# Patient Record
Sex: Male | Born: 1955 | Race: White | Hispanic: No | Marital: Married | Smoking: Former smoker
Health system: Southern US, Community
[De-identification: ages and names within clinical notes are randomized; demographics above are authoritative.]

## PROBLEM LIST (undated history)

## (undated) DIAGNOSIS — F319 Bipolar disorder, unspecified: Secondary | ICD-10-CM

## (undated) DIAGNOSIS — F329 Major depressive disorder, single episode, unspecified: Secondary | ICD-10-CM

## (undated) DIAGNOSIS — C029 Malignant neoplasm of tongue, unspecified: Secondary | ICD-10-CM

## (undated) DIAGNOSIS — F32A Depression, unspecified: Secondary | ICD-10-CM

## (undated) DIAGNOSIS — Z87898 Personal history of other specified conditions: Secondary | ICD-10-CM

## (undated) DIAGNOSIS — C61 Malignant neoplasm of prostate: Secondary | ICD-10-CM

## (undated) DIAGNOSIS — I7771 Dissection of carotid artery: Principal | ICD-10-CM

## (undated) DIAGNOSIS — R7309 Other abnormal glucose: Secondary | ICD-10-CM

## (undated) DIAGNOSIS — E039 Hypothyroidism, unspecified: Secondary | ICD-10-CM

## (undated) DIAGNOSIS — N400 Enlarged prostate without lower urinary tract symptoms: Secondary | ICD-10-CM

## (undated) DIAGNOSIS — K219 Gastro-esophageal reflux disease without esophagitis: Secondary | ICD-10-CM

## (undated) DIAGNOSIS — R51 Headache: Secondary | ICD-10-CM

## (undated) DIAGNOSIS — E785 Hyperlipidemia, unspecified: Secondary | ICD-10-CM

## (undated) DIAGNOSIS — G902 Horner's syndrome: Secondary | ICD-10-CM

## (undated) DIAGNOSIS — T7840XA Allergy, unspecified, initial encounter: Secondary | ICD-10-CM

## (undated) HISTORY — PX: OTHER SURGICAL HISTORY: SHX169

## (undated) HISTORY — DX: Other abnormal glucose: R73.09

## (undated) HISTORY — DX: Benign prostatic hyperplasia without lower urinary tract symptoms: N40.0

## (undated) HISTORY — DX: Malignant neoplasm of prostate: C61

## (undated) HISTORY — PX: PROSTATE BIOPSY: SHX241

## (undated) HISTORY — PX: EXCISION OF TONGUE LESION: SHX6434

## (undated) HISTORY — DX: Hyperlipidemia, unspecified: E78.5

## (undated) HISTORY — DX: Major depressive disorder, single episode, unspecified: F32.9

## (undated) HISTORY — DX: Headache: R51

## (undated) HISTORY — DX: Depression, unspecified: F32.A

## (undated) HISTORY — DX: Hypothyroidism, unspecified: E03.9

## (undated) HISTORY — DX: Allergy, unspecified, initial encounter: T78.40XA

## (undated) HISTORY — DX: Dissection of carotid artery: I77.71

## (undated) HISTORY — DX: Gastro-esophageal reflux disease without esophagitis: K21.9

## (undated) HISTORY — DX: Horner's syndrome: G90.2

## (undated) HISTORY — PX: COLONOSCOPY: SHX174

---

## 2000-10-21 ENCOUNTER — Emergency Department (HOSPITAL_COMMUNITY): Admission: EM | Admit: 2000-10-21 | Discharge: 2000-10-21 | Payer: Self-pay | Admitting: Emergency Medicine

## 2004-07-25 ENCOUNTER — Ambulatory Visit: Payer: Self-pay | Admitting: Internal Medicine

## 2004-08-01 ENCOUNTER — Ambulatory Visit: Payer: Self-pay | Admitting: Internal Medicine

## 2005-08-05 ENCOUNTER — Ambulatory Visit: Payer: Self-pay | Admitting: Internal Medicine

## 2005-08-11 ENCOUNTER — Ambulatory Visit: Payer: Self-pay | Admitting: Internal Medicine

## 2006-01-27 ENCOUNTER — Ambulatory Visit: Payer: Self-pay | Admitting: Internal Medicine

## 2006-02-02 ENCOUNTER — Ambulatory Visit: Payer: Self-pay | Admitting: Internal Medicine

## 2006-08-05 ENCOUNTER — Ambulatory Visit: Payer: Self-pay | Admitting: Internal Medicine

## 2006-08-05 LAB — CONVERTED CEMR LAB
ALT: 21 units/L (ref 0–40)
AST: 26 units/L (ref 0–37)
Alkaline Phosphatase: 50 units/L (ref 39–117)
Basophils Relative: 1.2 % — ABNORMAL HIGH (ref 0.0–1.0)
Bilirubin Urine: NEGATIVE
Chloride: 105 meq/L (ref 96–112)
Eosinophils Relative: 2.1 % (ref 0.0–5.0)
GFR calc Af Amer: 115 mL/min
GFR calc non Af Amer: 95 mL/min
HCT: 47.4 % (ref 39.0–52.0)
HDL: 34.1 mg/dL — ABNORMAL LOW (ref >39.0)
Hemoglobin, Urine: NEGATIVE
Leukocytes, UA: NEGATIVE
MCV: 94.1 fL (ref 78.0–100.0)
Monocytes Absolute: 0.5 10*3/uL (ref 0.2–0.7)
PSA: 2.33 ng/mL (ref 0.10–4.00)
Potassium: 4.5 meq/L (ref 3.5–5.1)
RBC: 5.04 M/uL (ref 4.22–5.81)
RDW: 11.7 % (ref 11.5–14.6)
Sodium: 139 meq/L (ref 135–145)
Specific Gravity, Urine: 1.015 (ref 1.000–1.03)
Total Protein, Urine: NEGATIVE mg/dL
Total Protein: 6.7 g/dL (ref 6.0–8.3)
Triglycerides: 141 mg/dL (ref 0–149)
WBC: 5.1 10*3/uL (ref 4.5–10.5)

## 2006-08-12 ENCOUNTER — Ambulatory Visit: Payer: Self-pay | Admitting: Internal Medicine

## 2007-01-29 ENCOUNTER — Ambulatory Visit: Payer: Self-pay | Admitting: Internal Medicine

## 2007-01-29 LAB — CONVERTED CEMR LAB
ALT: 19 units/L (ref 0–53)
Alkaline Phosphatase: 57 units/L (ref 39–117)
BUN: 11 mg/dL (ref 6–23)
Calcium: 9.5 mg/dL (ref 8.4–10.5)
GFR calc Af Amer: 132 mL/min
GFR calc non Af Amer: 109 mL/min
PSA: 5.15 ng/mL — ABNORMAL HIGH (ref 0.10–4.00)
Potassium: 4.4 meq/L (ref 3.5–5.1)
TSH: 2.56 microintl units/mL (ref 0.35–5.50)

## 2007-02-10 ENCOUNTER — Ambulatory Visit: Payer: Self-pay | Admitting: Internal Medicine

## 2007-08-06 ENCOUNTER — Ambulatory Visit: Payer: Self-pay | Admitting: Internal Medicine

## 2007-08-07 LAB — CONVERTED CEMR LAB
Basophils Relative: 0.5 % (ref 0.0–1.0)
Bilirubin Urine: NEGATIVE
Bilirubin, Direct: 0.2 mg/dL (ref 0.0–0.3)
CO2: 28 meq/L (ref 19–32)
Calcium: 9.9 mg/dL (ref 8.4–10.5)
Cholesterol: 153 mg/dL (ref 0–200)
Eosinophils Absolute: 0.2 10*3/uL (ref 0.0–0.6)
Eosinophils Relative: 3.6 % (ref 0.0–5.0)
GFR calc Af Amer: 132 mL/min
GFR calc non Af Amer: 109 mL/min
Glucose, Bld: 115 mg/dL — ABNORMAL HIGH (ref 70–99)
Hemoglobin: 17 g/dL (ref 13.0–17.0)
Leukocytes, UA: NEGATIVE
Lymphocytes Relative: 33.1 % (ref 12.0–46.0)
MCV: 91.4 fL (ref 78.0–100.0)
Monocytes Absolute: 0.4 10*3/uL (ref 0.2–0.7)
Neutro Abs: 3 10*3/uL (ref 1.4–7.7)
Nitrite: NEGATIVE
PSA: 2.38 ng/mL (ref 0.10–4.00)
Platelets: 204 10*3/uL (ref 150–400)
Potassium: 4.2 meq/L (ref 3.5–5.1)
Sodium: 139 meq/L (ref 135–145)
Specific Gravity, Urine: 1.01 (ref 1.000–1.03)
TSH: 1.61 microintl units/mL (ref 0.35–5.50)
Total CHOL/HDL Ratio: 5
Total Protein: 6.8 g/dL (ref 6.0–8.3)
Triglycerides: 173 mg/dL — ABNORMAL HIGH (ref 0–149)
Urine Glucose: NEGATIVE mg/dL
WBC: 5.4 10*3/uL (ref 4.5–10.5)

## 2007-08-10 ENCOUNTER — Ambulatory Visit: Payer: Self-pay | Admitting: Internal Medicine

## 2007-08-10 DIAGNOSIS — N400 Enlarged prostate without lower urinary tract symptoms: Secondary | ICD-10-CM | POA: Insufficient documentation

## 2007-08-10 DIAGNOSIS — J069 Acute upper respiratory infection, unspecified: Secondary | ICD-10-CM | POA: Insufficient documentation

## 2007-08-10 DIAGNOSIS — E039 Hypothyroidism, unspecified: Secondary | ICD-10-CM | POA: Insufficient documentation

## 2007-08-10 DIAGNOSIS — F319 Bipolar disorder, unspecified: Secondary | ICD-10-CM | POA: Insufficient documentation

## 2007-08-10 DIAGNOSIS — J309 Allergic rhinitis, unspecified: Secondary | ICD-10-CM | POA: Insufficient documentation

## 2007-08-10 DIAGNOSIS — E785 Hyperlipidemia, unspecified: Secondary | ICD-10-CM | POA: Insufficient documentation

## 2007-09-21 ENCOUNTER — Encounter: Payer: Self-pay | Admitting: Internal Medicine

## 2007-12-24 ENCOUNTER — Telehealth: Payer: Self-pay | Admitting: Internal Medicine

## 2008-02-01 ENCOUNTER — Ambulatory Visit: Payer: Self-pay | Admitting: Internal Medicine

## 2008-02-01 LAB — CONVERTED CEMR LAB
ALT: 14 units/L (ref 0–53)
AST: 27 units/L (ref 0–37)
Albumin: 4.6 g/dL (ref 3.5–5.2)
Alkaline Phosphatase: 71 units/L (ref 39–117)
BUN: 11 mg/dL (ref 6–23)
CO2: 29 meq/L (ref 19–32)
Chloride: 105 meq/L (ref 96–112)
Cholesterol: 125 mg/dL (ref 0–200)
Creatinine, Ser: 1 mg/dL (ref 0.4–1.5)
GFR calc non Af Amer: 84 mL/min
Glucose, Bld: 111 mg/dL — ABNORMAL HIGH (ref 70–99)
LDL Cholesterol: 77 mg/dL (ref 0–99)
Potassium: 4.8 meq/L (ref 3.5–5.1)
Triglycerides: 49 mg/dL (ref 0–149)

## 2008-02-07 ENCOUNTER — Ambulatory Visit: Payer: Self-pay | Admitting: Internal Medicine

## 2008-07-19 ENCOUNTER — Encounter: Payer: Self-pay | Admitting: Internal Medicine

## 2008-08-02 ENCOUNTER — Ambulatory Visit: Payer: Self-pay | Admitting: Internal Medicine

## 2008-08-03 LAB — CONVERTED CEMR LAB
ALT: 18 units/L (ref 0–53)
Alkaline Phosphatase: 63 units/L (ref 39–117)
Bilirubin Urine: NEGATIVE
Bilirubin, Direct: 0.1 mg/dL (ref 0.0–0.3)
CO2: 29 meq/L (ref 19–32)
Chloride: 109 meq/L (ref 96–112)
GFR calc Af Amer: 101 mL/min
Glucose, Bld: 114 mg/dL — ABNORMAL HIGH (ref 70–99)
HDL: 35.1 mg/dL — ABNORMAL LOW (ref >39.0)
Leukocytes, UA: NEGATIVE
Lymphocytes Relative: 22.1 % (ref 12.0–46.0)
Monocytes Relative: 7.9 % (ref 3.0–12.0)
Mucus, UA: NEGATIVE
Nitrite: NEGATIVE
Platelets: 161 10*3/uL (ref 150–400)
Potassium: 4.9 meq/L (ref 3.5–5.1)
RDW: 11.6 % (ref 11.5–14.6)
Sodium: 141 meq/L (ref 135–145)
Squamous Epithelial / LPF: NEGATIVE /lpf
Total CHOL/HDL Ratio: 3.9
Total Protein: 6.6 g/dL (ref 6.0–8.3)
VLDL: 15 mg/dL (ref 0–40)
WBC, UA: NONE SEEN cells/hpf
WBC: 3.6 10*3/uL — ABNORMAL LOW (ref 4.5–10.5)
pH: 6 (ref 5.0–8.0)

## 2008-08-08 ENCOUNTER — Ambulatory Visit: Payer: Self-pay | Admitting: Internal Medicine

## 2008-08-25 ENCOUNTER — Telehealth: Payer: Self-pay | Admitting: Internal Medicine

## 2008-09-01 ENCOUNTER — Telehealth: Payer: Self-pay | Admitting: Internal Medicine

## 2008-10-17 ENCOUNTER — Encounter: Payer: Self-pay | Admitting: Internal Medicine

## 2008-11-03 ENCOUNTER — Ambulatory Visit: Payer: Self-pay | Admitting: Internal Medicine

## 2008-11-03 LAB — CONVERTED CEMR LAB
BUN: 12 mg/dL (ref 6–23)
Basophils Absolute: 0 K/uL (ref 0.0–0.1)
Basophils Relative: 0.6 % (ref 0.0–3.0)
CO2: 27 meq/L (ref 19–32)
Calcium: 9.4 mg/dL (ref 8.4–10.5)
Chloride: 112 meq/L (ref 96–112)
Creatinine, Ser: 0.7 mg/dL (ref 0.4–1.5)
Eosinophils Absolute: 0.2 K/uL (ref 0.0–0.7)
Eosinophils Relative: 5.3 % — ABNORMAL HIGH (ref 0.0–5.0)
GFR calc non Af Amer: 126.09 mL/min (ref >60)
Glucose, Bld: 92 mg/dL (ref 70–99)
HCT: 46.8 % (ref 39.0–52.0)
Hemoglobin: 16.6 g/dL (ref 13.0–17.0)
Hgb A1c MFr Bld: 5.3 % (ref 4.6–6.5)
Lymphocytes Relative: 27.4 % (ref 12.0–46.0)
Lymphs Abs: 1.2 K/uL (ref 0.7–4.0)
MCHC: 35.4 g/dL (ref 30.0–36.0)
MCV: 93.9 fL (ref 78.0–100.0)
Monocytes Absolute: 0.4 K/uL (ref 0.1–1.0)
Monocytes Relative: 9.2 % (ref 3.0–12.0)
Neutro Abs: 2.5 K/uL (ref 1.4–7.7)
Neutrophils Relative %: 57.5 % (ref 43.0–77.0)
Platelets: 179 K/uL (ref 150.0–400.0)
Potassium: 3.9 meq/L (ref 3.5–5.1)
RBC: 4.99 M/uL (ref 4.22–5.81)
RDW: 11.5 % (ref 11.5–14.6)
Sodium: 144 meq/L (ref 135–145)
TSH: 3.83 u[IU]/mL (ref 0.35–5.50)
WBC: 4.3 10*3/microliter — ABNORMAL LOW (ref 4.5–10.5)

## 2008-11-08 ENCOUNTER — Ambulatory Visit: Payer: Self-pay | Admitting: Internal Medicine

## 2009-01-11 ENCOUNTER — Encounter: Payer: Self-pay | Admitting: Internal Medicine

## 2009-03-02 ENCOUNTER — Ambulatory Visit: Payer: Self-pay | Admitting: Internal Medicine

## 2009-03-02 LAB — CONVERTED CEMR LAB
BUN: 12 mg/dL (ref 6–23)
Calcium: 9.4 mg/dL (ref 8.4–10.5)
Creatinine, Ser: 0.8 mg/dL (ref 0.4–1.5)
GFR calc non Af Amer: 107.94 mL/min (ref >60)
Glucose, Bld: 101 mg/dL — ABNORMAL HIGH (ref 70–99)
Total CHOL/HDL Ratio: 5

## 2009-03-07 ENCOUNTER — Ambulatory Visit: Payer: Self-pay | Admitting: Internal Medicine

## 2009-03-07 DIAGNOSIS — R498 Other voice and resonance disorders: Secondary | ICD-10-CM | POA: Insufficient documentation

## 2009-03-28 ENCOUNTER — Ambulatory Visit: Payer: Self-pay | Admitting: Internal Medicine

## 2009-03-28 DIAGNOSIS — R194 Change in bowel habit: Secondary | ICD-10-CM | POA: Insufficient documentation

## 2009-03-28 DIAGNOSIS — R197 Diarrhea, unspecified: Secondary | ICD-10-CM | POA: Insufficient documentation

## 2009-05-23 ENCOUNTER — Telehealth: Payer: Self-pay | Admitting: Internal Medicine

## 2009-07-14 HISTORY — PX: OTHER SURGICAL HISTORY: SHX169

## 2009-07-16 ENCOUNTER — Encounter: Payer: Self-pay | Admitting: Internal Medicine

## 2009-08-29 ENCOUNTER — Encounter: Payer: Self-pay | Admitting: Internal Medicine

## 2009-09-05 ENCOUNTER — Ambulatory Visit: Payer: Self-pay | Admitting: Internal Medicine

## 2009-09-07 LAB — CONVERTED CEMR LAB: Vitamin B-12: 563 pg/mL (ref 211–911)

## 2009-11-21 ENCOUNTER — Encounter: Payer: Self-pay | Admitting: Internal Medicine

## 2010-01-02 ENCOUNTER — Encounter: Payer: Self-pay | Admitting: Internal Medicine

## 2010-01-10 ENCOUNTER — Telehealth: Payer: Self-pay | Admitting: Internal Medicine

## 2010-01-29 ENCOUNTER — Encounter: Payer: Self-pay | Admitting: Internal Medicine

## 2010-02-26 ENCOUNTER — Ambulatory Visit: Payer: Self-pay | Admitting: Internal Medicine

## 2010-02-27 LAB — CONVERTED CEMR LAB
Albumin: 4.9 g/dL (ref 3.5–5.2)
Alkaline Phosphatase: 73 units/L (ref 39–117)
Basophils Absolute: 0 10*3/uL (ref 0.0–0.1)
Bilirubin Urine: NEGATIVE
CO2: 30 meq/L (ref 19–32)
Calcium: 9.7 mg/dL (ref 8.4–10.5)
Cholesterol: 232 mg/dL — ABNORMAL HIGH (ref 0–200)
Creatinine, Ser: 0.8 mg/dL (ref 0.4–1.5)
Direct LDL: 168.1 mg/dL
Eosinophils Absolute: 0.1 10*3/uL (ref 0.0–0.7)
Glucose, Bld: 95 mg/dL (ref 70–99)
Hemoglobin, Urine: NEGATIVE
Hemoglobin: 16.7 g/dL (ref 13.0–17.0)
Leukocytes, UA: NEGATIVE
Lymphocytes Relative: 28.3 % (ref 12.0–46.0)
MCHC: 35.2 g/dL (ref 30.0–36.0)
MCV: 94.9 fL (ref 78.0–100.0)
Monocytes Absolute: 0.4 10*3/uL (ref 0.1–1.0)
Neutro Abs: 2.9 10*3/uL (ref 1.4–7.7)
Neutrophils Relative %: 61 % (ref 43.0–77.0)
Nitrite: NEGATIVE
PSA: 6.5 ng/mL — ABNORMAL HIGH (ref 0.10–4.00)
RDW: 12.6 % (ref 11.5–14.6)
Total Protein, Urine: NEGATIVE mg/dL
Triglycerides: 107 mg/dL (ref 0.0–149.0)
Urobilinogen, UA: 0.2 (ref 0.0–1.0)

## 2010-03-06 ENCOUNTER — Ambulatory Visit: Payer: Self-pay | Admitting: Internal Medicine

## 2010-03-06 ENCOUNTER — Encounter: Payer: Self-pay | Admitting: Internal Medicine

## 2010-03-06 DIAGNOSIS — M25511 Pain in right shoulder: Secondary | ICD-10-CM | POA: Insufficient documentation

## 2010-03-07 ENCOUNTER — Telehealth: Payer: Self-pay | Admitting: Internal Medicine

## 2010-04-08 ENCOUNTER — Ambulatory Visit: Payer: Self-pay | Admitting: Internal Medicine

## 2010-04-08 DIAGNOSIS — M25579 Pain in unspecified ankle and joints of unspecified foot: Secondary | ICD-10-CM | POA: Insufficient documentation

## 2010-04-08 DIAGNOSIS — R609 Edema, unspecified: Secondary | ICD-10-CM | POA: Insufficient documentation

## 2010-04-09 LAB — CONVERTED CEMR LAB
BUN: 8 mg/dL (ref 6–23)
Glucose, Bld: 109 mg/dL — ABNORMAL HIGH (ref 70–99)
Ketones, ur: NEGATIVE mg/dL
Leukocytes, UA: NEGATIVE
Specific Gravity, Urine: 1.01 (ref 1.000–1.030)
TSH: 0.93 microintl units/mL (ref 0.35–5.50)
Uric Acid, Serum: 4.3 mg/dL (ref 4.0–7.8)
Urobilinogen, UA: 0.2 (ref 0.0–1.0)

## 2010-04-22 ENCOUNTER — Encounter: Payer: Self-pay | Admitting: Internal Medicine

## 2010-05-01 ENCOUNTER — Encounter: Payer: Self-pay | Admitting: Internal Medicine

## 2010-07-01 ENCOUNTER — Encounter: Payer: Self-pay | Admitting: Internal Medicine

## 2010-07-05 ENCOUNTER — Telehealth: Payer: Self-pay | Admitting: Internal Medicine

## 2010-07-10 ENCOUNTER — Ambulatory Visit: Payer: Self-pay | Admitting: Internal Medicine

## 2010-07-10 DIAGNOSIS — R972 Elevated prostate specific antigen [PSA]: Secondary | ICD-10-CM

## 2010-07-31 ENCOUNTER — Telehealth: Payer: Self-pay | Admitting: Internal Medicine

## 2010-08-01 ENCOUNTER — Telehealth: Payer: Self-pay | Admitting: Internal Medicine

## 2010-08-13 NOTE — Letter (Signed)
Summary: Voice Disorders/[facility]  Voice Disorders/[facility]   Imported By: Lester [place]boro 05/16/2010 08:54:44  _____________________________________________________________________  External Attachment:    Type:   Image     Comment:   External Document

## 2010-08-13 NOTE — Letter (Signed)
Summary: [facility]   Imported By: Lester Edgar 07/27/2009 08:32:09  _____________________________________________________________________  External Attachment:    Type:   Image     Comment:   External Document

## 2010-08-13 NOTE — Letter (Signed)
Summary: [facility]   Imported By: Lester Coyanosa 05/15/2010 10:46:42  _____________________________________________________________________  External Attachment:    Type:   Image     Comment:   External Document

## 2010-08-13 NOTE — Letter (Signed)
Summary: [facility]  [facility]   Imported By: Sherian Rein 02/12/2010 14:09:31  _____________________________________________________________________  External Attachment:    Type:   Image     Comment:   External Document

## 2010-08-13 NOTE — Letter (Signed)
Summary: Voice Eval/[facility]  Voice Eval/[facility]   Imported By: Sherian Rein 03/12/2010 13:39:32  _____________________________________________________________________  External Attachment:    Type:   Image     Comment:   External Document

## 2010-08-13 NOTE — Letter (Signed)
Summary: [facility]   Imported By: Sherian Rein 03/12/2010 13:31:49  _____________________________________________________________________  External Attachment:    Type:   Image     Comment:   External Document

## 2010-08-13 NOTE — Assessment & Plan Note (Signed)
Summary: 6 mos well w/labs/#/cd   Vital Signs:  Patient profile:   55 year old male Height:      73 inches Weight:      172 pounds BMI:     22.77 Temp:     98.0 degrees F oral Pulse rate:   84 / minute Pulse rhythm:   regular Resp:     16 per minute BP sitting:   130 / 80  (left arm) Cuff size:   regular  Vitals Entered By: Lanier Prude, Beverly Gust) (March 06, 2010 3:06 PM) CC: CPX Is Patient Diabetic? No Comments pt is not taking Depakote, Flax Seed oil or Ambien.  Please remove from list   Primary Care Provider:  Tresa Garter MD  CC:  CPX.  History of Present Illness: The patient presents for a follow up of hypertension, diabetes, hyperlipidemia The patient presents for a preventive health examination   Preventive Screening-Counseling & Management  Caffeine-Diet-Exercise     Does Patient Exercise: yes  Current Medications (verified): 1)  Levothroid 50 Mcg Tabs (Levothyroxine Sodium) .... Take 1 Tab By Mouth Daily 2)  Aspir-Low 81 Mg Tbec (Aspirin) .Marland Kitchen.. 1 Once Daily After Meal 3)  Multi-Vitamin  Tabs (Multiple Vitamin) .... Once Daily 4)  Veramyst 27.5 Mcg/spray Susp (Fluticasone Furoate) .Marland Kitchen.. 1 Spr Each Nostr Qd 5)  Depakote 250 Mg Tbec (Divalproex Sodium) .... Three Times A Day 6)  Flax Seeds  Powd (Flaxseed (Linseed)) .Marland Kitchen.. 1  Tbsp Once Daily 7)  Singulair 10 Mg Tabs (Montelukast Sodium) .Marland Kitchen.. 1 By Mouth Daily 8)  Vitamin D3 1000 Unit  Tabs (Cholecalciferol) .Marland Kitchen.. 1 By Mouth Daily 9)  Welchol 625 Mg Tabs (Colesevelam Hcl) .Marland Kitchen.. 1-2 As Needed Diarrhea 10)  Ambien Cr 12.5 Mg Cr-Tabs (Zolpidem Tartrate) .... At Bedtime 11)  Wellbutrin Xl 150 Mg Xr24h-Tab (Bupropion Hcl) .... 2  Every Morning 12)  Amantadine Hcl 100 Mg Tabs (Amantadine Hcl) .Marland Kitchen.. 1 Once Daily 13)  Lamictal 200 Mg Tabs (Lamotrigine) .... At Bedtime 14)  Klonopin 2 Mg Tabs (Clonazepam) .... At Bedtime 15)  Zaleplon 5 Mg Caps (Zaleplon) .... At Bedtime  Allergies (verified): 1)  ! Keflex 2)  !  Prozac (Fluoxetine Hcl) 3)  Zithromax 4)  Toradol 5)  Erythromycin  Past History:  Past Surgical History: Last updated: 08/08/2008 Prostate bx 2010  Family History: Last updated: 08/10/2007 Family History Hypertension  Past Medical History: Hypothyroidism Benign prostatic hypertrophy, elev PSA resolved after Abx 2008 Dr Retta Diones (PSA 5.15 in 8/09) Depression, h/o bipolar Dr Robley Fries at [facility] Hyperlipidemia Elev. glu Allergic rhinitis  Social History: Occupation:  Married Never Smoked Regular exercise-yes treadmill Does Patient Exercise:  yes  Review of Systems       The patient complains of depression.  The patient denies anorexia, fever, weight loss, weight gain, vision loss, decreased hearing, hoarseness, chest pain, syncope, dyspnea on exertion, peripheral edema, prolonged cough, headaches, hemoptysis, abdominal pain, melena, hematochezia, severe indigestion/heartburn, hematuria, incontinence, genital sores, muscle weakness, suspicious skin lesions, transient blindness, difficulty walking, unusual weight change, abnormal bleeding, enlarged lymph nodes, angioedema, and breast masses.         hoarse  Physical Exam  General:  Well-developed,well-nourished,in no acute distress; alert,appropriate and cooperative throughout examination Hoarse Head:  Normocephalic and atraumatic without obvious abnormalities. No apparent alopecia or balding. Eyes:  No corneal or conjunctival inflammation noted. EOMI. Perrla.  Ears:  External ear exam shows no significant lesions or deformities.  Otoscopic examination reveals clear canals, tympanic membranes are intact  bilaterally without bulging, retraction, inflammation or discharge. Hearing is grossly normal bilaterally. Nose:  External nasal examination shows no deformity or inflammation. Nasal mucosa are pink and moist without lesions or exudates. Mouth:  WNL Hoarse Neck:  No deformities, masses, or tenderness noted. Chest Wall:  No  deformities, masses, tenderness or gynecomastia noted. Lungs:  Normal respiratory effort, chest expands symmetrically. Lungs are clear to auscultation, no crackles or wheezes. Heart:  Normal rate and regular rhythm. S1 and S2 normal without gallop, murmur, click, rub or other extra sounds. Abdomen:  Bowel sounds positive,abdomen soft and non-tender without masses, organomegaly or hernias noted. Rectal:  No external abnormalities noted. Normal sphincter tone. No rectal masses or tenderness. Genitalia:  Testes bilaterally descended without nodularity, tenderness or masses. No scrotal masses or lesions. No penis lesions or urethral discharge. Prostate:  Prostate gland firm and smooth, no enlargement, nodularity, tenderness, mass, asymmetry or induration. Msk:  No deformity or scoliosis noted of thoracic or lumbar spine.   Pulses:  R and L carotid,radial,femoral,dorsalis pedis and posterior tibial pulses are full and equal bilaterally Extremities:  No clubbing, cyanosis, edema, or deformity noted with normal full range of motion of all joints.   Neurologic:  No cranial nerve deficits noted. Station and gait are normal. Plantar reflexes are down-going bilaterally. DTRs are symmetrical throughout. Sensory, motor and coordinative functions appear intact. Skin:  Intact without suspicious lesions or rashes Cervical Nodes:  No lymphadenopathy noted Inguinal Nodes:  No significant adenopathy Psych:  Oriented X3, memory weak for recent, normally interactive, and good eye contact.     Impression & Recommendations:  Problem # 1:  ROUTINE GENERAL MEDICAL EXAM@HEALTH  CARE FACL (ICD-V70.0) Assessment Unchanged Health and age related issues were discussed. Available screening tests and vaccinations were discussed as well. Healthy life style including good diet and exercise was discussed.  The labs were reviewed with the patient.  Orders: EKG w/ Interpretation (93000) WNL  Problem # 2:  DEPRESSION  (ICD-311) Assessment: Improved  His updated medication list for this problem includes:    Wellbutrin Xl 150 Mg Xr24h-tab (Bupropion hcl) .Marland Kitchen... 2  every morning    Klonopin 2 Mg Tabs (Clonazepam) .Marland Kitchen... At bedtime  Problem # 3:  ALLERGIC RHINITIS (ICD-477.9) Assessment: Unchanged  His updated medication list for this problem includes:    Veramyst 27.5 Mcg/spray Susp (Fluticasone furoate) .Marland Kitchen... 1 spr each nostr qd  Problem # 4:  HOARSENESS (TDD-220.25) Assessment: Unchanged Surgery is pending   Problem # 5:  HYPERLIPIDEMIA (ICD-272.4) Assessment: Unchanged  His updated medication list for this problem includes:    Welchol 625 Mg Tabs (Colesevelam hcl) .Marland Kitchen... 1-2 as needed diarrhea  Problem # 6:  SHOULDER PAIN (ICD-719.41) R>L Assessment: Unchanged  His updated medication list for this problem includes:    Aspir-low 81 Mg Tbec (Aspirin) .Marland Kitchen... 1 once daily after meal    Vimovo 500-20 Mg Tbec (Naproxen-esomeprazole) .Marland Kitchen... 1 by mouth once daily - two times a day pc as needed pain  Complete Medication List: 1)  Levothroid 50 Mcg Tabs (Levothyroxine sodium) .... Take 1 tab by mouth daily 2)  Aspir-low 81 Mg Tbec (Aspirin) .Marland Kitchen.. 1 once daily after meal 3)  Multi-vitamin Tabs (Multiple vitamin) .... Once daily 4)  Veramyst 27.5 Mcg/spray Susp (Fluticasone furoate) .Marland Kitchen.. 1 spr each nostr qd 5)  Flax Seeds Powd (Flaxseed (linseed)) .Marland Kitchen.. 1  tbsp once daily 6)  Singulair 10 Mg Tabs (Montelukast sodium) .Marland Kitchen.. 1 by mouth daily 7)  Vitamin D3 1000 Unit Tabs (Cholecalciferol) .Marland KitchenMarland KitchenMarland Kitchen  1 by mouth daily 8)  Welchol 625 Mg Tabs (Colesevelam hcl) .Marland Kitchen.. 1-2 as needed diarrhea 9)  Wellbutrin Xl 150 Mg Xr24h-tab (Bupropion hcl) .... 2  every morning 10)  Amantadine Hcl 100 Mg Tabs (Amantadine hcl) .Marland Kitchen.. 1 once daily 11)  Lamictal 200 Mg Tabs (Lamotrigine) .... At bedtime 12)  Klonopin 2 Mg Tabs (Clonazepam) .... At bedtime 13)  Zaleplon 5 Mg Caps (Zaleplon) .... At bedtime 14)  Vimovo 500-20 Mg Tbec  (Naproxen-esomeprazole) .Marland Kitchen.. 1 by mouth once daily - two times a day pc as needed pain 15)  Levaquin 500 Mg Tabs (Levofloxacin) .Marland Kitchen.. 1 by mouth qd  Other Orders: Admin 1st Vaccine (62952) Flu Vaccine 28yrs + (84132)  Patient Instructions: 1)  Countor pillow 2)  Use stretching and balance exercises that I have provided (15 min. or longer every day) 3)  Please schedule a follow-up appointment in 4 months. Prescriptions: VIMOVO 500-20 MG TBEC (NAPROXEN-ESOMEPRAZOLE) 1 by mouth once daily - two times a day pc as needed pain  #60 x 3   Entered and Authorized by:   Tresa Garter MD   Signed by:   Tresa Garter MD on 03/06/2010   Method used:   Print then Give to Patient   RxID:   [id number] ZITHROMAX Z-PAK 250 MG TABS (AZITHROMYCIN) as dirrected  #1 x 0   Entered and Authorized by:   Tresa Garter MD   Signed by:   Tresa Garter MD on 03/06/2010   Method used:   Electronically to        [facility]. # [phone]* (retail)       [address]       [place], [place]  [address]       Ph: [phone]       Fax: [phone]   RxID:   [id number] VERAMYST 27.5 MCG/SPRAY SUSP (FLUTICASONE FUROATE) 1 spr each nostr qd  #3 x 2   Entered and Authorized by:   Tresa Garter MD   Signed by:   Tresa Garter MD on 03/06/2010   Method used:   Electronically to        [facility]. # [phone]* (retail)       [address]       [place], [place]  [address]       Ph: [phone]       Fax: [phone]   RxID:   [id number]   .lbflu    Flu Vaccine Consent Questions     Do you have a history of severe allergic reactions to this vaccine? no    Any prior history of allergic reactions to egg and/or gelatin? no    Do you have a sensitivity to the preservative Thimersol? no    Do you have a past history of Guillan-Barre Syndrome? no    Do you currently have an acute febrile illness? no    Have you ever had a severe  reaction to latex? no    Vaccine information given and explained to patient? yes    Are you currently pregnant? no    Lot Number:[id number]   Exp Date:01/11/2011   Site Given  Left Deltoid IM Lanier Prude, [facility])  March 06, 2010 4:13 PM

## 2010-08-13 NOTE — Consult Note (Signed)
Summary: [facility]  [facility]   Imported By: Lennie Odor 12/18/2009 15:32:23  _____________________________________________________________________  External Attachment:    Type:   Image     Comment:   External Document

## 2010-08-13 NOTE — Assessment & Plan Note (Signed)
Summary: 6 MO ROV /NWS $50   Vital Signs:  Patient profile:   55 year old male Weight:      184 pounds BMI:     24.36 Temp:     98.4 degrees F oral Pulse rate:   91 / minute BP sitting:   110 / 80  (left arm)  Vitals Entered By: Tora Perches (September 05, 2009 2:31 PM) CC: f/u Is Patient Diabetic? No   Primary Care Provider:  Tresa Garter MD  CC:  f/u.  History of Present Illness: The patient presents for a follow up of hypothyroidism, anxiety, bipolar depression and allergies.   Preventive Screening-Counseling & Management  Alcohol-Tobacco     Smoking Status: never  Current Medications (verified): 1)  Levothroid 50 Mcg Tabs (Levothyroxine Sodium) .... Take 1 Tab By Mouth Daily 2)  Aspir-Low 81 Mg Tbec (Aspirin) .Marland Kitchen.. 1 Once Daily After Meal 3)  Multi-Vitamin  Tabs (Multiple Vitamin) .... Once Daily 4)  Veramyst 27.5 Mcg/spray Susp (Fluticasone Furoate) .Marland Kitchen.. 1 Spr Each Nostr Qd 5)  Depakote 250 Mg Tbec (Divalproex Sodium) .... Three Times A Day 6)  Flax Seeds  Powd (Flaxseed (Linseed)) .Marland Kitchen.. 1  Tbsp Once Daily 7)  Singulair 10 Mg Tabs (Montelukast Sodium) .Marland Kitchen.. 1 By Mouth Daily 8)  Vitamin D3 1000 Unit  Tabs (Cholecalciferol) .Marland Kitchen.. 1 By Mouth Daily 9)  Welchol 625 Mg Tabs (Colesevelam Hcl) .Marland Kitchen.. 1-2 As Needed Diarrhea 10)  Ambien Cr 12.5 Mg Cr-Tabs (Zolpidem Tartrate) .... At Bedtime  Allergies: 1)  ! Keflex 2)  ! Prozac (Fluoxetine Hcl) 3)  Zithromax 4)  Toradol 5)  Erythromycin  Past History:  Past Medical History: Last updated: 11/08/2008 Hypothyroidism Benign prostatic hypertrophy, elev PSA resolved after Abx 2008 Dr Retta Diones (PSA 5.15 in 8/09) Depression, h/o bipolar Dr Donell Beers Hyperlipidemia Elev. glu Allergic rhinitis  Social History: Last updated: 03/07/2009 Occupation: changed jobs Married Never Smoked  Physical Exam  General:  Well-developed,well-nourished,in no acute distress; alert,appropriate and cooperative throughout  examination Hoarse Nose:  External nasal examination shows no deformity or inflammation. Nasal mucosa are pink and moist without lesions or exudates. Mouth:  WNL Neck:  No deformities, masses, or tenderness noted. Lungs:  Normal respiratory effort, chest expands symmetrically. Lungs are clear to auscultation, no crackles or wheezes. Heart:  Normal rate and regular rhythm. S1 and S2 normal without gallop, murmur, click, rub or other extra sounds. Abdomen:  Bowel sounds positive,abdomen soft and non-tender without masses, organomegaly or hernias noted. Msk:  No deformity or scoliosis noted of thoracic or lumbar spine.   Neurologic:  No cranial nerve deficits noted. Station and gait are normal. Plantar reflexes are down-going bilaterally. DTRs are symmetrical throughout. Sensory, motor and coordinative functions appear intact. Skin:  Intact without suspicious lesions or rashes Psych:  Oriented X3, memory weak for recent, normally interactive, and good eye contact.     Impression & Recommendations:  Problem # 1:  DEPRESSION (ICD-311) Assessment Unchanged  The following medications were removed from the medication list:    Zoloft 100 Mg Tabs (Sertraline hcl) .Marland Kitchen... 1 1/2 once daily  Problem # 2:  HOARSENESS (ACZ-660.63) Assessment: New  He had seen ENT  Orders: TLB-B12, Serum-Total ONLY (01601-U93) TLB-TSH (Thyroid Stimulating Hormone) (84443-TSH)  Problem # 3:  HYPERLIPIDEMIA (ICD-272.4) Assessment: Comment Only  His updated medication list for this problem includes:    Welchol 625 Mg Tabs (Colesevelam hcl) .Marland Kitchen... 1-2 as needed diarrhea  Orders: TLB-B12, Serum-Total ONLY (23557-D22) TLB-TSH (Thyroid Stimulating Hormone) (84443-TSH)  Problem # 4:  HYPOTHYROIDISM (ICD-244.9) Assessment: Unchanged  His updated medication list for this problem includes:    Levothroid 50 Mcg Tabs (Levothyroxine sodium) .Marland Kitchen... Take 1 tab by mouth daily  Orders: TLB-B12, Serum-Total ONLY  (16109-U04) TLB-TSH (Thyroid Stimulating Hormone) (84443-TSH)  Complete Medication List: 1)  Levothroid 50 Mcg Tabs (Levothyroxine sodium) .... Take 1 tab by mouth daily 2)  Aspir-low 81 Mg Tbec (Aspirin) .Marland Kitchen.. 1 once daily after meal 3)  Multi-vitamin Tabs (Multiple vitamin) .... Once daily 4)  Veramyst 27.5 Mcg/spray Susp (Fluticasone furoate) .Marland Kitchen.. 1 spr each nostr qd 5)  Depakote 250 Mg Tbec (Divalproex sodium) .... Three times a day 6)  Flax Seeds Powd (Flaxseed (linseed)) .Marland Kitchen.. 1  tbsp once daily 7)  Singulair 10 Mg Tabs (Montelukast sodium) .Marland Kitchen.. 1 by mouth daily 8)  Vitamin D3 1000 Unit Tabs (Cholecalciferol) .Marland Kitchen.. 1 by mouth daily 9)  Welchol 625 Mg Tabs (Colesevelam hcl) .Marland Kitchen.. 1-2 as needed diarrhea 10)  Ambien Cr 12.5 Mg Cr-tabs (Zolpidem tartrate) .... At bedtime  Other Orders: Admin 1st Vaccine (54098) Flu Vaccine 46yrs + (11914)  Patient Instructions: 1)  Please schedule a follow-up appointment in 6 months well w/labs and depakote level v70.0  995.20.   Influenza Vaccine (to be given today)   Flu Vaccine Consent Questions     Do you have a history of severe allergic reactions to this vaccine? no    Any prior history of allergic reactions to egg and/or gelatin? no    Do you have a sensitivity to the preservative Thimersol? no    Do you have a past history of Guillan-Barre Syndrome? no    Do you currently have an acute febrile illness? no    Have you ever had a severe reaction to latex? no    Vaccine information given and explained to patient? yes    Are you currently pregnant? no    Lot Number:[id number]   Exp Date:01/10/2010   Site Given  right Deltoid IMbflu

## 2010-08-13 NOTE — Progress Notes (Signed)
Summary: Change in rx-- lm 8/25  Phone Note Call from Patient Call back at Home Phone [phone]   Summary of Call: Pt remembered that he has severe diarrhea when he takes a zpak. His throat is worse and would like rx for different antibiotic at [facility] in [place].  Initial call taken by: Lamar Sprinkles, CMA,  March 07, 2010 11:50 AM  Follow-up for Phone Call        Levaquin x 7 d  Follow-up by: Tresa Garter MD,  March 07, 2010 1:34 PM  Additional Follow-up for Phone Call Additional follow up Details #1::        Received  call from patient pharmacy that there is severe interaction between Z-Pack and Levaquin. I called to advise the patient of that and the above, left message on machine to call back to office. Lucious Groves CMA  March 07, 2010 2:10 PM     Additional Follow-up for Phone Call Additional follow up Details #2::    Pt informed, zpak added to allergy list, pt will not be taking both Follow-up by: Lamar Sprinkles, CMA,  March 07, 2010 5:27 PM  New/Updated Medications: LEVAQUIN 500 MG TABS (LEVOFLOXACIN) 1 by mouth qd Prescriptions: LEVAQUIN 500 MG TABS (LEVOFLOXACIN) 1 by mouth qd  #7 x 0   Entered and Authorized by:   Tresa Garter MD   Signed by:   Tresa Garter MD on 03/07/2010   Method used:   Electronically to        [facility]. # [phone]* (retail)       [address]       [place], [place]  [address]       Ph: [phone]       Fax: [phone]   RxID:   [id number]

## 2010-08-13 NOTE — Progress Notes (Signed)
  Phone Note Refill Request   Refills Requested: Medication #1:  LEVOTHROID 50 MCG TABS Take 1 tab by mouth daily  Medication #2:  SINGULAIR 10 MG TABS 1 by mouth daily  Needs refills to go to [facility]  Initial call taken by: Lamar Sprinkles, CMA,  January 10, 2010 9:58 AM    Prescriptions: SINGULAIR 10 MG TABS (MONTELUKAST SODIUM) 1 by mouth daily  #90 x 2   Entered by:   Lamar Sprinkles, CMA   Authorized by:   Tresa Garter MD   Signed by:   Lamar Sprinkles, CMA on 01/10/2010   Method used:   Faxed to ...       [facility] (mail-order)             , [place]         Ph: [phone]       Fax: [phone]   RxID:   [id number] LEVOTHROID 50 MCG TABS (LEVOTHYROXINE SODIUM) Take 1 tab by mouth daily  #90 x 2   Entered by:   Lamar Sprinkles, CMA   Authorized by:   Tresa Garter MD   Signed by:   Lamar Sprinkles, CMA on 01/10/2010   Method used:   Faxed to ...       [facility] (mail-order)             , [place]         Ph: [phone]       Fax: [phone]   RxID:   [id number]

## 2010-08-13 NOTE — Assessment & Plan Note (Signed)
Summary: PER WIFE NXT WK APPT--ANKLES SWOLLEN--STC   Vital Signs:  Patient profile:   55 year old male Height:      73 inches Weight:      170 pounds BMI:     22.51 Temp:     98.7 degrees F oral Pulse rate:   92 / minute Pulse rhythm:   regular Resp:     16 per minute BP sitting:   120 / 84  (left arm) Cuff size:   regular  Vitals Entered By: Lanier Prude, CMA(AAMA) (April 08, 2010 10:02 AM) CC: bilateral ankle/feet swelling X 2 wks Is Patient Diabetic? No   Primary Care Provider:  Georgina Quint Emmy Keng MD  CC:  bilateral ankle/feet swelling X 2 wks.  History of Present Illness: C/o LE edema x 1 wk; no pain in LE. Better  w/elev and compr socks. Some  L ankle pain. No injury.  Current Medications (verified): 1)  Levothroid 50 Mcg Tabs (Levothyroxine Sodium) .... Take 1 Tab By Mouth Daily 2)  Aspir-Low 81 Mg Tbec (Aspirin) .Marland Kitchen.. 1 Once Daily After Meal 3)  Multi-Vitamin  Tabs (Multiple Vitamin) .... Once Daily 4)  Veramyst 27.5 Mcg/spray Susp (Fluticasone Furoate) .Marland Kitchen.. 1 Spr Each Nostr Qd 5)  Flax Seeds  Powd (Flaxseed (Linseed)) .Marland Kitchen.. 1  Tbsp Once Daily 6)  Singulair 10 Mg Tabs (Montelukast Sodium) .Marland Kitchen.. 1 By Mouth Daily 7)  Vitamin D3 1000 Unit  Tabs (Cholecalciferol) .Marland Kitchen.. 1 By Mouth Daily 8)  Welchol 625 Mg Tabs (Colesevelam Hcl) .Marland Kitchen.. 1-2 As Needed Diarrhea 9)  Wellbutrin Xl 150 Mg Xr24h-Tab (Bupropion Hcl) .... 2  Every Morning 10)  Amantadine Hcl 100 Mg Tabs (Amantadine Hcl) .Marland Kitchen.. 1 Once Daily 11)  Lamictal 200 Mg Tabs (Lamotrigine) .... At Bedtime 12)  Klonopin 2 Mg Tabs (Clonazepam) .... At Bedtime 13)  Zaleplon 5 Mg Caps (Zaleplon) .... At Bedtime 14)  Vimovo 500-20 Mg Tbec (Naproxen-Esomeprazole) .Marland Kitchen.. 1 By Mouth Once Daily - Two Times A Day Pc As Needed Pain  Allergies (verified): 1)  ! Keflex 2)  ! Prozac (Fluoxetine Hcl) 3)  Zithromax 4)  Toradol 5)  Erythromycin  Past History:  Past Medical History: Last updated: 03/06/2010 Hypothyroidism Benign  prostatic hypertrophy, elev PSA resolved after Abx 2008 Dr Retta Diones (PSA 5.15 in 8/09) Depression, h/o bipolar Dr Robley Fries at [facility] Hyperlipidemia Elev. glu Allergic rhinitis  Social History: Last updated: 03/06/2010 Occupation:  Married Never Smoked Regular exercise-yes treadmill  Review of Systems  The patient denies chest pain, syncope, and dyspnea on exertion.    Physical Exam  General:  Well-developed,well-nourished,in no acute distress; alert,appropriate and cooperative throughout examination Hoarse Mouth:  WNL Hoarse Lungs:  Normal respiratory effort, chest expands symmetrically. Lungs are clear to auscultation, no crackles or wheezes. Heart:  Normal rate and regular rhythm. S1 and S2 normal without gallop, murmur, click, rub or other extra sounds. Abdomen:  Bowel sounds positive,abdomen soft and non-tender without masses, organomegaly or hernias noted. Msk:  No deformity or scoliosis noted of thoracic or lumbar spine.   Extremities:  no edema B   Impression & Recommendations:  Problem # 1:  ANKLE PAIN (ICD-719.47) R Assessment New  Orders: TLB-BMP (Basic Metabolic Panel-BMET) (80048-METABOL) TLB-Sedimentation Rate (ESR) (85652-ESR) TLB-Uric Acid, Blood (84550-URIC) TLB-TSH (Thyroid Stimulating Hormone) (84443-TSH) TLB-Udip ONLY (81003-UDIP)  Problem # 2:  EDEMA (ICD-782.3) B Assessment: New  His updated medication list for this problem includes:    Furosemide 20 Mg Tabs (Furosemide) .Marland Kitchen... 1 by mouth qam as needed  for swelling as needed  Orders: TLB-BMP (Basic Metabolic Panel-BMET) (80048-METABOL) TLB-Sedimentation Rate (ESR) (85652-ESR) TLB-Uric Acid, Blood (84550-URIC) TLB-TSH (Thyroid Stimulating Hormone) (84443-TSH) TLB-Udip ONLY (81003-UDIP)  Complete Medication List: 1)  Levothroid 50 Mcg Tabs (Levothyroxine sodium) .... Take 1 tab by mouth daily 2)  Aspir-low 81 Mg Tbec (Aspirin) .Marland Kitchen.. 1 once daily after meal 3)  Multi-vitamin Tabs (Multiple  vitamin) .... Once daily 4)  Veramyst 27.5 Mcg/spray Susp (Fluticasone furoate) .Marland Kitchen.. 1 spr each nostr qd 5)  Flax Seeds Powd (Flaxseed (linseed)) .Marland Kitchen.. 1  tbsp once daily 6)  Singulair 10 Mg Tabs (Montelukast sodium) .Marland Kitchen.. 1 by mouth daily 7)  Vitamin D3 1000 Unit Tabs (Cholecalciferol) .Marland Kitchen.. 1 by mouth daily 8)  Welchol 625 Mg Tabs (Colesevelam hcl) .Marland Kitchen.. 1-2 as needed diarrhea 9)  Wellbutrin Xl 150 Mg Xr24h-tab (Bupropion hcl) .... 2  every morning 10)  Amantadine Hcl 100 Mg Tabs (Amantadine hcl) .Marland Kitchen.. 1 once daily 11)  Lamictal 200 Mg Tabs (Lamotrigine) .... At bedtime 12)  Klonopin 2 Mg Tabs (Clonazepam) .... At bedtime 13)  Zaleplon 5 Mg Caps (Zaleplon) .... At bedtime 14)  Furosemide 20 Mg Tabs (Furosemide) .Marland Kitchen.. 1 by mouth qam as needed for swelling as needed 15)  Klor-con M10 10 Meq Cr-tabs (Potassium chloride crys cr) .Marland Kitchen.. 1 by mouth qd  Patient Instructions: 1)  Call if you are not better in a reasonable amount of time or if worse. Keep return office  2)  Compr socks 3)  Elevate legs Prescriptions: KLOR-CON M10 10 MEQ CR-TABS (POTASSIUM CHLORIDE CRYS CR) 1 by mouth qd  #30 x 3   Entered and Authorized by:   Tresa Garter MD   Signed by:   Tresa Garter MD on 04/08/2010   Method used:   Electronically to        [facility]. # [phone]* (retail)       [address]       [place], [place]  [address]       Ph: [phone]       Fax: [phone]   RxID:   [id number] FUROSEMIDE 20 MG TABS (FUROSEMIDE) 1 by mouth qam as needed for swelling as needed  #30 x 3   Entered and Authorized by:   Tresa Garter MD   Signed by:   Tresa Garter MD on 04/08/2010   Method used:   Electronically to        [facility]. # [phone]* (retail)       [address]       [place], [place]  [address]       Ph: [phone]       Fax: [phone]   RxID:   [id number]

## 2010-08-13 NOTE — Letter (Signed)
Summary: [facility]  [facility]   Imported By: Lester [place] 01/22/2010 07:49:02  _____________________________________________________________________  External Attachment:    Type:   Image     Comment:   External Document

## 2010-08-15 NOTE — Progress Notes (Signed)
Summary: Lamictal  Phone Note Call from Patient Call back at Home Phone [phone]   Caller: Hilda Lias Summary of Call: Pt's psych is out of office, mail order will not deliver until next week. Ok for temp refill of lamictal?  Initial call taken by: Lamar Sprinkles, CMA,  July 05, 2010 12:16 PM  Follow-up for Phone Call        sure Follow-up by: Tresa Garter MD,  July 05, 2010 1:25 PM  Additional Follow-up for Phone Call Additional follow up Details #1::        Pt advised via home number per [facility]IP[employer] Additional Follow-up by: Margaret Pyle, CMA,  July 05, 2010 1:28 PM    Prescriptions: LAMICTAL 200 MG TABS (LAMOTRIGINE) at bedtime  #30 x 1   Entered by:   Margaret Pyle, CMA   Authorized by:   Tresa Garter MD   Signed by:   Margaret Pyle, CMA on 07/05/2010   Method used:   Electronically to        [facility]. # [phone]* (retail)       [address]       [place], [place]  [address]       Ph: [phone]       Fax: [phone]   RxID:   [id number]

## 2010-08-15 NOTE — Progress Notes (Signed)
Summary: Rx's  Phone Note From Other Clinic   Summary of Call: Rx's not recieved per wife, refaxed now Initial call taken by: Lamar Sprinkles, CMA,  August 01, 2010 2:28 PM

## 2010-08-15 NOTE — Letter (Signed)
[facility]   Imported By: Sherian Rein 07/12/2010 08:51:22  _____________________________________________________________________  External Attachment:    Type:   Image     Comment:   External Document

## 2010-08-15 NOTE — Assessment & Plan Note (Signed)
Summary: 4 MO ROV /NWS #   Vital Signs:  Patient profile:   55 year old male Height:      73 inches Weight:      173 pounds BMI:     22.91 Temp:     98.6 degrees F oral Pulse rate:   92 / minute Pulse rhythm:   regular BP sitting:   118 / 84  (left arm) Cuff size:   regular  Vitals Entered By: Lanier Prude, Beverly Gust) (July 10, 2010 2:55 PM) CC: 4 mo f/u  Is Patient Diabetic? No   Primary Care Tanashia Ciesla:  Tresa Garter MD  CC:  4 mo f/u .  History of Present Illness: The patient presents for a follow up of edema, voice loss, hyperlipidemia, hypothyroidism and elev PSA   Current Medications (verified): 1)  Levothroid 50 Mcg Tabs (Levothyroxine Sodium) .... Take 1 Tab By Mouth Daily 2)  Aspir-Low 81 Mg Tbec (Aspirin) .Marland Kitchen.. 1 Once Daily After Meal 3)  Multi-Vitamin  Tabs (Multiple Vitamin) .... Once Daily 4)  Veramyst 27.5 Mcg/spray Susp (Fluticasone Furoate) .Marland Kitchen.. 1 Spr Each Nostr Qd 5)  Flax Seeds  Powd (Flaxseed (Linseed)) .Marland Kitchen.. 1  Tbsp Once Daily 6)  Singulair 10 Mg Tabs (Montelukast Sodium) .Marland Kitchen.. 1 By Mouth Daily 7)  Vitamin D3 1000 Unit  Tabs (Cholecalciferol) .Marland Kitchen.. 1 By Mouth Daily 8)  Welchol 625 Mg Tabs (Colesevelam Hcl) .Marland Kitchen.. 1-2 As Needed Diarrhea 9)  Wellbutrin Xl 150 Mg Xr24h-Tab (Bupropion Hcl) .... 2  Every Morning 10)  Amantadine Hcl 100 Mg Tabs (Amantadine Hcl) .Marland Kitchen.. 1 Once Daily 11)  Lamictal 200 Mg Tabs (Lamotrigine) .... At Bedtime 12)  Klonopin 2 Mg Tabs (Clonazepam) .... At Bedtime 13)  Zaleplon 5 Mg Caps (Zaleplon) .... At Bedtime 14)  Furosemide 20 Mg Tabs (Furosemide) .Marland Kitchen.. 1 By Mouth Qam As Needed For Swelling As Needed 15)  Klor-Con M10 10 Meq Cr-Tabs (Potassium Chloride Crys Cr) .Marland Kitchen.. 1 By Mouth Qd 16)  Avodart 0.5 Mg Caps (Dutasteride) .Marland Kitchen.. 1 By Mouth Every Other Day  Allergies (verified): 1)  ! Keflex 2)  ! Prozac (Fluoxetine Hcl) 3)  Zithromax 4)  Toradol 5)  Erythromycin  Past History:  Past Medical History: Reviewed history from  03/06/2010 and no changes required. Hypothyroidism Benign prostatic hypertrophy, elev PSA resolved after Abx 2008 Dr Retta Diones (PSA 5.15 in 8/09) Depression, h/o bipolar Dr Robley Fries at [facility] Hyperlipidemia Elev. glu Allergic rhinitis  Past Surgical History: Prostate bx 2010 Vocal cord surgery 2011 implants  Review of Systems  The patient denies chest pain, syncope, and dyspnea on exertion.    Physical Exam  General:  Well-developed,well-nourished,in no acute distress; alert,appropriate and cooperative throughout examination Hoarseness is better Mouth:  WNL Less hoarse Heart:  Normal rate and regular rhythm. S1 and S2 normal without gallop, murmur, click, rub or other extra sounds. Abdomen:  Bowel sounds positive,abdomen soft and non-tender without masses, organomegaly or hernias noted. Msk:  No deformity or scoliosis noted of thoracic or lumbar spine.   Neurologic:  No cranial nerve deficits noted. Station and gait are normal. Plantar reflexes are down-going bilaterally. DTRs are symmetrical throughout. Sensory, motor and coordinative functions appear intact. Skin:  Intact without suspicious lesions or rashes Psych:  Oriented X3, memory weak for recent, normally interactive, and good eye contact.     Impression & Recommendations:  Problem # 1:  EDEMA (ICD-782.3) Assessment Improved  His updated medication list for this problem includes:    Furosemide 20 Mg Tabs (  Furosemide) .Marland Kitchen... 1 by mouth qam as needed for swelling as needed  Problem # 2:  HYPERLIPIDEMIA (ICD-272.4) Assessment: Improved  His updated medication list for this problem includes:    Welchol 625 Mg Tabs (Colesevelam hcl) .Marland Kitchen... 1-2 as needed diarrhea  Problem # 3:  HOARSENESS (YNW-295.62) Assessment: Improved  S/p surgery  Problem # 4:  HYPOTHYROIDISM (ICD-244.9) Assessment: Unchanged  His updated medication list for this problem includes:    Levothroid 50 Mcg Tabs (Levothyroxine sodium) .Marland Kitchen... Take 1 tab  by mouth daily  Problem # 5:  PSA, INCREASED (ICD-790.93) Assessment: Comment Only F/u w/Urol is pending   Complete Medication List: 1)  Levothroid 50 Mcg Tabs (Levothyroxine sodium) .... Take 1 tab by mouth daily 2)  Aspir-low 81 Mg Tbec (Aspirin) .Marland Kitchen.. 1 once daily after meal 3)  Multi-vitamin Tabs (Multiple vitamin) .... Once daily 4)  Veramyst 27.5 Mcg/spray Susp (Fluticasone furoate) .Marland Kitchen.. 1 spr each nostr qd 5)  Flax Seeds Powd (Flaxseed (linseed)) .Marland Kitchen.. 1  tbsp once daily 6)  Singulair 10 Mg Tabs (Montelukast sodium) .Marland Kitchen.. 1 by mouth daily 7)  Vitamin D3 1000 Unit Tabs (Cholecalciferol) .Marland Kitchen.. 1 by mouth daily 8)  Welchol 625 Mg Tabs (Colesevelam hcl) .Marland Kitchen.. 1-2 as needed diarrhea 9)  Wellbutrin Xl 150 Mg Xr24h-tab (Bupropion hcl) .... 2  every morning 10)  Amantadine Hcl 100 Mg Tabs (Amantadine hcl) .Marland Kitchen.. 1 once daily 11)  Lamictal 200 Mg Tabs (Lamotrigine) .... At bedtime 12)  Klonopin 2 Mg Tabs (Clonazepam) .... At bedtime 13)  Zaleplon 5 Mg Caps (Zaleplon) .... At bedtime 14)  Furosemide 20 Mg Tabs (Furosemide) .Marland Kitchen.. 1 by mouth qam as needed for swelling as needed 15)  Klor-con M10 10 Meq Cr-tabs (Potassium chloride crys cr) .Marland Kitchen.. 1 by mouth qd 16)  Avodart 0.5 Mg Caps (Dutasteride) .Marland Kitchen.. 1 by mouth every other day  Other Orders: Tdap => 51yrs IM (13086) Admin 1st Vaccine (57846)  Patient Instructions: 1)  Please schedule a follow-up appointment in 6 months. 2)  BMP prior to visit, ICD-9:244.80 3)  Lipid Panel prior to visit, ICD-9: 4)  TSH prior to visit, ICD-9:   Orders Added: 1)  Tdap => 33yrs IM [90715] 2)  Admin 1st Vaccine [90471] 3)  Est. Patient Level IV [96295]   Immunizations Administered:  Tetanus Vaccine:    Vaccine Type: Tdap    Site: left deltoid    Mfr: GlaxoSmithKline    Dose: 0.5 ml    Route: IM    Given by: Lanier Prude, CMA(AAMA)    Exp. Date: 05/02/2012    Lot #: [id number]    VIS given: 05/31/08 version given July 10, 2010.   Immunizations Administered:  Tetanus Vaccine:    Vaccine Type: Tdap    Site: left deltoid    Mfr: GlaxoSmithKline    Dose: 0.5 ml    Route: IM    Given by: Lanier Prude, CMA(AAMA)    Exp. Date: 05/02/2012    Lot #: [id number]    VIS given: 05/31/08 version given July 10, 2010.

## 2010-08-15 NOTE — Progress Notes (Signed)
Summary: RFs  Phone Note Refill Request Message from:  Fax from Pharmacy on July 31, 2010 8:49 AM  Pt needs five day supply on all his medications sent into [facility] in [place]. Pt is having to stay a week longer than they expected and only brought enough medications to last him for a week and he is having to stay another week. Can we call these medications into [facility] Phone number [phone] and fax number is [phone]  Initial call taken by: Ami Bullins CMA,  July 31, 2010 8:50 AM  Follow-up for Phone Call        ok Thank you!  Follow-up by: Tresa Garter MD,  July 31, 2010 1:15 PM  Additional Follow-up for Phone Call Additional follow up Details #1::        Pt called back, pharm would prefer a fax from our office. Rx's pending signature.............Marland KitchenLamar Sprinkles, CMA  July 31, 2010 3:44 PM     Additional Follow-up for Phone Call Additional follow up Details #2::    Rx's faxed around 4pm  Follow-up by: Lamar Sprinkles, CMA,  August 01, 2010 11:22 AM  Prescriptions: AVODART 0.5 MG CAPS (DUTASTERIDE) 1 by mouth every other day  #14 x 0   Entered by:   Lamar Sprinkles, CMA   Authorized by:   Tresa Garter MD   Signed by:   Lamar Sprinkles, CMA on 07/31/2010   Method used:   Print then Give to Patient   RxID:   [id number] KLOR-CON M10 10 MEQ CR-TABS (POTASSIUM CHLORIDE CRYS CR) 1 by mouth qd  #14 x 0   Entered by:   Lamar Sprinkles, CMA   Authorized by:   Tresa Garter MD   Signed by:   Lamar Sprinkles, CMA on 07/31/2010   Method used:   Print then Give to Patient   RxID:   [id number] FUROSEMIDE 20 MG TABS (FUROSEMIDE) 1 by mouth qam as needed for swelling as needed  #14 x 0   Entered by:   Lamar Sprinkles, CMA   Authorized by:   Tresa Garter MD   Signed by:   Lamar Sprinkles, CMA on 07/31/2010   Method used:   Print then Give to Patient   RxID:   [id number] ZALEPLON 5 MG CAPS (ZALEPLON) at bedtime  #14 x 0  Entered by:   Lamar Sprinkles, CMA   Authorized by:   Tresa Garter MD   Signed by:   Lamar Sprinkles, CMA on 07/31/2010   Method used:   Print then Give to Patient   RxID:   [id number] TKZSWFUX 2 MG TABS (CLONAZEPAM) at bedtime  #14 x 0   Entered by:   Lamar Sprinkles, CMA   Authorized by:   Tresa Garter MD   Signed by:   Lamar Sprinkles, CMA on 07/31/2010   Method used:   Print then Give to Patient   RxID:   [id number] LAMICTAL 200 MG TABS (LAMOTRIGINE) at bedtime  #14 x 0   Entered by:   Lamar Sprinkles, CMA   Authorized by:   Tresa Garter MD   Signed by:   Lamar Sprinkles, CMA on 07/31/2010   Method used:   Print then Give to Patient   RxID:   [id number] WELLBUTRIN XL 150 MG XR24H-TAB (BUPROPION HCL) 2  every morning  #14 x 0   Entered by:   Lamar Sprinkles, CMA   Authorized by:   Tresa Garter MD   Signed  by:   Lamar Sprinkles, CMA on 07/31/2010   Method used:   Print then Give to Patient   RxID:   [id number] WELCHOL 625 MG TABS (COLESEVELAM HCL) 1-2 as needed diarrhea  #20 x 0   Entered by:   Lamar Sprinkles, CMA   Authorized by:   Tresa Garter MD   Signed by:   Lamar Sprinkles, CMA on 07/31/2010   Method used:   Print then Give to Patient   RxID:   [id number] SINGULAIR 10 MG TABS (MONTELUKAST SODIUM) 1 by mouth daily  #14 x 0   Entered by:   Lamar Sprinkles, CMA   Authorized by:   Tresa Garter MD   Signed by:   Lamar Sprinkles, CMA on 07/31/2010   Method used:   Print then Give to Patient   RxID:   [id number] VERAMYST 27.5 MCG/SPRAY SUSP (FLUTICASONE FUROATE) 1 spr each nostr qd  #1 x 0   Entered by:   Lamar Sprinkles, CMA   Authorized by:   Tresa Garter MD   Signed by:   Lamar Sprinkles, CMA on 07/31/2010   Method used:   Print then Give to Patient   RxID:   [id number] LEVOTHROID 50 MCG TABS (LEVOTHYROXINE SODIUM) Take 1 tab by mouth daily  #14 x 0   Entered by:   Lamar Sprinkles, CMA   Authorized by:    Tresa Garter MD   Signed by:   Lamar Sprinkles, CMA on 07/31/2010   Method used:   Print then Give to Patient   RxID:   [id number]

## 2010-10-25 ENCOUNTER — Telehealth: Payer: Self-pay | Admitting: Internal Medicine

## 2010-10-25 MED ORDER — MONTELUKAST SODIUM 10 MG PO TABS: 10.0000 mg | ORAL_TABLET | Freq: Every day | ORAL | Status: DC

## 2010-10-25 MED ORDER — LEVOTHYROXINE SODIUM 50 MCG PO TABS: 50.0000 ug | ORAL_TABLET | Freq: Every day | ORAL | Status: DC

## 2010-10-25 NOTE — Telephone Encounter (Signed)
Rx request sent in to [facility] per protocol.

## 2010-11-29 NOTE — Assessment & Plan Note (Signed)
[facility]                           PRIMARY CARE OFFICE NOTE   Adam Keller, Adam Keller                        MRN:          [id number]  DATE:08/12/2006                            DOB:          Aug 14, 1955    The patient is a 55 year old male who presents for a well physical  examination. Past medical history, family history, social history as per  August 11, 2005 note. His mother is 25 years old, father died at age of  82, likely with an myocardial infarction.   CURRENT MEDICATIONS:  Reviewed.   ALLERGIES:  KEFLEX, Z-PAK, ERYTHROMYCIN, DEPAKOTE MOSTLY INTOLERANT.   REVIEW OF SYSTEMS:  Negative for chest pain, no shortness of breath, no  syncope, no neurologic complaints, denies urinary symptoms, denies  emotional problems including manias, depression, and such. The rest of  the 18 point review of system is negative.   PHYSICAL EXAMINATION:  He looks well, he is in no acute distress.  Blood pressure 142/82, pulse 87, temperature 99, weight 204 pounds.  HEENT: Moist mucosa.  NECK: Supple, no thyromegaly, or bruits.  LUNGS: Clear to auscultation and percussion. No wheeze, or rales.  HEART: S1 and S2, no murmur, no gallop.  ABDOMEN: Soft, nontender, no organomegaly or mass felt.  LOWER EXTREMITIES: Without edema.  PSYCH: Alert, oriented, and cooperative, denies being depressed. Denies  other psychiatric problems.  RECTAL EXAMINATION: Reveals normal prostate, stool guaiac negative. No  masses, no prostate nodules. Stool is guaiac negative.   Labs on August 05, 2006, CBC normal, CMET normal with glucose 107,  cholesterol 148, LDL 86, cholesterol 2.1, PSA 2.3, urinalysis normal.   EKG with normal sinus rhythm.   ASSESSMENT/PLAN:  1. Normal wellness examination. Age/health related issues. Healthy      lifestyle discussed. Repeat exam in 12 months.  2. Bipolar disorder, obtain Depakote level, doing well on current      therapy.  3. Elevated PSA,  (greater than 2.0), his last PSA was 2.04 we will      recheck in 6 months, urology referral if remains elevated, digital      rectal exam was normal.    Aleksei V. Plotnikov, MD  Electronically Signed   AVP/MedQ  DD: 08/25/2006  DT: 08/25/2006  Job #: [id number]

## 2011-01-02 ENCOUNTER — Other Ambulatory Visit: Payer: Self-pay | Admitting: Internal Medicine

## 2011-01-02 ENCOUNTER — Other Ambulatory Visit (INDEPENDENT_AMBULATORY_CARE_PROVIDER_SITE_OTHER): Payer: Federal, State, Local not specified - PPO

## 2011-01-02 DIAGNOSIS — E038 Other specified hypothyroidism: Secondary | ICD-10-CM

## 2011-01-02 LAB — LIPID PANEL
Cholesterol: 201 mg/dL — ABNORMAL HIGH (ref 0–200)
HDL: 54 mg/dL (ref >39.00)
Triglycerides: 37 mg/dL (ref 0.0–149.0)
VLDL: 7.4 mg/dL (ref 0.0–40.0)

## 2011-01-02 LAB — BASIC METABOLIC PANEL
BUN: 11 mg/dL (ref 6–23)
Chloride: 105 mEq/L (ref 96–112)
GFR: 105.65 mL/min (ref >60.00)
Potassium: 4.2 mEq/L (ref 3.5–5.1)
Sodium: 138 mEq/L (ref 135–145)

## 2011-01-02 LAB — LDL CHOLESTEROL, DIRECT: Direct LDL: 116.5 mg/dL

## 2011-01-02 LAB — TSH: TSH: 1.17 u[IU]/mL (ref 0.35–5.50)

## 2011-01-03 ENCOUNTER — Other Ambulatory Visit: Payer: Self-pay

## 2011-01-03 ENCOUNTER — Other Ambulatory Visit: Payer: Self-pay | Admitting: Internal Medicine

## 2011-01-03 DIAGNOSIS — E038 Other specified hypothyroidism: Secondary | ICD-10-CM

## 2011-01-10 ENCOUNTER — Ambulatory Visit (INDEPENDENT_AMBULATORY_CARE_PROVIDER_SITE_OTHER): Payer: Federal, State, Local not specified - PPO | Admitting: Internal Medicine

## 2011-01-10 ENCOUNTER — Encounter: Payer: Self-pay | Admitting: Internal Medicine

## 2011-01-10 DIAGNOSIS — R972 Elevated prostate specific antigen [PSA]: Secondary | ICD-10-CM

## 2011-01-10 DIAGNOSIS — R498 Other voice and resonance disorders: Secondary | ICD-10-CM

## 2011-01-10 DIAGNOSIS — F329 Major depressive disorder, single episode, unspecified: Secondary | ICD-10-CM

## 2011-01-10 DIAGNOSIS — R609 Edema, unspecified: Secondary | ICD-10-CM

## 2011-01-10 DIAGNOSIS — E039 Hypothyroidism, unspecified: Secondary | ICD-10-CM

## 2011-01-10 NOTE — Progress Notes (Signed)
  Subjective:    Patient ID: Adam Keller, male    DOB: 08-Nov-1955, 55 y.o.   MRN: [id number]  HPI  The patient presents for a follow-up of  chronic hypertension, chronic dyslipidemia, elev PSA, hypothyroidism, controlled with medicines Wife is sick with post-THR Staph   Review of Systems  Constitutional: Positive for unexpected weight change (lost). Negative for appetite change and fatigue.  HENT: Negative for nosebleeds, congestion, sore throat, sneezing, trouble swallowing and neck pain.   Eyes: Negative for itching and visual disturbance.  Respiratory: Negative for cough.   Cardiovascular: Negative for chest pain, palpitations and leg swelling.  Gastrointestinal: Negative for nausea, diarrhea, blood in stool and abdominal distention.  Genitourinary: Negative for frequency and hematuria.  Musculoskeletal: Negative for back pain, joint swelling and gait problem.  Skin: Negative for rash.  Neurological: Negative for dizziness, tremors, speech difficulty and weakness.  Psychiatric/Behavioral: Negative for sleep disturbance, dysphoric mood and agitation. The patient is not nervous/anxious.   Not suicidal     Objective:   Physical Exam  Constitutional: He is oriented to person, place, and time. He appears well-developed.  HENT:  Mouth/Throat: Oropharynx is clear and moist.  Eyes: Conjunctivae are normal. Pupils are equal, round, and reactive to light.  Neck: Normal range of motion. No JVD present. No thyromegaly present.  Cardiovascular: Normal rate, regular rhythm, normal heart sounds and intact distal pulses.  Exam reveals no gallop and no friction rub.   No murmur heard. Pulmonary/Chest: Effort normal and breath sounds normal. No respiratory distress. He has no wheezes. He has no rales. He exhibits no tenderness.  Abdominal: Soft. Bowel sounds are normal. He exhibits no distension and no mass. There is no tenderness. There is no rebound and no guarding.  Musculoskeletal: Normal  range of motion. He exhibits no edema and no tenderness.  Lymphadenopathy:    He has no cervical adenopathy.  Neurological: He is alert and oriented to person, place, and time. He has normal reflexes. No cranial nerve deficit. He exhibits normal muscle tone. Coordination normal.  Skin: Skin is warm and dry. No rash noted.  Psychiatric: Judgment and thought content normal.  Hoarse, weak voice      Lab Results  Component Value Date   WBC 4.7 02/26/2010   HGB 16.7 02/26/2010   HCT 47.5 02/26/2010   PLT 180.0 02/26/2010   CHOL 201* 01/02/2011   TRIG 37.0 01/02/2011   HDL 54.00 01/02/2011   LDLDIRECT 116.5 01/02/2011   ALT 12 02/26/2010   AST 18 02/26/2010   NA 138 01/02/2011   K 4.2 01/02/2011   CL 105 01/02/2011   CREATININE 0.8 01/02/2011   BUN 11 01/02/2011   CO2 27 01/02/2011   TSH 1.17 01/02/2011   PSA 6.50* 02/26/2010   HGBA1C 5.3 11/03/2008     Assessment & Plan:

## 2011-01-12 NOTE — Assessment & Plan Note (Signed)
F/u w/Urology 

## 2011-01-12 NOTE — Assessment & Plan Note (Signed)
On Rx 

## 2011-01-12 NOTE — Assessment & Plan Note (Signed)
Some better 

## 2011-01-12 NOTE — Assessment & Plan Note (Signed)
Wife is s/p THR w/Staph complications this spring

## 2011-06-09 ENCOUNTER — Ambulatory Visit (INDEPENDENT_AMBULATORY_CARE_PROVIDER_SITE_OTHER)
Admission: RE | Admit: 2011-06-09 | Discharge: 2011-06-09 | Disposition: A | Discharge status: Home / Self Care | Payer: Federal, State, Local not specified - PPO | Source: Ambulatory Visit | Attending: Internal Medicine | Admitting: Internal Medicine

## 2011-06-09 ENCOUNTER — Encounter: Payer: Self-pay | Admitting: Internal Medicine

## 2011-06-09 ENCOUNTER — Telehealth: Payer: Self-pay | Admitting: *Deleted

## 2011-06-09 ENCOUNTER — Ambulatory Visit (INDEPENDENT_AMBULATORY_CARE_PROVIDER_SITE_OTHER): Payer: Federal, State, Local not specified - PPO | Admitting: Internal Medicine

## 2011-06-09 ENCOUNTER — Encounter: Payer: Self-pay | Admitting: Neurology

## 2011-06-09 VITALS — BP 122/78 | HR 94 | Temp 98.2°F | Ht 72.0 in | Wt 161.1 lb

## 2011-06-09 DIAGNOSIS — H02539 Eyelid retraction unspecified eye, unspecified lid: Secondary | ICD-10-CM | POA: Insufficient documentation

## 2011-06-09 DIAGNOSIS — R519 Headache, unspecified: Secondary | ICD-10-CM

## 2011-06-09 DIAGNOSIS — R51 Headache: Secondary | ICD-10-CM

## 2011-06-09 DIAGNOSIS — G43909 Migraine, unspecified, not intractable, without status migrainosus: Secondary | ICD-10-CM | POA: Insufficient documentation

## 2011-06-09 MED ORDER — HYDROCODONE-ACETAMINOPHEN 5-325 MG PO TABS: 2.0000 | ORAL_TABLET | Freq: Four times a day (QID) | ORAL | Status: DC | PRN

## 2011-06-09 NOTE — Patient Instructions (Addendum)
Take all new medications as prescribed - the pain medication as needed Continue all other medications as before You will be contacted regarding the referral for: CT sinus  - now if possible - see PCC's now to schedule You will be contacted regarding the referral for: Head MRI and Neurology (at a later time)

## 2011-06-09 NOTE — Assessment & Plan Note (Signed)
Also cannot r/o migraine, but will need MRI head as above,  to f/u any worsening symptoms or concerns

## 2011-06-09 NOTE — Assessment & Plan Note (Signed)
I suspect neuralgic pain such as trigeminal neuralgia but does not explain the left upper lid lag and left hemicranial pain as well;  Also cant r/o sinusitis, though no fever and sinus without signficent tender/swelling - for Ct sinus film today, but also will need head MRI, and neuro consult;  For pain control for now he prefers tylenol, but ok for vicodin prn more severe flares

## 2011-06-09 NOTE — Progress Notes (Signed)
Subjective:    Patient ID: Adam Keller, male    DOB: Nov 11, 1955, 55 y.o.   MRN: [id number]  HPI  Here to f/u, c/o pain to right neck aprox nov 13, left side only, starte with stiffness constant that night, woke him up; seemed to continue with progression of pain in the next few days to area defined by area preauricular/left face, around the eye and left hemicranium (not left ear or post head or post neck) but assoc also with drooping left upper eyelid with some slight noticeable vision impairment (not vision blurred;  No balance issue or falls except some off-balance to get up at night (which is very infreqeunt).  No recent trauma, no fever, sinus symptoms.  Pt denies new neurological symptoms such as new headache, or facial or extremity weakness or numbness except for the above.  Under some stress recent due to wife illness with some general fatigue for him related it seems.  No prior hx of this issue, but has hx of neurocardiogenic syncope. Pain overall at 2/10 but has several episodes of 10/10. Nothing seems to make worse or better.  Has ongoing chronic hoarseness no change s/p vocal cord/ent surgury Past Medical History  Diagnosis Date  . Hypothyroidism   . BPH (benign prostatic hyperplasia)     elev PSA resolved after abx 2008 Dr Retta Diones (PSA 5.15 in 8/09)  . Depression     h/o bipolar Dr Robley Fries at [facility]  . Hyperlipidemia   . Elevated glucose   . Allergic rhinitis    Past Surgical History  Procedure Date  . Prostate biopsy   . Vocal cord surgery 2011    implants    reports that he has quit smoking. He does not have any smokeless tobacco history on file. He reports that he does not drink alcohol or use illicit drugs. family history includes Heart disease in his father and mother and Hypertension in his other. Allergies  Allergen Reactions  . Azithromycin     REACTION: diarrhea  . Cephalexin     REACTION: rash  . Erythromycin     REACTION: nausea  . Fluoxetine Hcl   . Ketorolac  Tromethamine     REACTION: vomiting   Current Outpatient Prescriptions on File Prior to Visit  Medication Sig Dispense Refill  . amantadine (SYMMETREL) 100 MG capsule Take 100 mg by mouth daily.        Marland Kitchen aspirin 81 MG EC tablet Take 81 mg by mouth daily.        Marland Kitchen buPROPion (WELLBUTRIN XL) 150 MG 24 hr tablet Take 300 mg by mouth every morning.        . Cholecalciferol 1000 UNITS tablet Take 1,000 Units by mouth daily.        . clonazePAM (KLONOPIN) 2 MG tablet Take 2 mg by mouth at bedtime as needed.        . colesevelam (WELCHOL) 625 MG tablet Take 625-1,250 mg by mouth as needed. For diarrhea       . dutasteride (AVODART) 0.5 MG capsule Take 0.5 mg by mouth every other day.        . fish oil-omega-3 fatty acids 1000 MG capsule Take 1 g by mouth daily.        . fluticasone (VERAMYST) 27.5 MCG/SPRAY nasal spray 1 spray by Nasal route daily.        Marland Kitchen lamoTRIgine (LAMICTAL) 200 MG tablet Take 200 mg by mouth at bedtime.        Marland Kitchen  levothyroxine (SYNTHROID, LEVOTHROID) 50 MCG tablet Take 1 tablet (50 mcg total) by mouth daily.  90 tablet  3  . Lutein 20 MG TABS Take 1 tablet by mouth daily.        . montelukast (SINGULAIR) 10 MG tablet Take 1 tablet (10 mg total) by mouth daily.  90 tablet  3  . Multiple Vitamins-Minerals (MULTIVITAMIN,TX-MINERALS) tablet Take 1 tablet by mouth daily.        . potassium chloride (KLOR-CON) 10 MEQ CR tablet Take 10 mEq by mouth daily.        . Selenium 200 MCG CAPS Take 1 capsule by mouth daily.        . zaleplon (SONATA) 5 MG capsule Take 5 mg by mouth at bedtime.         Review of Systems Review of Systems  Constitutional: Negative for diaphoresis and unexpected weight change.  HENT: Negative for drooling and tinnitus.   Eyes: Negative for photophobia and visual disturbance.  Respiratory: Negative for choking and stridor.   Gastrointestinal: Negative for vomiting and blood in stool.  Genitourinary: Negative for hematuria and decreased urine volume.    Musculoskeletal: Negative for gait problem.      Objective:   Physical Exam BP 122/78  Pulse 94  Temp(Src) 98.2 F (36.8 C) (Oral)  Ht 6' (1.829 m)  Wt 161 lb 2 oz (73.086 kg)  BMI 21.85 kg/m2  SpO2 97% Physical Exam  VS noted Constitutional: Pt appears well-developed and well-nourished.  HENT: Head: Normocephalic.  Right Ear: External ear normal.  Left Ear: External ear normal.  Eyes: Conjunctivae and EOM are normal. Pupils are equal, round, and reactive to light.  Neck: Normal range of motion. Neck supple.  Cardiovascular: Normal rate and regular rhythm.   Pulmonary/Chest: Effort normal and breath sounds normal.  Bilat tm's mild erythema.  Sinus nontender.  Pharynx mild erythema, no facial swelling Neurological: Pt is alert. No cranial nerve deficit. but does have left upper lid lag mild, motor/dtr/gait intact Skin: Skin is warm. No erythema.  Psychiatric: Pt behavior is normal. Thought content normal.     Assessment & Plan:

## 2011-06-09 NOTE — Assessment & Plan Note (Signed)
Unclear etiology, for w/u as per left facial pain, neuro eval as well,  to f/u any worsening symptoms or concerns

## 2011-06-09 NOTE — Telephone Encounter (Signed)
Pt's wife calling req OV today. Pt c/o HA, eye pain and eye lid drooping x 4 days. I advised AVP has no openings but offered OV with Dr. Jonny Ruiz. Appt scheduled for today at 2:45pm.

## 2011-06-12 ENCOUNTER — Ambulatory Visit (HOSPITAL_COMMUNITY)
Admission: RE | Admit: 2011-06-12 | Discharge: 2011-06-12 | Disposition: A | Discharge status: Home / Self Care | Payer: Federal, State, Local not specified - PPO | Source: Ambulatory Visit | Attending: Internal Medicine | Admitting: Internal Medicine

## 2011-06-12 ENCOUNTER — Other Ambulatory Visit: Payer: Self-pay | Admitting: Internal Medicine

## 2011-06-12 DIAGNOSIS — H02409 Unspecified ptosis of unspecified eyelid: Secondary | ICD-10-CM | POA: Insufficient documentation

## 2011-06-12 DIAGNOSIS — I7771 Dissection of carotid artery: Secondary | ICD-10-CM | POA: Insufficient documentation

## 2011-06-12 DIAGNOSIS — R110 Nausea: Secondary | ICD-10-CM | POA: Insufficient documentation

## 2011-06-12 DIAGNOSIS — G43909 Migraine, unspecified, not intractable, without status migrainosus: Secondary | ICD-10-CM

## 2011-06-12 DIAGNOSIS — R519 Headache, unspecified: Secondary | ICD-10-CM

## 2011-06-12 DIAGNOSIS — H02539 Eyelid retraction unspecified eye, unspecified lid: Secondary | ICD-10-CM

## 2011-06-12 DIAGNOSIS — R51 Headache: Secondary | ICD-10-CM | POA: Insufficient documentation

## 2011-06-13 ENCOUNTER — Encounter: Payer: Self-pay | Admitting: Internal Medicine

## 2011-06-13 ENCOUNTER — Other Ambulatory Visit (INDEPENDENT_AMBULATORY_CARE_PROVIDER_SITE_OTHER): Payer: Federal, State, Local not specified - PPO

## 2011-06-13 ENCOUNTER — Telehealth: Payer: Self-pay | Admitting: Internal Medicine

## 2011-06-13 DIAGNOSIS — I7771 Dissection of carotid artery: Secondary | ICD-10-CM

## 2011-06-13 HISTORY — DX: Dissection of carotid artery: I77.71

## 2011-06-13 LAB — BASIC METABOLIC PANEL
BUN: 16 mg/dL (ref 6–23)
CO2: 28 mEq/L (ref 19–32)
Calcium: 9.4 mg/dL (ref 8.4–10.5)
Creatinine, Ser: 0.8 mg/dL (ref 0.4–1.5)

## 2011-06-13 NOTE — Telephone Encounter (Signed)
recevied call per Dr Nita Sells, radiology - MRI with left carotid dissection noted  I spoke to pt;  We will get bmet today in lab, and pt then to come to office as he is able to be scheduled for neck CTA, as well as referral asap to Vascular surgury

## 2011-06-16 ENCOUNTER — Ambulatory Visit (INDEPENDENT_AMBULATORY_CARE_PROVIDER_SITE_OTHER)
Admission: RE | Admit: 2011-06-16 | Discharge: 2011-06-16 | Disposition: A | Discharge status: Home / Self Care | Payer: Federal, State, Local not specified - PPO | Source: Ambulatory Visit | Attending: Internal Medicine | Admitting: Internal Medicine

## 2011-06-16 DIAGNOSIS — I7771 Dissection of carotid artery: Secondary | ICD-10-CM

## 2011-06-16 MED ORDER — IOHEXOL 350 MG/ML SOLN
100.0000 mL | Freq: Once | INTRAVENOUS | Status: AC | PRN
  Administered 2011-06-16: 100 mL via INTRAVENOUS

## 2011-06-18 ENCOUNTER — Telehealth: Payer: Self-pay

## 2011-06-18 MED ORDER — TRAMADOL HCL 50 MG PO TABS: 50.0000 mg | ORAL_TABLET | Freq: Four times a day (QID) | ORAL | Status: AC | PRN

## 2011-06-18 NOTE — Telephone Encounter (Signed)
No heavy lifting or bending for now or contact sports/new exercise program

## 2011-06-18 NOTE — Telephone Encounter (Signed)
Ok to change to tramadol prn  I would still keep appt with dr Modesto Charon

## 2011-06-18 NOTE — Telephone Encounter (Signed)
Should the patient's activity level be restricted?

## 2011-06-18 NOTE — Telephone Encounter (Signed)
The patient called to inform the tylenol with codeine is not working, he is still having severe headaches. Also does he need to keep his appt. With Dr. Modesto Charon?? Should he change his activity level at all? Call back number is [phone]

## 2011-06-19 ENCOUNTER — Telehealth: Payer: Self-pay

## 2011-06-19 NOTE — Telephone Encounter (Signed)
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

## 2011-06-19 NOTE — Telephone Encounter (Signed)
Patient informed. 

## 2011-06-19 NOTE — Telephone Encounter (Signed)
Called and spoke to the patient informed of results.

## 2011-06-19 NOTE — Telephone Encounter (Signed)
Patients wife called requesting CT results.

## 2011-06-20 ENCOUNTER — Encounter: Payer: Self-pay | Admitting: Vascular Surgery

## 2011-07-09 ENCOUNTER — Ambulatory Visit (INDEPENDENT_AMBULATORY_CARE_PROVIDER_SITE_OTHER): Payer: Federal, State, Local not specified - PPO | Admitting: Internal Medicine

## 2011-07-09 ENCOUNTER — Encounter: Payer: Self-pay | Admitting: Internal Medicine

## 2011-07-09 VITALS — BP 128/82 | HR 84 | Temp 98.0°F | Resp 16 | Wt 162.0 lb

## 2011-07-09 DIAGNOSIS — R51 Headache: Secondary | ICD-10-CM

## 2011-07-09 DIAGNOSIS — R519 Headache, unspecified: Secondary | ICD-10-CM

## 2011-07-09 DIAGNOSIS — I7771 Dissection of carotid artery: Secondary | ICD-10-CM

## 2011-07-09 MED ORDER — HYDROCODONE-ACETAMINOPHEN 5-325 MG PO TABS: 2.0000 | ORAL_TABLET | Freq: Four times a day (QID) | ORAL | Status: DC | PRN

## 2011-07-09 MED ORDER — FLUTICASONE FUROATE 27.5 MCG/SPRAY NA SUSP: 1.0000 | Freq: Every day | NASAL | Status: DC

## 2011-07-09 MED ORDER — LEVOTHYROXINE SODIUM 50 MCG PO TABS: 50.0000 ug | ORAL_TABLET | Freq: Every day | ORAL | Status: DC

## 2011-07-09 MED ORDER — ASPIRIN 81 MG PO TBEC: 81.0000 mg | DELAYED_RELEASE_TABLET | Freq: Two times a day (BID) | ORAL | Status: DC

## 2011-07-09 MED ORDER — TRAMADOL HCL 50 MG PO TABS: 50.0000 mg | ORAL_TABLET | Freq: Four times a day (QID) | ORAL | Status: DC | PRN

## 2011-07-09 NOTE — Progress Notes (Signed)
  Subjective:    Patient ID: Adam Keller, male    DOB: 1956/03/21, 55 y.o.   MRN: [id number]  HPI    Review of Systems     Objective:   Physical Exam        Assessment & Plan:

## 2011-07-09 NOTE — Assessment & Plan Note (Signed)
Left by MRI 11/12 IMPRESSION:  1. 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  2. No associated acute infarct. No other acute intracranial  Abnormality.  On ASA Neurol consult is pending

## 2011-07-09 NOTE — Progress Notes (Signed)
Patient ID: JAILIN MOOMAW, male   DOB: [birth year], 55 y.o.   MRN: [id number]  Subjective:    Patient ID: LUISMIGUEL LAMERE, male    DOB: [birth year], 55 y.o.   MRN: [id number]  HPI  The patient presents for a follow-up of  chronic hypertension, chronic dyslipidemia, elev PSA, hypothyroidism, controlled with medicines Wife is sick with post-THR Staph - better   Review of Systems  Constitutional: Negative for appetite change, fatigue and unexpected weight change (lost).  HENT: Negative for nosebleeds, congestion, sore throat, sneezing, trouble swallowing and neck pain.   Eyes: Negative for itching and visual disturbance.  Respiratory: Negative for cough.   Cardiovascular: Negative for chest pain, palpitations and leg swelling.  Gastrointestinal: Negative for nausea, diarrhea, blood in stool and abdominal distention.  Genitourinary: Negative for frequency and hematuria.  Musculoskeletal: Negative for back pain, joint swelling and gait problem.  Skin: Negative for rash.  Neurological: Negative for dizziness, tremors, speech difficulty and weakness.  Psychiatric/Behavioral: Negative for sleep disturbance, dysphoric mood and agitation. The patient is not nervous/anxious.   Not suicidal     Objective:   Physical Exam  Constitutional: He is oriented to person, place, and time. He appears well-developed.  HENT:  Mouth/Throat: Oropharynx is clear and moist.  Eyes: Conjunctivae are normal. Pupils are equal, round, and reactive to light.  Neck: Normal range of motion. No JVD present. No thyromegaly present.  Cardiovascular: Normal rate, regular rhythm, normal heart sounds and intact distal pulses.  Exam reveals no gallop and no friction rub.   No murmur heard. Pulmonary/Chest: Effort normal and breath sounds normal. No respiratory distress. He has no wheezes. He has no rales. He exhibits no tenderness.  Abdominal: Soft. Bowel sounds are normal. He exhibits no distension and no mass. There is no  tenderness. There is no rebound and no guarding.  Musculoskeletal: Normal range of motion. He exhibits no edema and no tenderness.  Lymphadenopathy:    He has no cervical adenopathy.  Neurological: He is alert and oriented to person, place, and time. He has normal reflexes. No cranial nerve deficit. He exhibits normal muscle tone. Coordination normal.  Skin: Skin is warm and dry. No rash noted.  Psychiatric: Judgment and thought content normal.  Hoarse, less weak voice      Lab Results  Component Value Date   WBC 4.7 02/26/2010   HGB 16.7 02/26/2010   HCT 47.5 02/26/2010   PLT 180.0 02/26/2010   CHOL 201* 01/02/2011   TRIG 37.0 01/02/2011   HDL 54.00 01/02/2011   LDLDIRECT 116.5 01/02/2011   ALT 12 02/26/2010   AST 18 02/26/2010   NA 140 06/13/2011   K 4.1 06/13/2011   CL 106 06/13/2011   CREATININE 0.8 06/13/2011   BUN 16 06/13/2011   CO2 28 06/13/2011   TSH 1.17 01/02/2011   PSA 6.50* 02/26/2010   HGBA1C 5.3 11/03/2008     Assessment & Plan:

## 2011-07-16 ENCOUNTER — Encounter: Payer: Self-pay | Admitting: Neurology

## 2011-07-16 ENCOUNTER — Ambulatory Visit (INDEPENDENT_AMBULATORY_CARE_PROVIDER_SITE_OTHER): Payer: Federal, State, Local not specified - PPO | Admitting: Neurology

## 2011-07-16 DIAGNOSIS — R51 Headache: Secondary | ICD-10-CM

## 2011-07-16 DIAGNOSIS — R519 Headache, unspecified: Secondary | ICD-10-CM

## 2011-07-16 MED ORDER — GABAPENTIN 300 MG PO CAPS: ORAL_CAPSULE | ORAL | Status: DC

## 2011-07-16 NOTE — Patient Instructions (Addendum)
gabapentin 300 capsules  take 1 at night for 5 days then increase to  1 twice a day for 5 days then increase to 1 three times a day from then on.  Call if no pain relief.  Your CTA of the head and neck is scheduled at [facility] radiology department on Friday, January 4th at 1200 noon. Nothing to eat or drink after 8:00 am that day. Arrive 15 minutes prior to your scheduled appointment. [phone].  Follow up appointment with Dr. Modesto Charon is scheduled for February 13th at 11:30 am. [phone]  Your prescription has been sent to your pharmacy.

## 2011-07-16 NOTE — Progress Notes (Signed)
Dear Dr. Posey Keller,  Thank you for having me see Adam Keller in consultation today at [facility] for his problem with left sided head pain and carotid dissection.  As you may recall, he is a 56 y.o. year old male with a history of neurocardiogenic syncope, bilateral vocal cord dysfunction and atrophy who presents with pressure like left sided head pain that is worse behind the left eye, but radiates to the left vertex.  It can get very severe at times, although is not accompanied by photo or phonophobia.  He can become nauseous with the pain.  There are no significant autonomic features.  He had noticed dull pressure before November 13th, but on that date the pain became severe. He also noted a left eye droop on that day.   He was found to have a carotid dissection extending to the proximal petrous portion of the left carotid artery on MRI brain.  He was not placed on anticoagulation.   The pain has been persistent since then although becomes severe almost daily, with stress and frustration making it worse.  Notably he has no history of trauma.  He has had migraine headaches in his 57s and these are dissimilar.  He has not noted any focal dysfunction otherwise.  Interestingly he also has a history of neurocardiogenic syncope in the 1990s with changes in position confirmed by tilt table testing.  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  Past Medical History  Diagnosis Date  . Hypothyroidism   . BPH (benign prostatic hyperplasia)     elev PSA resolved after abx 2008 Dr Adam Keller (PSA 5.15 in 8/09)  . Depression     h/o bipolar Dr Adam Keller at [facility]  . Hyperlipidemia   . Elevated glucose   . Allergic rhinitis   . 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 Prostate biopsy   . Vocal cord surgery 2011    implants    History   Social History  . Marital Status: Married    Spouse Name: N/A    Number of  Children: N/A  . Years of Education: N/A   Social History Main Topics  . Smoking status: Former Smoker    Types: Cigars  . Smokeless tobacco: Never Used  . Alcohol Use: No  . Drug Use: No  . Sexually Active: Not Currently   Other Topics Concern  . None   Social History Narrative   Regular Exercise - yes treadmill    Family History  Problem Relation Age of Onset  . Hypertension Other   . Heart disease Mother     CHF  . Heart disease Father     CHF    Current Outpatient Prescriptions on File Prior to Visit  Medication Sig Dispense Refill  . amantadine (SYMMETREL) 100 MG capsule Take 100 mg by mouth daily.        Marland Kitchen aspirin 81 MG EC tablet Take 1 tablet (81 mg total) by mouth 2 (two) times daily.  100 tablet  5  . buPROPion (WELLBUTRIN XL) 150 MG 24 hr tablet Take 300 mg by mouth every morning.        . Cholecalciferol 1000 UNITS tablet Take 1,000 Units by mouth daily.        . clonazePAM (KLONOPIN) 2 MG tablet Take 2 mg by mouth at bedtime as needed.        . colesevelam (WELCHOL) 625 MG  tablet Take 625-1,250 mg by mouth as needed. For diarrhea       . dutasteride (AVODART) 0.5 MG capsule Take 0.5 mg by mouth every other day.        . fish oil-omega-3 fatty acids 1000 MG capsule Take 1 g by mouth daily.        . fluticasone (VERAMYST) 27.5 MCG/SPRAY nasal spray Place 1 spray into the nose daily.  10 g  5  . HYDROcodone-acetaminophen (NORCO) 5-325 MG per tablet Take 2 tablets by mouth every 6 (six) hours as needed for pain.  60 tablet  1  . lamoTRIgine (LAMICTAL) 200 MG tablet Take 200 mg by mouth at bedtime.        Marland Kitchen levothyroxine (SYNTHROID, LEVOTHROID) 50 MCG tablet Take 1 tablet (50 mcg total) by mouth daily.  90 tablet  3  . Lutein 20 MG TABS Take 1 tablet by mouth daily.        . montelukast (SINGULAIR) 10 MG tablet Take 1 tablet (10 mg total) by mouth daily.  90 tablet  3  . Multiple Vitamins-Minerals (MULTIVITAMIN,TX-MINERALS) tablet Take 1 tablet by mouth daily.          Marland Kitchen omeprazole (PRILOSEC) 40 MG capsule Take 40 mg by mouth daily.        . Selenium 200 MCG CAPS Take 1 capsule by mouth daily.        . traMADol (ULTRAM) 50 MG tablet Take 1 tablet (50 mg total) by mouth every 6 (six) hours as needed. Maximum dose= 8 tablets per day  100 tablet  2  . zaleplon (SONATA) 5 MG capsule Take 5 mg by mouth at bedtime.        . potassium chloride (KLOR-CON) 10 MEQ CR tablet Take 10 mEq by mouth daily.          Allergies  Allergen Reactions  . Azithromycin     REACTION: diarrhea  . Cephalexin     REACTION: rash  . Erythromycin     REACTION: nausea  . Fluoxetine Hcl   . Ketorolac Tromethamine     REACTION: vomiting      ROS:  13 systems were reviewed and are notable for headache is worse when lying.  All other review of systems are unremarkable.   Examination:  Filed Vitals:   07/16/11 0806  BP: 120/82  Pulse: 84  Height: 6' (1.829 m)  Weight: 163 lb (73.936 kg)     In general, well appearig  Cardiovascular: The patient has a regular rate and rhythm and no carotid bruits.  Fundoscopy:  Disks are flat. Vessel caliber within normal limits.  Mental status:   The patient is oriented to person, place and time. Recent and remote memory are intact. Attention span and concentration are normal. Language including repetition, naming, following commands are intact. Fund of knowledge of current and historical events, as well as vocabulary are normal.  Cranial Nerves: Pupils reveal a right sided anisocoria.   Visual fields full to confrontation. Extraocular movements are intact without nystagmus. Facial sensation decreased over left V1 V2. Muscles of facial expression are symmetric. He does have a left ptosis. Hearing decreased on the left, Weber test does not lateralize, however, A>C on the left suggesting sensorineural hearing loss. Tongue protrusion, uvula, palate midline.  Shoulder shrug intact  Motor:  The patient has normal bulk and tone, no  pronator drift.  There are no adventitious movements.  5/5 bilaterally.  Reflexes:  Symmetric, quiety.  Toes down  Coordination:  Normal finger to nose.  No dysdiadokinesia.  Sensation is intact to temperature and vibration.  Gait and Station are normal.  Tandem gait is intact.  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   Impression/Recs: 1.  Carotid dissection - spontaneous.  The typical treatment is anti-coagulation as the dissection is extradural.  However, I would like to reimage his carotid given his persistent head pain to ensure it has not extended intradurally.  If it has not he should be started on Coumadin.  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 2.  Head pain - slightly atypical given his sensory changes and its persistence, although I think this is most likely from his dissection.  This usually improves over time.  However, I would suggest trying gabapentin to increase to 300 tid to see if this provides any relief.  If it does not he will call me. 3.  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   We will see the patient back in 6 weeks.  Thank you for having [place] see Adam Keller in consultation.  Feel free to contact me with any questions.  Lupita Raider Modesto Charon, MD [facility], [place] [address] [place], [place] [address] Phone: [phone] Fax: [phone].

## 2011-07-18 ENCOUNTER — Ambulatory Visit (HOSPITAL_COMMUNITY)
Admission: RE | Admit: 2011-07-18 | Discharge: 2011-07-18 | Disposition: A | Discharge status: Home / Self Care | Payer: Federal, State, Local not specified - PPO | Source: Ambulatory Visit | Attending: Neurology | Admitting: Neurology

## 2011-07-18 DIAGNOSIS — R519 Headache, unspecified: Secondary | ICD-10-CM

## 2011-07-18 DIAGNOSIS — R42 Dizziness and giddiness: Secondary | ICD-10-CM | POA: Insufficient documentation

## 2011-07-18 DIAGNOSIS — I720 Aneurysm of carotid artery: Secondary | ICD-10-CM | POA: Insufficient documentation

## 2011-07-18 DIAGNOSIS — R51 Headache: Secondary | ICD-10-CM | POA: Insufficient documentation

## 2011-07-18 MED ORDER — IOHEXOL 350 MG/ML SOLN
80.0000 mL | Freq: Once | INTRAVENOUS | Status: AC | PRN
  Administered 2011-07-18: 80 mL via INTRAVENOUS

## 2011-07-28 ENCOUNTER — Encounter: Payer: Self-pay | Admitting: Vascular Surgery

## 2011-07-29 ENCOUNTER — Ambulatory Visit (INDEPENDENT_AMBULATORY_CARE_PROVIDER_SITE_OTHER): Payer: Federal, State, Local not specified - PPO | Admitting: Vascular Surgery

## 2011-07-29 ENCOUNTER — Encounter: Payer: Self-pay | Admitting: Vascular Surgery

## 2011-07-29 VITALS — BP 129/68 | HR 79 | Resp 20 | Ht 72.0 in | Wt 166.0 lb

## 2011-07-29 DIAGNOSIS — I7771 Dissection of carotid artery: Secondary | ICD-10-CM

## 2011-07-29 NOTE — Progress Notes (Signed)
Subjective:     Patient ID: Adam Keller, male   DOB: [birth year], 56 y.o.   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 He awoke at night with severe left-sided headache in the frontal and temporal region as well as pain behind his left eye. He had no lateralizing weakness, or facial asymmetry or aphasia but did have some blurring of vision of the left eye which is now resolved. 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 He has continued to have headaches although not as severe as initially and they seem to be worse when he is in the supine position. He has no history of hypertension and his blood pressure has been under good control since this event. He does not use tobacco. He has not been exercising or doing anything stressful other than work since his event. He was referred for further recommendations regarding this  Past Medical History  Diagnosis Date  . Hypothyroidism   . BPH (benign prostatic hyperplasia)     elev PSA resolved after abx 2008 Dr Retta Diones (PSA 5.15 in 8/09)  . Depression     h/o bipolar Dr Robley Fries at [facility]  . Hyperlipidemia   . Elevated glucose   . Allergic rhinitis   . 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 Horner's syndrome   . Headache     History  Substance Use Topics  . Smoking status: Former Smoker    Types: Cigarettes, Cigars    Quit date: 07/28/2008  . Smokeless tobacco: Never Used  . Alcohol Use: No    Family History  Problem Relation Age of Onset  . Hypertension Other   . Heart disease Mother     CHF  . Cancer Mother   . Diabetes Mother   . Hyperlipidemia Mother   . Heart disease Father     CHF    Allergies  Allergen Reactions  . Azithromycin     REACTION: diarrhea  . Cephalexin     REACTION: rash  . Erythromycin     REACTION: nausea  . Fluoxetine Hcl   . Ketorolac  Tromethamine     REACTION: vomiting    Current outpatient prescriptions:amantadine (SYMMETREL) 100 MG capsule, Take 100 mg by mouth daily.  , Disp: , Rfl: ;  aspirin 81 MG EC tablet, Take 1 tablet (81 mg total) by mouth 2 (two) times daily., Disp: 100 tablet, Rfl: 5;  buPROPion (WELLBUTRIN XL) 150 MG 24 hr tablet, Take 300 mg by mouth every morning.  , Disp: , Rfl: ;  Cholecalciferol 1000 UNITS tablet, Take 1,000 Units by mouth daily.  , Disp: , Rfl:  clonazePAM (KLONOPIN) 2 MG tablet, Take 2 mg by mouth at bedtime as needed.  , Disp: , Rfl: ;  colesevelam (WELCHOL) 625 MG tablet, Take 625-1,250 mg by mouth as needed. For diarrhea , Disp: , Rfl: ;  dutasteride (AVODART) 0.5 MG capsule, Take 0.5 mg by mouth every other day.  , Disp: , Rfl: ;  fish oil-omega-3 fatty acids 1000 MG capsule, Take 1 g by mouth daily.  , Disp: , Rfl:  fluticasone (VERAMYST) 27.5 MCG/SPRAY nasal spray, Place 1 spray into the nose daily., Disp: 10 g, Rfl: 5;  gabapentin (NEURONTIN) 300 MG capsule, increase to 1 cap three times a day as directed., Disp: 90 capsule, Rfl: 2;  HYDROcodone-acetaminophen (NORCO)  5-325 MG per tablet, Take 2 tablets by mouth every 6 (six) hours as needed for pain., Disp: 60 tablet, Rfl: 1 lamoTRIgine (LAMICTAL) 200 MG tablet, Take 200 mg by mouth at bedtime.  , Disp: , Rfl: ;  levothyroxine (SYNTHROID, LEVOTHROID) 50 MCG tablet, Take 1 tablet (50 mcg total) by mouth daily., Disp: 90 tablet, Rfl: 3;  Lutein 20 MG TABS, Take 1 tablet by mouth daily.  , Disp: , Rfl: ;  montelukast (SINGULAIR) 10 MG tablet, Take 1 tablet (10 mg total) by mouth daily., Disp: 90 tablet, Rfl: 3 Multiple Vitamins-Minerals (MULTIVITAMIN,TX-MINERALS) tablet, Take 1 tablet by mouth daily.  , Disp: , Rfl: ;  omeprazole (PRILOSEC) 40 MG capsule, Take 40 mg by mouth daily.  , Disp: , Rfl: ;  potassium chloride (KLOR-CON) 10 MEQ CR tablet, Take 10 mEq by mouth daily.  , Disp: , Rfl: ;  Selenium 200 MCG CAPS, Take 1 capsule by mouth daily.   , Disp: , Rfl:  traMADol (ULTRAM) 50 MG tablet, Take 1 tablet (50 mg total) by mouth every 6 (six) hours as needed. Maximum dose= 8 tablets per day, Disp: 100 tablet, Rfl: 2;  zaleplon (SONATA) 5 MG capsule, Take 5 mg by mouth at bedtime.  , Disp: , Rfl:   BP 129/68  Pulse 79  Resp 20  Ht 6' (1.829 m)  Wt 166 lb (75.297 kg)  BMI 22.51 kg/m2  Body mass index is 22.51 kg/(m^2).         Review of Systems he denies chest pain, dyspnea on exertion, PND, orthopnea, hemoptysis, claudication, wheezing, and all symptoms other than those in the present illness the complete review of systems     Objective:   Physical Exam pressure 129/68 heart rate 79 respirations 20 HEENT normal for age General well-developed well-nourished male no apparent distress alert and oriented x3 Lungs no rhonchi or wheezing Cardiovascular regular with no murmurs carotid pulses 3+ no audible bruits Abdomen soft nontender with no palpable masses Musculoskeletal 3 major deformities Lower extremity 3+ femoral and dorsalis pedis pulses palpable with no edema Skin free of rashes Neurologic no lateralizing abnormalities are noted. Pupils are symmetrical today. No lid lag is noted. Normal exam    Assessment:     I have reviewed the CT angiogram and agree that he does have a focal dissection of his left internal carotid artery at the C1-C2 level with dilatation of the artery to 3 x 6 mm over a short segment. The artery proximal and distal to this appears normal. There is significant redundancy of the proximal left internal carotid artery.    Plan:     Spontaneous dissection left internal carotid artery Would recommend aspirin therapy and avoidance of any stressful situations and good control of blood pressure Would repeat CT angiogram in 6 months to see if remodeling  has occurred. He will likely not require any treatment for this . Would not recommend a stent in this situation because artery is thin and damaged.  Hopefully it will remain patent and remodel over time which is the usual course. If he should develop left brain TIA is in the antrum then I would repeat CT angio at that time. I will see him on when necessary basis

## 2011-08-06 ENCOUNTER — Encounter: Payer: Federal, State, Local not specified - PPO | Admitting: Vascular Surgery

## 2011-08-27 ENCOUNTER — Ambulatory Visit (INDEPENDENT_AMBULATORY_CARE_PROVIDER_SITE_OTHER): Payer: Federal, State, Local not specified - PPO | Admitting: Neurology

## 2011-08-27 ENCOUNTER — Encounter: Payer: Self-pay | Admitting: Neurology

## 2011-08-27 DIAGNOSIS — R51 Headache: Secondary | ICD-10-CM

## 2011-08-27 NOTE — Patient Instructions (Addendum)
Your CTA head and neck is scheduled at [facility] on Monday, May 13th at 9:00 am. Please check in at the first floor radiology department 15 minutes prior to your scheduled appointment time.  Nothing to eat or drink 4 hours before the test.   [phone].   Your next appointment with Dr. Modesto Charon is scheduled on 05/20 at 2:30 pm.   [phone].

## 2011-08-27 NOTE — Progress Notes (Signed)
Dear Dr. Posey Rea,  I saw  Adam Keller back in [place] [facility] for his problem with left carotid dissection.  As you may recall, he is a 56 y.o. year old male with a history of neurogenic syncope, bilateral vocal cord dysfunction and atrophy who developed severe left head pain on May 27, 2011.  He as found to have a carotid dissection extending to the proximal petrous portion of the left carotid artery on MRI brain and was placed on aspirin.  I felt that his head pain was consistent with the dissection and started him on gabapentin.  He also saw a vascular surgeon who felt that no intervention was necessary at that time.  CTA revealed a small pseudoaneurysm as well.  At first I felt that he might benefit from being on coumadin but after some discussion with a vascular neurologist I decided to keep him on aspirin.  He has done well since then.  His headaches have improved.  He is currently taking 600mg  qhs of gabapentin.  He has had no other spells.  Medical history, social history, and family history were reviewed and have not changed since the last clinic visit.  Current Outpatient Prescriptions on File Prior to Visit  Medication Sig Dispense Refill  . amantadine (SYMMETREL) 100 MG capsule Take 100 mg by mouth daily.        Marland Kitchen aspirin 81 MG EC tablet Take 1 tablet (81 mg total) by mouth 2 (two) times daily.  100 tablet  5  . buPROPion (WELLBUTRIN XL) 150 MG 24 hr tablet Take 300 mg by mouth every morning.        . Cholecalciferol 1000 UNITS tablet Take 1,000 Units by mouth daily.        . clonazePAM (KLONOPIN) 2 MG tablet Take 2 mg by mouth at bedtime as needed.        . colesevelam (WELCHOL) 625 MG tablet Take 625-1,250 mg by mouth as needed. For diarrhea       . dutasteride (AVODART) 0.5 MG capsule Take 0.5 mg by mouth every other day.        . fish oil-omega-3 fatty acids 1000 MG capsule Take 1 g by mouth daily.        . fluticasone (VERAMYST) 27.5 MCG/SPRAY nasal spray  Place 1 spray into the nose daily.  10 g  5  . gabapentin (NEURONTIN) 300 MG capsule increase to 1 cap three times a day as directed.  90 capsule  2  . HYDROcodone-acetaminophen (NORCO) 5-325 MG per tablet Take 2 tablets by mouth every 6 (six) hours as needed for pain.  60 tablet  1  . lamoTRIgine (LAMICTAL) 200 MG tablet Take 200 mg by mouth at bedtime.        Marland Kitchen levothyroxine (SYNTHROID, LEVOTHROID) 50 MCG tablet Take 1 tablet (50 mcg total) by mouth daily.  90 tablet  3  . Lutein 20 MG TABS Take 1 tablet by mouth daily.        . montelukast (SINGULAIR) 10 MG tablet Take 1 tablet (10 mg total) by mouth daily.  90 tablet  3  . Multiple Vitamins-Minerals (MULTIVITAMIN,TX-MINERALS) tablet Take 1 tablet by mouth daily.        Marland Kitchen omeprazole (PRILOSEC) 40 MG capsule Take 40 mg by mouth daily.        . potassium chloride (KLOR-CON) 10 MEQ CR tablet Take 10 mEq by mouth daily.        . Selenium 200 MCG CAPS Take  1 capsule by mouth daily.        . traMADol (ULTRAM) 50 MG tablet Take 1 tablet (50 mg total) by mouth every 6 (six) hours as needed. Maximum dose= 8 tablets per day  100 tablet  2  . zaleplon (SONATA) 5 MG capsule Take 5 mg by mouth at bedtime.          Allergies  Allergen Reactions  . Azithromycin     REACTION: diarrhea  . Cephalexin     REACTION: rash  . Erythromycin     REACTION: nausea  . Fluoxetine Hcl   . Ketorolac Tromethamine     REACTION: vomiting    ROS:  13 systems were reviewed and are notable for hearing loss, ringing in ears, nose bleeds, nasal congestion and drainage, high cholesterol, thyroid disease, broken bones, arm and leg pain, history of fainting spells, memory problems, disorientation.  All other review of systems are unremarkable.  Exam: . Filed Vitals:   08/27/11 1114  BP: 104/70  Pulse: 72  Height: 6' (1.829 m)  Weight: 167 lb (75.751 kg)    In general, well appearing man.  Mental status:   The patient is oriented to person, place and time. Recent  and remote memory are intact. Attention span and concentration are normal. Language including repetition, naming, following commands are intact. Fund of knowledge of current and historical events, as well as vocabulary are normal.  Cranial Nerves: Mild left sided miosis. Visual fields full to confrontation. Extraocular movements are intact without nystagmus. Facial sensation and muscles of mastication are intact. Muscles of facial expression are symmetric. Mild left ptosis Hearing intact to bilateral finger rub. Tongue protrusion, uvula, palate midline.  Shoulder shrug intact  Motor:  Normal bulk and tone, no drift and 5/5 muscle strength bilaterally.  Reflexes:  2+ thoughout, toes down.  Coordination:  Normal finger to nose  Gait:  Normal gait and station.  Impression/Recommendations:  Left carotid dissection.  Continue aspirin.  Will get CTA head and neck at 6 months and see him back then (3 months from now.)  Lupita Raider. Modesto Charon, MD [facility], [place]

## 2011-09-25 ENCOUNTER — Other Ambulatory Visit: Payer: Self-pay | Admitting: *Deleted

## 2011-09-25 MED ORDER — LEVOTHYROXINE SODIUM 50 MCG PO TABS: 50.0000 ug | ORAL_TABLET | Freq: Every day | ORAL | Status: DC

## 2011-10-17 ENCOUNTER — Ambulatory Visit (INDEPENDENT_AMBULATORY_CARE_PROVIDER_SITE_OTHER): Payer: Federal, State, Local not specified - PPO | Admitting: Internal Medicine

## 2011-10-17 ENCOUNTER — Encounter: Payer: Self-pay | Admitting: Internal Medicine

## 2011-10-17 VITALS — BP 132/90 | HR 84 | Temp 98.6°F | Resp 16 | Wt 175.0 lb

## 2011-10-17 DIAGNOSIS — R609 Edema, unspecified: Secondary | ICD-10-CM

## 2011-10-17 DIAGNOSIS — F329 Major depressive disorder, single episode, unspecified: Secondary | ICD-10-CM

## 2011-10-17 DIAGNOSIS — I7771 Dissection of carotid artery: Secondary | ICD-10-CM

## 2011-10-17 DIAGNOSIS — E039 Hypothyroidism, unspecified: Secondary | ICD-10-CM

## 2011-10-17 DIAGNOSIS — N400 Enlarged prostate without lower urinary tract symptoms: Secondary | ICD-10-CM

## 2011-10-17 DIAGNOSIS — J309 Allergic rhinitis, unspecified: Secondary | ICD-10-CM

## 2011-10-17 DIAGNOSIS — F3289 Other specified depressive episodes: Secondary | ICD-10-CM

## 2011-10-17 DIAGNOSIS — R197 Diarrhea, unspecified: Secondary | ICD-10-CM

## 2011-10-17 MED ORDER — MONTELUKAST SODIUM 10 MG PO TABS: 10.0000 mg | ORAL_TABLET | Freq: Every day | ORAL | Status: DC

## 2011-10-17 NOTE — Assessment & Plan Note (Signed)
Continue with current prescription therapy as reflected on the Med list.  

## 2011-10-17 NOTE — Assessment & Plan Note (Signed)
Continue with current prescription therapy as reflected on the Med list. prn 

## 2011-10-17 NOTE — Progress Notes (Signed)
Patient ID: Adam Keller, male   DOB: 09-02-1955, 56 y.o.   MRN: [id number] Patient ID: Adam Keller, male   DOB: 01/26/1956, 56 y.o.   MRN: [id number]  Subjective:    Patient ID: Adam Keller, male    DOB: [birth year], 56 y.o.   MRN: [id number]  HPI  The patient presents for a follow-up of  chronic hypertension, chronic dyslipidemia, elev PSA, hypothyroidism, controlled with medicines Wife is sick with post-THR Staph - better/stable. He is planning to retire in 6 mo   Review of Systems  Constitutional: Negative for appetite change, fatigue and unexpected weight change (lost).  HENT: Negative for nosebleeds, congestion, sore throat, sneezing, trouble swallowing and neck pain.   Eyes: Negative for itching and visual disturbance.  Respiratory: Negative for cough.   Cardiovascular: Negative for chest pain, palpitations and leg swelling.  Gastrointestinal: Negative for nausea, diarrhea, blood in stool and abdominal distention.  Genitourinary: Negative for frequency and hematuria.  Musculoskeletal: Negative for back pain, joint swelling and gait problem.  Skin: Negative for rash.  Neurological: Negative for dizziness, tremors, speech difficulty and weakness.  Psychiatric/Behavioral: Negative for sleep disturbance, dysphoric mood and agitation. The patient is not nervous/anxious.   Not suicidal     Objective:   Physical Exam  Constitutional: He is oriented to person, place, and time. He appears well-developed.  HENT:  Mouth/Throat: Oropharynx is clear and moist.  Eyes: Conjunctivae are normal. Pupils are equal, round, and reactive to light.  Neck: Normal range of motion. No JVD present. No thyromegaly present.  Cardiovascular: Normal rate, regular rhythm, normal heart sounds and intact distal pulses.  Exam reveals no gallop and no friction rub.   No murmur heard. Pulmonary/Chest: Effort normal and breath sounds normal. No respiratory distress. He has no wheezes. He has no rales. He  exhibits no tenderness.  Abdominal: Soft. Bowel sounds are normal. He exhibits no distension and no mass. There is no tenderness. There is no rebound and no guarding.  Musculoskeletal: Normal range of motion. He exhibits no edema and no tenderness.  Lymphadenopathy:    He has no cervical adenopathy.  Neurological: He is alert and oriented to person, place, and time. He has normal reflexes. No cranial nerve deficit. He exhibits normal muscle tone. Coordination normal.  Skin: Skin is warm and dry. No rash noted.  Psychiatric: Judgment and thought content normal.  Hoarse, less weak voice      Lab Results  Component Value Date   WBC 4.7 02/26/2010   HGB 16.7 02/26/2010   HCT 47.5 02/26/2010   PLT 180.0 02/26/2010   CHOL 201* 01/02/2011   TRIG 37.0 01/02/2011   HDL 54.00 01/02/2011   LDLDIRECT 116.5 01/02/2011   ALT 12 02/26/2010   AST 18 02/26/2010   NA 140 06/13/2011   K 4.1 06/13/2011   CL 106 06/13/2011   CREATININE 0.8 06/13/2011   BUN 16 06/13/2011   CO2 28 06/13/2011   TSH 1.17 01/02/2011   PSA 6.50* 02/26/2010   HGBA1C 5.3 11/03/2008     Assessment & Plan:

## 2011-11-24 ENCOUNTER — Other Ambulatory Visit (HOSPITAL_COMMUNITY): Payer: Federal, State, Local not specified - PPO

## 2011-11-24 ENCOUNTER — Ambulatory Visit (HOSPITAL_COMMUNITY)
Admission: RE | Admit: 2011-11-24 | Discharge: 2011-11-24 | Disposition: A | Discharge status: Home / Self Care | Payer: Federal, State, Local not specified - PPO | Source: Ambulatory Visit | Attending: Neurology | Admitting: Neurology

## 2011-11-24 DIAGNOSIS — I671 Cerebral aneurysm, nonruptured: Secondary | ICD-10-CM | POA: Insufficient documentation

## 2011-11-24 DIAGNOSIS — R51 Headache: Secondary | ICD-10-CM | POA: Insufficient documentation

## 2011-11-24 MED ORDER — IOHEXOL 350 MG/ML SOLN
50.0000 mL | Freq: Once | INTRAVENOUS | Status: AC | PRN
  Administered 2011-11-24: 50 mL via INTRAVENOUS

## 2011-12-01 ENCOUNTER — Ambulatory Visit (INDEPENDENT_AMBULATORY_CARE_PROVIDER_SITE_OTHER): Payer: Federal, State, Local not specified - PPO | Admitting: Neurology

## 2011-12-01 ENCOUNTER — Encounter: Payer: Self-pay | Admitting: Neurology

## 2011-12-01 VITALS — BP 128/76 | HR 84 | Wt 177.0 lb

## 2011-12-01 DIAGNOSIS — IMO0002 Reserved for concepts with insufficient information to code with codable children: Secondary | ICD-10-CM

## 2011-12-01 DIAGNOSIS — M5410 Radiculopathy, site unspecified: Secondary | ICD-10-CM

## 2011-12-01 MED ORDER — AMITRIPTYLINE HCL 25 MG PO TABS: ORAL_TABLET | ORAL | Status: DC

## 2011-12-01 NOTE — Progress Notes (Signed)
Dear Dr. Posey Rea,  I saw  Adam Keller back in [place] [facility] for his problem with left carotid dissection, headaches and right leg pain.  As you may recall, he is a 56 y.o. 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  He did not suffer a stroke.  He was treated with aspirin.  A repeat CTA just done 7 days has shown improvement of his dissection.  He did have refractory left sided headaches after his dissection.  These responded to gabapentin 900mg  qhs, but he is now having worsening headaches after decreasing to 300mg  qhs.  Some of these are migrainous in quality, with throbbing pain, but no photo sensitivity.  They occur almost every day and he wakes up with them.  He also complains of right thigh pain that is burning in quality, worse with lying down.  Does not go below the right knee.  Medical history, social history, and family history were reviewed and have not changed since the last clinic visit.  Current Outpatient Prescriptions on File Prior to Visit  Medication Sig Dispense Refill  . amantadine (SYMMETREL) 100 MG capsule Take 100 mg by mouth daily.        Marland Kitchen aspirin 81 MG EC tablet Take 1 tablet (81 mg total) by mouth 2 (two) times daily.  100 tablet  5  . buPROPion (WELLBUTRIN XL) 150 MG 24 hr tablet Take 300 mg by mouth every morning.        . Cholecalciferol 1000 UNITS tablet Take 1,000 Units by mouth daily.        . clonazePAM (KLONOPIN) 2 MG tablet Take 2 mg by mouth at bedtime as needed.        . colesevelam (WELCHOL) 625 MG tablet Take 625-1,250 mg by mouth as needed. For diarrhea       . dutasteride (AVODART) 0.5 MG capsule Take 0.5 mg by mouth every other day.        . fish oil-omega-3 fatty acids 1000 MG capsule Take 1 g by mouth daily.        . fluticasone (VERAMYST) 27.5 MCG/SPRAY nasal spray Place 1 spray into the nose daily.  10 g  5  . gabapentin (NEURONTIN) 300 MG capsule increase to 1  cap three times a day as directed.  90 capsule  2  . lamoTRIgine (LAMICTAL) 200 MG tablet Take 200 mg by mouth at bedtime.        Marland Kitchen levothyroxine (SYNTHROID, LEVOTHROID) 50 MCG tablet Take 1 tablet (50 mcg total) by mouth daily.  90 tablet  3  . Lutein 20 MG TABS Take 1 tablet by mouth daily.        . montelukast (SINGULAIR) 10 MG tablet Take 1 tablet (10 mg total) by mouth daily.  90 tablet  3  . Multiple Vitamins-Minerals (MULTIVITAMIN,TX-MINERALS) tablet Take 1 tablet by mouth daily.        Marland Kitchen omeprazole (PRILOSEC) 40 MG capsule Take 40 mg by mouth daily.        . Selenium 200 MCG CAPS Take 1 capsule by mouth daily.        . zaleplon (SONATA) 5 MG capsule Take 5 mg by mouth at bedtime.        Marland Kitchen amitriptyline (ELAVIL) 25 MG tablet start with 1/2 tab before bedtime for 1 week, then increase to 1 tab at night from then on.  30 tablet  3  .  HYDROcodone-acetaminophen (NORCO) 5-325 MG per tablet Take 2 tablets by mouth every 6 (six) hours as needed for pain.  60 tablet  1  . potassium chloride (KLOR-CON) 10 MEQ CR tablet Take 10 mEq by mouth daily.          Allergies  Allergen Reactions  . Azithromycin     REACTION: diarrhea  . Cephalexin     REACTION: rash  . Erythromycin     REACTION: nausea  . Fluoxetine Hcl   . Ketorolac Tromethamine     REACTION: vomiting    ROS:  13 systems were reviewed and are notable for right thigh pain as above..  All other review of systems are unremarkable.  Exam: . Filed Vitals:   12/01/11 1423  BP: 128/76  Pulse: 84  Weight: 177 lb (80.287 kg)    In general, well appearing older man.  Cranial Nerves: Pupils mild rightt> left anisocoria   Visual fields full to confrontation. Extraocular movements are intact without nystagmus. Facial sensation and muscles of mastication are intact. Muscles of facial expression are symmetric but left ptosis. Hearing intact to bilateral finger rub. Tongue protrusion, uvula, palate midline.  Shoulder shrug  intact  Motor:  Normal bulk and tone, no drift and 5/5 muscle strength bilaterally.  Reflexes:  Quiet throughout.  Coordination:  Normal finger to nose  Sensation:  Decreased sensation to temperature over the left lateral aspect of his thigh.  Gait:  Normal gait and station.  Romberg negative.  Impression/Recommendations:  1.  Carotid dissection - improving, continue on aspirin. 2.  Headaches - likely post dissection/hx of migraine, start Elavil 12.5->25mg .  He will continue gabapentin for now, but because of sedation we will not increase it.  We will stop it when we get control with Elavil. 3.  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  We will see if Elavil helps this.  We will also get an MRI L-spine to rule out a lumbar radiculopathy.  We will see the patient back in 3  months.  Lupita Raider Modesto Charon, MD [facility],

## 2011-12-01 NOTE — Patient Instructions (Signed)
Your MRI is scheduled for Friday, May 24th at 5:00pm.   Please arrive to [facility] by 4:45pm.  [phone].

## 2011-12-05 ENCOUNTER — Ambulatory Visit (HOSPITAL_COMMUNITY)
Admission: RE | Admit: 2011-12-05 | Discharge: 2011-12-05 | Disposition: A | Discharge status: Home / Self Care | Payer: Federal, State, Local not specified - PPO | Source: Ambulatory Visit | Attending: Neurology | Admitting: Neurology

## 2011-12-05 DIAGNOSIS — M5410 Radiculopathy, site unspecified: Secondary | ICD-10-CM

## 2011-12-05 DIAGNOSIS — IMO0002 Reserved for concepts with insufficient information to code with codable children: Secondary | ICD-10-CM | POA: Insufficient documentation

## 2011-12-05 DIAGNOSIS — M51379 Other intervertebral disc degeneration, lumbosacral region without mention of lumbar back pain or lower extremity pain: Secondary | ICD-10-CM | POA: Insufficient documentation

## 2011-12-05 DIAGNOSIS — M47817 Spondylosis without myelopathy or radiculopathy, lumbosacral region: Secondary | ICD-10-CM | POA: Insufficient documentation

## 2011-12-05 DIAGNOSIS — M5137 Other intervertebral disc degeneration, lumbosacral region: Secondary | ICD-10-CM | POA: Insufficient documentation

## 2011-12-11 ENCOUNTER — Telehealth: Payer: Self-pay | Admitting: Neurology

## 2011-12-11 ENCOUNTER — Other Ambulatory Visit: Payer: Self-pay | Admitting: Neurology

## 2011-12-11 DIAGNOSIS — M79606 Pain in leg, unspecified: Secondary | ICD-10-CM

## 2011-12-11 NOTE — Telephone Encounter (Signed)
Message copied by Benay Spice on Thu Dec 11, 2011  9:10 AM ------      Message from: Denton Meek H      Created: Wed Dec 10, 2011  3:33 PM       Let Mr. Mcmillon know that he does have a small disk herniation on the right that may be causing his leg pain.  Epidural steroid injection may help him.  Would he like [place] to set that up.  It should be a right sided ESI at L4.

## 2011-12-11 NOTE — Telephone Encounter (Signed)
Called and let the patient know ESI sch at [facility] on Monday, June 3rd at 0830; to arrive at 0815. Nothing to eat 4 hours prior to the appointment (may have liquids) and he will need a driver. No other questions at this time.

## 2011-12-11 NOTE — Telephone Encounter (Signed)
Called and spoke with the patient. Information given as per Dr. Modesto Charon re: ESI for disc herniation at L4. The patient will go forward with this and I will set this up and call him back with the appointment date and time. The patient was ok with this plan.

## 2011-12-15 ENCOUNTER — Ambulatory Visit
Admission: RE | Admit: 2011-12-15 | Discharge: 2011-12-15 | Disposition: A | Discharge status: Home / Self Care | Payer: Federal, State, Local not specified - PPO | Source: Ambulatory Visit | Attending: Neurology | Admitting: Neurology

## 2011-12-15 VITALS — BP 130/80 | HR 81

## 2011-12-15 DIAGNOSIS — M79606 Pain in leg, unspecified: Secondary | ICD-10-CM

## 2011-12-15 MED ORDER — METHYLPREDNISOLONE ACETATE 40 MG/ML INJ SUSP (RADIOLOG
120.0000 mg | Freq: Once | INTRAMUSCULAR | Status: AC
  Administered 2011-12-15: 120 mg via EPIDURAL

## 2011-12-15 MED ORDER — IOHEXOL 180 MG/ML  SOLN
1.0000 mL | Freq: Once | INTRAMUSCULAR | Status: AC | PRN
  Administered 2011-12-15: 1 mL via EPIDURAL

## 2011-12-15 NOTE — Discharge Instructions (Signed)

## 2012-02-20 ENCOUNTER — Encounter: Payer: Self-pay | Admitting: Internal Medicine

## 2012-02-20 ENCOUNTER — Ambulatory Visit (INDEPENDENT_AMBULATORY_CARE_PROVIDER_SITE_OTHER): Payer: Federal, State, Local not specified - PPO | Admitting: Internal Medicine

## 2012-02-20 ENCOUNTER — Other Ambulatory Visit (INDEPENDENT_AMBULATORY_CARE_PROVIDER_SITE_OTHER): Payer: Federal, State, Local not specified - PPO

## 2012-02-20 VITALS — BP 120/80 | HR 80 | Temp 98.6°F | Resp 16 | Wt 169.0 lb

## 2012-02-20 DIAGNOSIS — R197 Diarrhea, unspecified: Secondary | ICD-10-CM

## 2012-02-20 DIAGNOSIS — R609 Edema, unspecified: Secondary | ICD-10-CM

## 2012-02-20 DIAGNOSIS — F329 Major depressive disorder, single episode, unspecified: Secondary | ICD-10-CM

## 2012-02-20 DIAGNOSIS — E039 Hypothyroidism, unspecified: Secondary | ICD-10-CM

## 2012-02-20 DIAGNOSIS — J309 Allergic rhinitis, unspecified: Secondary | ICD-10-CM

## 2012-02-20 DIAGNOSIS — F3289 Other specified depressive episodes: Secondary | ICD-10-CM

## 2012-02-20 DIAGNOSIS — I7771 Dissection of carotid artery: Secondary | ICD-10-CM

## 2012-02-20 DIAGNOSIS — N400 Enlarged prostate without lower urinary tract symptoms: Secondary | ICD-10-CM

## 2012-02-20 LAB — BASIC METABOLIC PANEL
BUN: 14 mg/dL (ref 6–23)
CO2: 25 mEq/L (ref 19–32)
Calcium: 9.4 mg/dL (ref 8.4–10.5)
Chloride: 105 mEq/L (ref 96–112)
Creatinine, Ser: 0.8 mg/dL (ref 0.4–1.5)
Glucose, Bld: 107 mg/dL — ABNORMAL HIGH (ref 70–99)

## 2012-02-20 LAB — TSH: TSH: 1.48 u[IU]/mL (ref 0.35–5.50)

## 2012-02-20 MED ORDER — LEVOTHYROXINE SODIUM 50 MCG PO TABS: 50.0000 ug | ORAL_TABLET | Freq: Every day | ORAL | Status: DC

## 2012-02-20 MED ORDER — MONTELUKAST SODIUM 10 MG PO TABS: 10.0000 mg | ORAL_TABLET | Freq: Every day | ORAL | Status: DC

## 2012-02-20 NOTE — Progress Notes (Signed)
  Subjective:    Patient ID: Adam Keller, male    DOB: [birth year], 56 y.o.   MRN: [id number]  HPI  The patient presents for a follow-up of  chronic hypertension, chronic dyslipidemia, elev PSA, hypothyroidism, controlled with medicines Wife is sick with post-THR Staph - better/stable -surg Aug 26. He is planning to retire in Oct 2013  Wt Readings from Last 3 Encounters:  02/20/12 169 lb (76.658 kg)  12/01/11 177 lb (80.287 kg)  10/17/11 175 lb (79.379 kg)   BP Readings from Last 3 Encounters:  02/20/12 120/80  12/15/11 130/80  12/01/11 128/76       Review of Systems  Constitutional: Negative for appetite change, fatigue and unexpected weight change (lost).  HENT: Negative for nosebleeds, congestion, sore throat, sneezing, trouble swallowing and neck pain.   Eyes: Negative for itching and visual disturbance.  Respiratory: Negative for cough.   Cardiovascular: Negative for chest pain, palpitations and leg swelling.  Gastrointestinal: Negative for nausea, diarrhea, blood in stool and abdominal distention.  Genitourinary: Negative for frequency and hematuria.  Musculoskeletal: Negative for back pain, joint swelling and gait problem.  Skin: Negative for rash.  Neurological: Negative for dizziness, tremors, speech difficulty and weakness.  Psychiatric/Behavioral: Negative for disturbed wake/sleep cycle, dysphoric mood and agitation. The patient is not nervous/anxious.   Not suicidal     Objective:   Physical Exam  Constitutional: He is oriented to person, place, and time. He appears well-developed.  HENT:  Mouth/Throat: Oropharynx is clear and moist.  Eyes: Conjunctivae are normal. Pupils are equal, round, and reactive to light.  Neck: Normal range of motion. No JVD present. No thyromegaly present.  Cardiovascular: Normal rate, regular rhythm, normal heart sounds and intact distal pulses.  Exam reveals no gallop and no friction rub.   No murmur heard. Pulmonary/Chest: Effort  normal and breath sounds normal. No respiratory distress. He has no wheezes. He has no rales. He exhibits no tenderness.  Abdominal: Soft. Bowel sounds are normal. He exhibits no distension and no mass. There is no tenderness. There is no rebound and no guarding.  Musculoskeletal: Normal range of motion. He exhibits no edema and no tenderness.  Lymphadenopathy:    He has no cervical adenopathy.  Neurological: He is alert and oriented to person, place, and time. He has normal reflexes. No cranial nerve deficit. He exhibits normal muscle tone. Coordination normal.  Skin: Skin is warm and dry. No rash noted.  Psychiatric: Judgment and thought content normal.  Hoarse, less weak voice      Lab Results  Component Value Date   WBC 4.7 02/26/2010   HGB 16.7 02/26/2010   HCT 47.5 02/26/2010   PLT 180.0 02/26/2010   CHOL 201* 01/02/2011   TRIG 37.0 01/02/2011   HDL 54.00 01/02/2011   LDLDIRECT 116.5 01/02/2011   ALT 12 02/26/2010   AST 18 02/26/2010   NA 140 06/13/2011   K 4.1 06/13/2011   CL 106 06/13/2011   CREATININE 0.8 06/13/2011   BUN 16 06/13/2011   CO2 28 06/13/2011   TSH 1.17 01/02/2011   PSA 6.50* 02/26/2010   HGBA1C 5.3 11/03/2008     Assessment & Plan:

## 2012-02-21 NOTE — Assessment & Plan Note (Signed)
On ASA 

## 2012-02-21 NOTE — Assessment & Plan Note (Signed)
Resolved

## 2012-02-21 NOTE — Assessment & Plan Note (Signed)
Continue with current prescription therapy as reflected on the Med list.  

## 2012-03-02 ENCOUNTER — Encounter: Payer: Self-pay | Admitting: Neurology

## 2012-03-02 ENCOUNTER — Ambulatory Visit (INDEPENDENT_AMBULATORY_CARE_PROVIDER_SITE_OTHER): Payer: Federal, State, Local not specified - PPO | Admitting: Neurology

## 2012-03-02 VITALS — BP 122/80 | HR 88 | Ht 72.0 in | Wt 171.0 lb

## 2012-03-02 DIAGNOSIS — I7771 Dissection of carotid artery: Secondary | ICD-10-CM

## 2012-03-02 NOTE — Patient Instructions (Addendum)
Your CTA head and neck is scheduled for Wednesday, Sept 4 at 9:00am.   Please arrive to [facility], first floor admitting by 8:45am.   [phone].  Nothing to eat or drink four hours prior to scans.

## 2012-03-02 NOTE — Progress Notes (Signed)
Dear Dr. Posey Rea,   I saw Adam Keller back in [facility] for his problem with left carotid dissection, headaches and right leg pain. As you may recall, he is a 56 y.o. 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 He did not suffer a stroke. He was treated with aspirin. A repeat CTA done in February did not show a significant change in the dissection - importantly there was no compromise of the lumen.  He did have refractory left sided headaches after his dissection. These responded to gabapentin 900mg  qhs, but now he has stopped the gabapentin as his headaches have remitted except for occasional mild headaches in the a.m.    At his last visit he was complaining of right thing burning stabbing pain.  While I thought this was due to entrapment of the lateral femoral cutaneous nerve, I did get an MRI of his L-spine as he described worsening with lying down.  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  I sent him for ESI, but he is unsure whether it helped or the Elavil 25mg  hs I started him on.  In any case he has not had any further leg pain, although does get thigh numbness.  He interestingly describes seeing black and white lines when his eyes are closed.  He is due to see an eye doctor about this.    Medical history, social history, and family history were reviewed and have not changed since the last clinic visit.  Current Outpatient Prescriptions on File Prior to Visit  Medication Sig Dispense Refill  . amantadine (SYMMETREL) 100 MG capsule Take 100 mg by mouth daily.        Marland Kitchen amitriptyline (ELAVIL) 25 MG tablet start with 1/2 tab before bedtime for 1 week, then increase to 1 tab at night from then on.  30 tablet  3  . aspirin 81 MG EC tablet Take 1 tablet (81 mg total) by mouth 2 (two) times daily.  100 tablet  5  . buPROPion (WELLBUTRIN XL) 150 MG 24 hr tablet Take  300 mg by mouth every morning.        . Cholecalciferol 1000 UNITS tablet Take 1,000 Units by mouth daily.        . clonazePAM (KLONOPIN) 2 MG tablet Take 2 mg by mouth at bedtime as needed.        . colesevelam (WELCHOL) 625 MG tablet Take 625-1,250 mg by mouth as needed. For diarrhea       . dutasteride (AVODART) 0.5 MG capsule Take 0.5 mg by mouth every other day.        . fish oil-omega-3 fatty acids 1000 MG capsule Take 1 g by mouth daily.        . fluticasone (VERAMYST) 27.5 MCG/SPRAY nasal spray Place 1 spray into the nose daily.  10 g  5  . lamoTRIgine (LAMICTAL) 200 MG tablet Take 200 mg by mouth at bedtime.        Marland Kitchen levothyroxine (SYNTHROID, LEVOTHROID) 50 MCG tablet Take 1 tablet (50 mcg total) by mouth daily.  90 tablet  3  . Lutein 20 MG TABS Take 1 tablet by mouth daily.        . montelukast (SINGULAIR) 10 MG tablet Take 1 tablet (10 mg total) by mouth daily.  90 tablet  3  . Multiple Vitamins-Minerals (MULTIVITAMIN,TX-MINERALS) tablet Take 1 tablet by  mouth daily.        . Selenium 200 MCG CAPS Take 1 capsule by mouth daily.        . zaleplon (SONATA) 5 MG capsule Take 5 mg by mouth at bedtime.        . gabapentin (NEURONTIN) 300 MG capsule increase to 1 cap three times a day as directed.  90 capsule  2  . HYDROcodone-acetaminophen (NORCO) 5-325 MG per tablet Take 2 tablets by mouth every 6 (six) hours as needed for pain.  60 tablet  1  . omeprazole (PRILOSEC) 40 MG capsule Take 40 mg by mouth daily.        . potassium chloride (KLOR-CON) 10 MEQ CR tablet Take 10 mEq by mouth daily.          Allergies  Allergen Reactions  . Fluoxetine Hcl Other (See Comments)    "severe mental reaction"  . Azithromycin Diarrhea  . Cephalexin Rash  . Erythromycin Nausea Only  . Ketorolac Tromethamine Nausea And Vomiting    ROS:  13 systems were reviewed and aare unremarkable.  Exam: . Filed Vitals:   03/02/12 1424  BP: 122/80  Pulse: 88  Height: 6' (1.829 m)  Weight: 171 lb  (77.565 kg)    In general, well appearing man.  Mental status:   The patient is oriented to person, place and time. Recent and remote memory are intact. Attention span and concentration are normal. Language including repetition, naming, following commands are intact. Fund of knowledge of current and historical events, as well as vocabulary are normal.  Cranial Nerves: Both pupils reactive, but left mildly larger than right, but no ptosis. Visual fields full to confrontation. Extraocular movements are intact without nystagmus. Facial sensation and muscles of mastication are intact. Muscles of facial expression are symmetric. Hearing intact to bilateral finger rub. Tongue protrusion, uvula, palate midline.  Shoulder shrug intact  Motor:  Normal bulk and tone, no drift and 5/5 muscle strength bilaterally.  Reflexes:  Symmetric.  Coordination:  Normal finger to nose  Gait:  Normal gait and station.  Romberg negative.  Impression/Recommendations:  1.  Left ICA dissection - patient will continue aspirin 81mg .  I am going to check it one more time to make sure there is no significant luminal compromise.  If it is unchanged or better there is no need to reimage. 2.  Right leg numbness - Still am not convinced this is from an L4 radic.  However, he is going to try to stop Elavil to see if it gets worse.  If not change he will stay off elavil.  The patient will follow up with yourself.  Lupita Raider Modesto Charon, MD [facility], [place]

## 2012-03-17 ENCOUNTER — Ambulatory Visit (HOSPITAL_COMMUNITY)
Admission: RE | Admit: 2012-03-17 | Discharge: 2012-03-17 | Disposition: A | Discharge status: Home / Self Care | Payer: Federal, State, Local not specified - PPO | Source: Ambulatory Visit | Attending: Neurology | Admitting: Neurology

## 2012-03-17 ENCOUNTER — Ambulatory Visit (HOSPITAL_COMMUNITY): Payer: Federal, State, Local not specified - PPO

## 2012-03-17 DIAGNOSIS — I7771 Dissection of carotid artery: Secondary | ICD-10-CM

## 2012-03-17 MED ORDER — IOHEXOL 350 MG/ML SOLN
50.0000 mL | Freq: Once | INTRAVENOUS | Status: AC | PRN
  Administered 2012-03-17: 50 mL via INTRAVENOUS

## 2012-05-31 ENCOUNTER — Telehealth: Payer: Self-pay | Admitting: *Deleted

## 2012-05-31 DIAGNOSIS — E039 Hypothyroidism, unspecified: Secondary | ICD-10-CM

## 2012-05-31 DIAGNOSIS — I7771 Dissection of carotid artery: Secondary | ICD-10-CM

## 2012-05-31 NOTE — Telephone Encounter (Signed)
Pt has upcoming appointment on 12/13-spouse wants to know if pt needs to have lab work done before appointment-please advise.

## 2012-05-31 NOTE — Telephone Encounter (Signed)
BMET, TSH Thx 

## 2012-06-01 NOTE — Telephone Encounter (Signed)
Labs ordered, pt's spouse informed labs entered.

## 2012-06-17 ENCOUNTER — Other Ambulatory Visit (INDEPENDENT_AMBULATORY_CARE_PROVIDER_SITE_OTHER): Payer: Federal, State, Local not specified - PPO

## 2012-06-17 DIAGNOSIS — I7771 Dissection of carotid artery: Secondary | ICD-10-CM

## 2012-06-17 DIAGNOSIS — E039 Hypothyroidism, unspecified: Secondary | ICD-10-CM

## 2012-06-17 LAB — BASIC METABOLIC PANEL
BUN: 18 mg/dL (ref 6–23)
Calcium: 9.5 mg/dL (ref 8.4–10.5)
Creatinine, Ser: 0.8 mg/dL (ref 0.4–1.5)
GFR: 103.61 mL/min (ref >60.00)
Glucose, Bld: 112 mg/dL — ABNORMAL HIGH (ref 70–99)

## 2012-06-17 LAB — TSH: TSH: 1.47 u[IU]/mL (ref 0.35–5.50)

## 2012-06-25 ENCOUNTER — Encounter: Payer: Self-pay | Admitting: Internal Medicine

## 2012-06-25 ENCOUNTER — Ambulatory Visit (INDEPENDENT_AMBULATORY_CARE_PROVIDER_SITE_OTHER): Payer: Federal, State, Local not specified - PPO | Admitting: Internal Medicine

## 2012-06-25 VITALS — BP 132/86 | HR 80 | Temp 98.1°F | Resp 16 | Wt 169.0 lb

## 2012-06-25 DIAGNOSIS — Z23 Encounter for immunization: Secondary | ICD-10-CM

## 2012-06-25 DIAGNOSIS — R972 Elevated prostate specific antigen [PSA]: Secondary | ICD-10-CM

## 2012-06-25 DIAGNOSIS — F3289 Other specified depressive episodes: Secondary | ICD-10-CM

## 2012-06-25 DIAGNOSIS — N400 Enlarged prostate without lower urinary tract symptoms: Secondary | ICD-10-CM

## 2012-06-25 DIAGNOSIS — Z2911 Encounter for prophylactic immunotherapy for respiratory syncytial virus (RSV): Secondary | ICD-10-CM

## 2012-06-25 DIAGNOSIS — F329 Major depressive disorder, single episode, unspecified: Secondary | ICD-10-CM

## 2012-06-25 MED ORDER — MONTELUKAST SODIUM 10 MG PO TABS: 10.0000 mg | ORAL_TABLET | Freq: Every day | ORAL | Status: DC

## 2012-06-25 MED ORDER — FLUTICASONE FUROATE 27.5 MCG/SPRAY NA SUSP: 1.0000 | Freq: Every day | NASAL | Status: DC

## 2012-06-25 MED ORDER — LEVOTHYROXINE SODIUM 50 MCG PO TABS: 50.0000 ug | ORAL_TABLET | Freq: Every day | ORAL | Status: DC

## 2012-06-25 NOTE — Assessment & Plan Note (Signed)
Continue with current prescription therapy as reflected on the Med list.  

## 2012-06-25 NOTE — Assessment & Plan Note (Signed)
Per Urology  

## 2012-06-25 NOTE — Progress Notes (Signed)
   Subjective:    Patient ID: Adam Keller, male    DOB: 06-22-1956, 56 y.o.   MRN: [id number]  HPI  The patient presents for a follow-up of  chronic hypertension, chronic dyslipidemia, elev PSA, hypothyroidism, controlled with medicines  Wife is sick with post-THR Staph - better/stable -surg Aug 26. He is planning to retire in Oct 2013  Wt Readings from Last 3 Encounters:  06/25/12 169 lb (76.658 kg)  03/02/12 171 lb (77.565 kg)  02/20/12 169 lb (76.658 kg)   BP Readings from Last 3 Encounters:  06/25/12 132/86  03/02/12 122/80  02/20/12 120/80       Review of Systems  Constitutional: Negative for appetite change, fatigue and unexpected weight change (lost).  HENT: Negative for nosebleeds, congestion, sore throat, sneezing, trouble swallowing and neck pain.   Eyes: Negative for itching and visual disturbance.  Respiratory: Negative for cough.   Cardiovascular: Negative for chest pain, palpitations and leg swelling.  Gastrointestinal: Negative for nausea, diarrhea, blood in stool and abdominal distention.  Genitourinary: Negative for frequency and hematuria.  Musculoskeletal: Negative for back pain, joint swelling and gait problem.  Skin: Negative for rash.  Neurological: Negative for dizziness, tremors, speech difficulty and weakness.  Psychiatric/Behavioral: Negative for sleep disturbance, dysphoric mood and agitation. The patient is not nervous/anxious.    Not suicidal     Objective:   Physical Exam  Constitutional: He is oriented to person, place, and time. He appears well-developed.  HENT:  Mouth/Throat: Oropharynx is clear and moist.  Eyes: Conjunctivae normal are normal. Pupils are equal, round, and reactive to light.  Neck: Normal range of motion. No JVD present. No thyromegaly present.  Cardiovascular: Normal rate, regular rhythm, normal heart sounds and intact distal pulses.  Exam reveals no gallop and no friction rub.   No murmur heard. Pulmonary/Chest:  Effort normal and breath sounds normal. No respiratory distress. He has no wheezes. He has no rales. He exhibits no tenderness.  Abdominal: Soft. Bowel sounds are normal. He exhibits no distension and no mass. There is no tenderness. There is no rebound and no guarding.  Musculoskeletal: Normal range of motion. He exhibits no edema and no tenderness.  Lymphadenopathy:    He has no cervical adenopathy.  Neurological: He is alert and oriented to person, place, and time. He has normal reflexes. No cranial nerve deficit. He exhibits normal muscle tone. Coordination normal.  Skin: Skin is warm and dry. No rash noted.  Psychiatric: Judgment and thought content normal.   Hoarse, less weak voice      Lab Results  Component Value Date   WBC 4.7 02/26/2010   HGB 16.7 02/26/2010   HCT 47.5 02/26/2010   PLT 180.0 02/26/2010   CHOL 201* 01/02/2011   TRIG 37.0 01/02/2011   HDL 54.00 01/02/2011   LDLDIRECT 116.5 01/02/2011   ALT 12 02/26/2010   AST 18 02/26/2010   NA 140 06/17/2012   K 4.5 06/17/2012   CL 106 06/17/2012   CREATININE 0.8 06/17/2012   BUN 18 06/17/2012   CO2 25 06/17/2012   TSH 1.47 06/17/2012   PSA 6.50* 02/26/2010   HGBA1C 5.3 11/03/2008     Assessment & Plan:

## 2012-10-28 ENCOUNTER — Other Ambulatory Visit (INDEPENDENT_AMBULATORY_CARE_PROVIDER_SITE_OTHER): Payer: Federal, State, Local not specified - PPO

## 2012-10-28 ENCOUNTER — Encounter: Payer: Self-pay | Admitting: Internal Medicine

## 2012-10-28 ENCOUNTER — Ambulatory Visit (INDEPENDENT_AMBULATORY_CARE_PROVIDER_SITE_OTHER): Payer: Federal, State, Local not specified - PPO | Admitting: Internal Medicine

## 2012-10-28 VITALS — BP 138/92 | HR 68 | Temp 98.1°F | Resp 16 | Wt 172.0 lb

## 2012-10-28 DIAGNOSIS — R972 Elevated prostate specific antigen [PSA]: Secondary | ICD-10-CM

## 2012-10-28 DIAGNOSIS — E039 Hypothyroidism, unspecified: Secondary | ICD-10-CM

## 2012-10-28 DIAGNOSIS — I7771 Dissection of carotid artery: Secondary | ICD-10-CM

## 2012-10-28 DIAGNOSIS — R739 Hyperglycemia, unspecified: Secondary | ICD-10-CM

## 2012-10-28 DIAGNOSIS — N400 Enlarged prostate without lower urinary tract symptoms: Secondary | ICD-10-CM

## 2012-10-28 DIAGNOSIS — R7309 Other abnormal glucose: Secondary | ICD-10-CM

## 2012-10-28 DIAGNOSIS — F329 Major depressive disorder, single episode, unspecified: Secondary | ICD-10-CM

## 2012-10-28 LAB — BASIC METABOLIC PANEL
BUN: 10 mg/dL (ref 6–23)
Creatinine, Ser: 0.8 mg/dL (ref 0.4–1.5)
GFR: 109.61 mL/min (ref >60.00)
Glucose, Bld: 98 mg/dL (ref 70–99)
Potassium: 4.1 mEq/L (ref 3.5–5.1)

## 2012-10-28 NOTE — Assessment & Plan Note (Signed)
No relapse Continue with current prescription therapy as reflected on the Med list.  

## 2012-10-28 NOTE — Assessment & Plan Note (Signed)
Continue with current prescription therapy as reflected on the Med list.  

## 2012-10-28 NOTE — Assessment & Plan Note (Signed)
Watching  

## 2012-10-28 NOTE — Progress Notes (Signed)
   Subjective:     HPI  The patient presents for a follow-up of  chronic hypertension, chronic dyslipidemia, elev PSA, hypothyroidism, controlled with medicines  Wife is sick with post-THR Staph - better/stable -surg Aug 26. She got the wound infected again... He  retired in Oct 2013   Wt Readings from Last 3 Encounters:  10/28/12 172 lb (78.019 kg)  06/25/12 169 lb (76.658 kg)  03/02/12 171 lb (77.565 kg)   BP Readings from Last 3 Encounters:  10/28/12 138/92  06/25/12 132/86  03/02/12 122/80       Review of Systems  Constitutional: Negative for appetite change, fatigue and unexpected weight change (lost).  HENT: Negative for nosebleeds, congestion, sore throat, sneezing, trouble swallowing and neck pain.   Eyes: Negative for itching and visual disturbance.  Respiratory: Negative for cough.   Cardiovascular: Negative for chest pain, palpitations and leg swelling.  Gastrointestinal: Negative for nausea, diarrhea, blood in stool and abdominal distention.  Genitourinary: Negative for frequency and hematuria.  Musculoskeletal: Negative for back pain, joint swelling and gait problem.  Skin: Negative for rash.  Neurological: Negative for dizziness, tremors, speech difficulty and weakness.  Psychiatric/Behavioral: Negative for sleep disturbance, dysphoric mood and agitation. The patient is not nervous/anxious.    Not suicidal     Objective:   Physical Exam  Constitutional: He is oriented to person, place, and time. He appears well-developed.  HENT:  Mouth/Throat: Oropharynx is clear and moist.  Eyes: Conjunctivae are normal. Pupils are equal, round, and reactive to light.  Neck: Normal range of motion. No JVD present. No thyromegaly present.  Cardiovascular: Normal rate, regular rhythm, normal heart sounds and intact distal pulses.  Exam reveals no gallop and no friction rub.   No murmur heard. Pulmonary/Chest: Effort normal and breath sounds normal. No respiratory  distress. He has no wheezes. He has no rales. He exhibits no tenderness.  Abdominal: Soft. Bowel sounds are normal. He exhibits no distension and no mass. There is no tenderness. There is no rebound and no guarding.  Musculoskeletal: Normal range of motion. He exhibits no edema and no tenderness.  Lymphadenopathy:    He has no cervical adenopathy.  Neurological: He is alert and oriented to person, place, and time. He has normal reflexes. No cranial nerve deficit. He exhibits normal muscle tone. Coordination normal.  Skin: Skin is warm and dry. No rash noted.  Psychiatric: Judgment and thought content normal.    less weak voice      Lab Results  Component Value Date   WBC 4.7 02/26/2010   HGB 16.7 02/26/2010   HCT 47.5 02/26/2010   PLT 180.0 02/26/2010   CHOL 201* 01/02/2011   TRIG 37.0 01/02/2011   HDL 54.00 01/02/2011   LDLDIRECT 116.5 01/02/2011   ALT 12 02/26/2010   AST 18 02/26/2010   NA 140 06/17/2012   K 4.5 06/17/2012   CL 106 06/17/2012   CREATININE 0.8 06/17/2012   BUN 18 06/17/2012   CO2 25 06/17/2012   TSH 1.47 06/17/2012   PSA 6.50* 02/26/2010   HGBA1C 5.3 11/03/2008     Assessment & Plan:

## 2013-04-22 ENCOUNTER — Ambulatory Visit (INDEPENDENT_AMBULATORY_CARE_PROVIDER_SITE_OTHER): Payer: Federal, State, Local not specified - PPO | Admitting: Internal Medicine

## 2013-04-22 ENCOUNTER — Encounter: Payer: Self-pay | Admitting: Internal Medicine

## 2013-04-22 ENCOUNTER — Other Ambulatory Visit (INDEPENDENT_AMBULATORY_CARE_PROVIDER_SITE_OTHER): Payer: Federal, State, Local not specified - PPO

## 2013-04-22 VITALS — BP 132/98 | HR 76 | Temp 97.0°F | Resp 12 | Wt 172.0 lb

## 2013-04-22 DIAGNOSIS — R609 Edema, unspecified: Secondary | ICD-10-CM

## 2013-04-22 DIAGNOSIS — E039 Hypothyroidism, unspecified: Secondary | ICD-10-CM

## 2013-04-22 DIAGNOSIS — F3289 Other specified depressive episodes: Secondary | ICD-10-CM

## 2013-04-22 DIAGNOSIS — F329 Major depressive disorder, single episode, unspecified: Secondary | ICD-10-CM

## 2013-04-22 DIAGNOSIS — Z23 Encounter for immunization: Secondary | ICD-10-CM

## 2013-04-22 LAB — BASIC METABOLIC PANEL
CO2: 26 mEq/L (ref 19–32)
Calcium: 9.5 mg/dL (ref 8.4–10.5)
Creatinine, Ser: 0.8 mg/dL (ref 0.4–1.5)
GFR: 107.83 mL/min (ref >60.00)
Glucose, Bld: 105 mg/dL — ABNORMAL HIGH (ref 70–99)
Sodium: 140 mEq/L (ref 135–145)

## 2013-04-22 NOTE — Progress Notes (Signed)
   Subjective:     HPI  The patient presents for a follow-up of  chronic hypertension, chronic dyslipidemia, elev PSA, hypothyroidism, controlled with medicines  Wife is sick with post-THR Staph - better/stable -surg Mar 08 2012. She got the wound infected again... He  retired in Oct 2013 Thinking of moving to [facility] Readings from Last 3 Encounters:  04/22/13 172 lb (78.019 kg)  10/28/12 172 lb (78.019 kg)  06/25/12 169 lb (76.658 kg)   BP Readings from Last 3 Encounters:  04/22/13 132/98  10/28/12 138/92  06/25/12 132/86       Review of Systems  Constitutional: Negative for appetite change, fatigue and unexpected weight change (lost).  HENT: Negative for congestion, nosebleeds, sneezing, sore throat and trouble swallowing.   Eyes: Negative for itching and visual disturbance.  Respiratory: Negative for cough.   Cardiovascular: Negative for chest pain, palpitations and leg swelling.  Gastrointestinal: Negative for nausea, diarrhea, blood in stool and abdominal distention.  Genitourinary: Negative for frequency and hematuria.  Musculoskeletal: Negative for back pain, gait problem, joint swelling and neck pain.  Skin: Negative for rash.  Neurological: Negative for dizziness, tremors, speech difficulty and weakness.  Psychiatric/Behavioral: Negative for sleep disturbance, dysphoric mood and agitation. The patient is not nervous/anxious.    Not suicidal     Objective:   Physical Exam  Constitutional: He is oriented to person, place, and time. He appears well-developed.  HENT:  Mouth/Throat: Oropharynx is clear and moist.  Eyes: Conjunctivae are normal. Pupils are equal, round, and reactive to light.  Neck: Normal range of motion. No JVD present. No thyromegaly present.  Cardiovascular: Normal rate, regular rhythm, normal heart sounds and intact distal pulses.  Exam reveals no gallop and no friction rub.   No murmur heard. Pulmonary/Chest: Effort normal and  breath sounds normal. No respiratory distress. He has no wheezes. He has no rales. He exhibits no tenderness.  Abdominal: Soft. Bowel sounds are normal. He exhibits no distension and no mass. There is no tenderness. There is no rebound and no guarding.  Musculoskeletal: Normal range of motion. He exhibits no edema and no tenderness.  Lymphadenopathy:    He has no cervical adenopathy.  Neurological: He is alert and oriented to person, place, and time. He has normal reflexes. No cranial nerve deficit. He exhibits normal muscle tone. Coordination normal.  Skin: Skin is warm and dry. No rash noted.  Psychiatric: Judgment and thought content normal.    less weak voice      Lab Results  Component Value Date   WBC 4.7 02/26/2010   HGB 16.7 02/26/2010   HCT 47.5 02/26/2010   PLT 180.0 02/26/2010   CHOL 201* 01/02/2011   TRIG 37.0 01/02/2011   HDL 54.00 01/02/2011   LDLDIRECT 116.5 01/02/2011   ALT 12 02/26/2010   AST 18 02/26/2010   NA 139 10/28/2012   K 4.1 10/28/2012   CL 105 10/28/2012   CREATININE 0.8 10/28/2012   BUN 10 10/28/2012   CO2 26 10/28/2012   TSH 1.47 06/17/2012   PSA 6.50* 02/26/2010   HGBA1C 5.1 10/28/2012     Assessment & Plan:

## 2013-04-22 NOTE — Assessment & Plan Note (Signed)
Doing well 

## 2013-04-22 NOTE — Assessment & Plan Note (Signed)
Resolved

## 2013-04-22 NOTE — Assessment & Plan Note (Signed)
Continue with current prescription therapy as reflected on the Med list.  

## 2013-04-29 ENCOUNTER — Ambulatory Visit: Payer: Federal, State, Local not specified - PPO | Admitting: Internal Medicine

## 2013-07-08 ENCOUNTER — Other Ambulatory Visit: Payer: Self-pay | Admitting: Internal Medicine

## 2013-07-08 NOTE — Telephone Encounter (Signed)
Refill done.  

## 2013-09-05 ENCOUNTER — Other Ambulatory Visit: Payer: Self-pay | Admitting: Internal Medicine

## 2013-09-26 ENCOUNTER — Encounter: Payer: Self-pay | Admitting: Internal Medicine

## 2013-09-30 ENCOUNTER — Other Ambulatory Visit: Payer: Self-pay | Admitting: Internal Medicine

## 2013-09-30 MED ORDER — FLUTICASONE FUROATE 27.5 MCG/SPRAY NA SUSP: 1.0000 | Freq: Every day | NASAL | Status: DC

## 2013-10-27 ENCOUNTER — Ambulatory Visit (INDEPENDENT_AMBULATORY_CARE_PROVIDER_SITE_OTHER): Payer: Federal, State, Local not specified - PPO | Admitting: Internal Medicine

## 2013-10-27 ENCOUNTER — Other Ambulatory Visit (INDEPENDENT_AMBULATORY_CARE_PROVIDER_SITE_OTHER): Payer: Federal, State, Local not specified - PPO

## 2013-10-27 ENCOUNTER — Encounter: Payer: Self-pay | Admitting: Internal Medicine

## 2013-10-27 VITALS — BP 128/76 | HR 72 | Temp 97.7°F | Resp 16 | Wt 181.0 lb

## 2013-10-27 DIAGNOSIS — R739 Hyperglycemia, unspecified: Secondary | ICD-10-CM

## 2013-10-27 DIAGNOSIS — E039 Hypothyroidism, unspecified: Secondary | ICD-10-CM

## 2013-10-27 DIAGNOSIS — R7309 Other abnormal glucose: Secondary | ICD-10-CM

## 2013-10-27 DIAGNOSIS — R972 Elevated prostate specific antigen [PSA]: Secondary | ICD-10-CM

## 2013-10-27 LAB — BASIC METABOLIC PANEL
BUN: 13 mg/dL (ref 6–23)
CHLORIDE: 103 meq/L (ref 96–112)
CO2: 26 mEq/L (ref 19–32)
Calcium: 9.6 mg/dL (ref 8.4–10.5)
Creatinine, Ser: 0.9 mg/dL (ref 0.4–1.5)
GFR: 98.91 mL/min (ref >60.00)
Glucose, Bld: 99 mg/dL (ref 70–99)
POTASSIUM: 4.3 meq/L (ref 3.5–5.1)
Sodium: 137 mEq/L (ref 135–145)

## 2013-10-27 LAB — TSH: TSH: 1.21 u[IU]/mL (ref 0.35–5.50)

## 2013-10-27 NOTE — Assessment & Plan Note (Addendum)
Free PSA per Urology q 6 mo

## 2013-10-27 NOTE — Assessment & Plan Note (Signed)
A1c

## 2013-10-27 NOTE — Assessment & Plan Note (Signed)
Continue with current prescription therapy as reflected on the Med list.  

## 2013-10-27 NOTE — Progress Notes (Signed)
   Subjective:     HPI  C/o ST, nasal d/c  The patient presents for a follow-up of  chronic hypertension, chronic dyslipidemia, elev PSA, hypothyroidism, controlled with medicines  Wife is sick with post-THR Staph - last surg Mar 08 2012. She got the wound infected again.Marland KitchenMarland Kitchen4/15 worse now  He  retired in Oct 2013 Thinking of moving to [facility] Readings from Last 3 Encounters:  10/27/13 181 lb (82.101 kg)  04/22/13 172 lb (78.019 kg)  10/28/12 172 lb (78.019 kg)   BP Readings from Last 3 Encounters:  10/27/13 128/76  04/22/13 132/98  10/28/12 138/92       Review of Systems  Constitutional: Negative for appetite change, fatigue and unexpected weight change (lost).  HENT: Negative for congestion, nosebleeds, sneezing, sore throat and trouble swallowing.   Eyes: Negative for itching and visual disturbance.  Respiratory: Negative for cough.   Cardiovascular: Negative for chest pain, palpitations and leg swelling.  Gastrointestinal: Negative for nausea, diarrhea, blood in stool and abdominal distention.  Genitourinary: Negative for frequency and hematuria.  Musculoskeletal: Negative for back pain, gait problem, joint swelling and neck pain.  Skin: Negative for rash.  Neurological: Negative for dizziness, tremors, speech difficulty and weakness.  Psychiatric/Behavioral: Negative for sleep disturbance, dysphoric mood and agitation. The patient is not nervous/anxious.    Not suicidal     Objective:   Physical Exam  Constitutional: He is oriented to person, place, and time. He appears well-developed.  HENT:  Mouth/Throat: Oropharynx is clear and moist.  Eyes: Conjunctivae are normal. Pupils are equal, round, and reactive to light.  Neck: Normal range of motion. No JVD present. No thyromegaly present.  Cardiovascular: Normal rate, regular rhythm, normal heart sounds and intact distal pulses.  Exam reveals no gallop and no friction rub.   No murmur  heard. Pulmonary/Chest: Effort normal and breath sounds normal. No respiratory distress. He has no wheezes. He has no rales. He exhibits no tenderness.  Abdominal: Soft. Bowel sounds are normal. He exhibits no distension and no mass. There is no tenderness. There is no rebound and no guarding.  Musculoskeletal: Normal range of motion. He exhibits no edema and no tenderness.  Lymphadenopathy:    He has no cervical adenopathy.  Neurological: He is alert and oriented to person, place, and time. He has normal reflexes. No cranial nerve deficit. He exhibits normal muscle tone. Coordination normal.  Skin: Skin is warm and dry. No rash noted.  Psychiatric: Judgment and thought content normal.    less weak voice      Lab Results  Component Value Date   WBC 4.7 02/26/2010   HGB 16.7 02/26/2010   HCT 47.5 02/26/2010   PLT 180.0 02/26/2010   CHOL 201* 01/02/2011   TRIG 37.0 01/02/2011   HDL 54.00 01/02/2011   LDLDIRECT 116.5 01/02/2011   ALT 12 02/26/2010   AST 18 02/26/2010   NA 140 04/22/2013   K 4.3 04/22/2013   CL 105 04/22/2013   CREATININE 0.8 04/22/2013   BUN 11 04/22/2013   CO2 26 04/22/2013   TSH 1.99 04/22/2013   PSA 6.50* 02/26/2010   HGBA1C 5.1 10/28/2012     Assessment & Plan:

## 2013-10-27 NOTE — Progress Notes (Signed)
Pre visit review using our clinic review tool, if applicable. No additional management support is needed unless otherwise documented below in the visit note. 

## 2013-11-08 ENCOUNTER — Emergency Department: Payer: Self-pay | Admitting: Emergency Medicine

## 2013-11-08 ENCOUNTER — Telehealth: Payer: Self-pay | Admitting: Internal Medicine

## 2013-11-08 LAB — COMPREHENSIVE METABOLIC PANEL
AST: 24 U/L (ref 15–37)
Albumin: 4.6 g/dL (ref 3.4–5.0)
Alkaline Phosphatase: 93 U/L
Anion Gap: 6 — ABNORMAL LOW (ref 7–16)
BUN: 16 mg/dL (ref 7–18)
Bilirubin,Total: 0.6 mg/dL (ref 0.2–1.0)
CALCIUM: 10 mg/dL (ref 8.5–10.1)
CO2: 28 mmol/L (ref 21–32)
Chloride: 103 mmol/L (ref 98–107)
Creatinine: 0.8 mg/dL (ref 0.60–1.30)
EGFR (African American): >60
EGFR (Non-African Amer.): >60
GLUCOSE: 97 mg/dL (ref 65–99)
OSMOLALITY: 275 (ref 275–301)
POTASSIUM: 3.8 mmol/L (ref 3.5–5.1)
SGPT (ALT): 19 U/L (ref 12–78)
SODIUM: 137 mmol/L (ref 136–145)
Total Protein: 7.5 g/dL (ref 6.4–8.2)

## 2013-11-08 LAB — CBC
HCT: 48.3 % (ref 40.0–52.0)
HGB: 16.5 g/dL (ref 13.0–18.0)
MCH: 32.3 pg (ref 26.0–34.0)
MCHC: 34.2 g/dL (ref 32.0–36.0)
MCV: 94 fL (ref 80–100)
Platelet: 188 10*3/uL (ref 150–440)
RBC: 5.12 10*6/uL (ref 4.40–5.90)
RDW: 12.6 % (ref 11.5–14.5)
WBC: 5.4 10*3/uL (ref 3.8–10.6)

## 2013-11-08 NOTE — Telephone Encounter (Signed)
Patient Information:  Caller Name: [name]  Phone: [phone]  Patient: Adam Keller, Adam Keller  Gender: Male  DOB: 1955/11/01  Age: 58 Years  PCP: Plotnikov, Alex (Adults only)  Office Follow Up:  Does the office need to follow up with this patient?: No  Instructions For The Office: N/A  RN Note:  Office contacted per Profile.  Misha-office contact advised that office does not have any appt and to send to ED.  Pt agrees to ED dispo and will go to [facility]  Symptoms  Reason For Call & Symptoms: Rectal bleeding started 4/23 and has gotten worse today.  Pt had BM, saw blood in toilet.  Bright red blood in toilet and when he wiped.  Once today.  2 BM's today the 1st one no blood.  Pt reports that he has minor cramping at this time.  Pt denies hemmroids and also has never had a colonoscopy.  No hx of rectal bleeding.  Reviewed Health History In EMR: Yes  Reviewed Medications In EMR: Yes  Reviewed Allergies In EMR: Yes  Reviewed Surgeries / Procedures: Yes  Date of Onset of Symptoms: 11/03/2013  Guideline(s) Used:  Rectal Bleeding  Disposition Per Guideline:   Go to ED Now (or to Office with PCP Approval)  Reason For Disposition Reached:   Bloody, black, or tarry bowel movements  Advice Given:  N/A  Patient Will Follow Care Advice:  YES

## 2013-11-09 ENCOUNTER — Telehealth: Payer: Self-pay | Admitting: Internal Medicine

## 2013-11-09 DIAGNOSIS — K921 Melena: Secondary | ICD-10-CM

## 2013-11-09 NOTE — Telephone Encounter (Signed)
Pt went to [facility] yesterday for rectal bleeding.  He was told to follow up with a GI doctor.  He needs a referral for GI.

## 2013-11-10 ENCOUNTER — Encounter: Payer: Self-pay | Admitting: Gastroenterology

## 2013-11-10 ENCOUNTER — Encounter: Payer: Self-pay | Admitting: Internal Medicine

## 2013-11-10 NOTE — Telephone Encounter (Signed)
Ok Done - Dr Lollie Marrow

## 2013-11-10 NOTE — Telephone Encounter (Signed)
pls move this appt up due to persistent rectal bleeding - any GI provider Thx

## 2013-11-15 NOTE — Telephone Encounter (Signed)
Pt is aware.  

## 2014-01-04 ENCOUNTER — Ambulatory Visit (INDEPENDENT_AMBULATORY_CARE_PROVIDER_SITE_OTHER): Payer: Federal, State, Local not specified - PPO | Admitting: Gastroenterology

## 2014-01-04 ENCOUNTER — Encounter: Payer: Self-pay | Admitting: Gastroenterology

## 2014-01-04 VITALS — BP 120/78 | HR 80 | Ht 72.0 in | Wt 174.0 lb

## 2014-01-04 DIAGNOSIS — K921 Melena: Secondary | ICD-10-CM

## 2014-01-04 MED ORDER — PEG-KCL-NACL-NASULF-NA ASC-C 100 G PO SOLR: 1.0000 | Freq: Once | ORAL | Status: DC

## 2014-01-04 NOTE — Progress Notes (Signed)
    History of Present Illness: This is a 58 year old male who relates several episodes of red blood per rectum in April. He was seen at [facility] ED and DRE revealed blood and heme positive tool. Since the end of April he has had no further bleeding. He has no gastrointestinal complaints. He has not previously had a colonoscopy. Denies weight loss, abdominal pain, constipation, diarrhea, change in stool caliber, melena,  nausea, vomiting, dysphagia, reflux symptoms, chest pain.  Review of Systems: Pertinent positive and negative review of systems were noted in the above HPI section. All other review of systems were otherwise negative.  Current Medications, Allergies, Past Medical History, Past Surgical History, Family History and Social History were reviewed in [employer] record.  Physical Exam: General: Well developed , well nourished, no acute distress Head: Normocephalic and atraumatic Eyes:  sclerae anicteric, EOMI Ears: Normal auditory acuity Mouth: No deformity or lesions Neck: Supple, no masses or thyromegaly Lungs: Clear throughout to auscultation Heart: Regular rate and rhythm; no murmurs, rubs or bruits Abdomen: Soft, non tender and non distended. No masses, hepatosplenomegaly or hernias noted. Normal Bowel sounds Rectal: Deferred to colonoscopy. Recent DRE as outlined in HPI  Musculoskeletal: Symmetrical with no gross deformities  Skin: No lesions on visible extremities Pulses:  Normal pulses noted Extremities: No clubbing, cyanosis, edema or deformities noted Neurological: Alert oriented x 4, grossly nonfocal Cervical Nodes:  No significant cervical adenopathy Inguinal Nodes: No significant inguinal adenopathy Psychological:  Alert and cooperative. Normal mood and affect  Assessment and Recommendations:  1. Hematochezia. Rule out hemorrhoids, proctitis, colorectal neoplasms and other disorders. Schedule colonoscopy. The risks, benefits, and alternatives  to colonoscopy with possible biopsy, possible destruction of internal hemorrhoids and possible polypectomy were discussed with the patient and they consent to proceed.

## 2014-01-04 NOTE — Patient Instructions (Signed)
You have been scheduled for a colonoscopy. Please follow written instructions given to you at your visit today.  Please pick up your prep kit at the pharmacy within the next 1-3 days. If you use inhalers (even only as needed), please bring them with you on the day of your procedure. Your physician has requested that you go to www.startemmi.com and enter the access code given to you at your visit today. This web site gives a general overview about your procedure. However, you should still follow specific instructions given to you by our office regarding your preparation for the procedure.  Thank you for choosing me and [facility].  Malcolm T. Stark, Jr., MD., FACG  

## 2014-01-09 ENCOUNTER — Encounter: Payer: Self-pay | Admitting: Gastroenterology

## 2014-01-09 ENCOUNTER — Telehealth: Payer: Self-pay | Admitting: Gastroenterology

## 2014-01-09 DIAGNOSIS — K921 Melena: Secondary | ICD-10-CM

## 2014-01-09 MED ORDER — PEG-KCL-NACL-NASULF-NA ASC-C 100 G PO SOLR: 1.0000 | Freq: Once | ORAL | Status: DC

## 2014-01-09 NOTE — Telephone Encounter (Signed)
Rx sent to [facility]. I have called [facility] who already d/c'ed rx since it was not a 90 day supply. Patient advised.

## 2014-01-20 ENCOUNTER — Ambulatory Visit (AMBULATORY_SURGERY_CENTER): Payer: Federal, State, Local not specified - PPO | Admitting: Gastroenterology

## 2014-01-20 ENCOUNTER — Encounter: Payer: Self-pay | Admitting: Gastroenterology

## 2014-01-20 VITALS — BP 136/102 | HR 77 | Temp 97.6°F | Resp 19 | Ht 73.0 in | Wt 174.0 lb

## 2014-01-20 DIAGNOSIS — D126 Benign neoplasm of colon, unspecified: Secondary | ICD-10-CM

## 2014-01-20 DIAGNOSIS — K921 Melena: Secondary | ICD-10-CM

## 2014-01-20 DIAGNOSIS — R195 Other fecal abnormalities: Secondary | ICD-10-CM

## 2014-01-20 DIAGNOSIS — D125 Benign neoplasm of sigmoid colon: Secondary | ICD-10-CM

## 2014-01-20 MED ORDER — SODIUM CHLORIDE 0.9 % IV SOLN: 500.0000 mL | INTRAVENOUS | Status: DC

## 2014-01-20 NOTE — Op Note (Signed)
[place]  [address]. [place], [id number]   COLONOSCOPY PROCEDURE REPORT  PATIENT: Adam Keller, Adam Keller.  MR#: [id number] BIRTHDATE: 08-Feb-1956 , 57  yrs. old GENDER: Male ENDOSCOPIST: Ladene Artist, MD, [facility] REFERRED [id number] Avel Sensor, M.D. PROCEDURE DATE:  01/20/2014 PROCEDURE:   Colonoscopy with snare polypectomy First Screening Colonoscopy - Avg.  risk and is 50 yrs.  old or older - No.  Prior Negative Screening - Now for repeat screening. N/A  History of Adenoma - Now for follow-up colonoscopy & has been > or = to 3 yrs.  N/A  Polyps Removed Today? Yes. ASA CLASS:   Class II INDICATIONS:hematochezia and heme-positive stool. MEDICATIONS: MAC sedation, administered by CRNA and propofol (Diprivan) 200mg  IV DESCRIPTION OF PROCEDURE:   After the risks benefits and alternatives of the procedure were thoroughly explained, informed consent was obtained.  A digital rectal exam revealed no abnormalities of the rectum.   The LB [id number]  endoscope was introduced through the anus and advanced to the cecum, which was identified by both the appendix and ileocecal valve. No adverse events experienced.   The quality of the prep was good, using MoviPrep  The instrument was then slowly withdrawn as the colon was fully examined.  COLON FINDINGS: A sessile polyp measuring 6 mm in size was found in the sigmoid colon.  A polypectomy was performed with a cold snare. The resection was complete and the polyp tissue was completely retrieved.   The colon was otherwise normal.  There was no diverticulosis, inflammation, polyps or cancers unless previously stated.  Retroflexed views revealed moderate internal hemorrhoids. The time to cecum=2 minutes 24 seconds.  Withdrawal time=11 minutes 28 seconds.  The scope was withdrawn and the procedure completed. COMPLICATIONS: There were no complications.  ENDOSCOPIC IMPRESSION: 1.   Sessile polyp measuring 6 mm in the sigmoid  colon; polypectomy performed with a cold snare 2.   Moderate internal hemorrhoids  RECOMMENDATIONS: 1.  Await pathology results 2.  Repeat colonoscopy in 5 years if polyp adenomatous; otherwise 10 years  eSigned:  Ladene Artist, MD, [facility] 01/20/2014 12:06 PM

## 2014-01-20 NOTE — Progress Notes (Signed)
A/ox3, pleased with MAC, report to RN 

## 2014-01-20 NOTE — Progress Notes (Signed)
Called to room to assist during endoscopic procedure.  Patient ID and intended procedure confirmed with present staff. Received instructions for my participation in the procedure from the performing physician.  

## 2014-01-20 NOTE — Patient Instructions (Signed)
YOU HAD AN ENDOSCOPIC PROCEDURE TODAY AT THE [facility]: Refer to the procedure report that was given to you for any specific questions about what was found during the examination.  If the procedure report does not answer your questions, please call your gastroenterologist to clarify.  If you requested that your care partner not be given the details of your procedure findings, then the procedure report has been included in a sealed envelope for you to review at your convenience later.  YOU SHOULD EXPECT: Some feelings of bloating in the abdomen. Passage of more gas than usual.  Walking can help get rid of the air that was put into your GI tract during the procedure and reduce the bloating. If you had a lower endoscopy (such as a colonoscopy or flexible sigmoidoscopy) you may notice spotting of blood in your stool or on the toilet paper. If you underwent a bowel prep for your procedure, then you may not have a normal bowel movement for a few days.  DIET: Your first meal following the procedure should be a light meal and then it is ok to progress to your normal diet.  A half-sandwich or bowl of soup is an example of a good first meal.  Heavy or fried foods are harder to digest and may make you feel nauseous or bloated.  Likewise meals heavy in dairy and vegetables can cause extra gas to form and this can also increase the bloating.  Drink plenty of fluids but you should avoid alcoholic beverages for 24 hours.  ACTIVITY: Your care partner should take you home directly after the procedure.  You should plan to take it easy, moving slowly for the rest of the day.  You can resume normal activity the day after the procedure however you should NOT DRIVE or use heavy machinery for 24 hours (because of the sedation medicines used during the test).    SYMPTOMS TO REPORT IMMEDIATELY: A gastroenterologist can be reached at any hour.  During normal business hours, 8:30 AM to 5:00 PM Monday through Friday,  call [phone].  After hours and on weekends, please call the GI answering service at [phone] who will take a message and have the physician on call contact you.   Following lower endoscopy (colonoscopy or flexible sigmoidoscopy):  Excessive amounts of blood in the stool  Significant tenderness or worsening of abdominal pains  Swelling of the abdomen that is new, acute  Fever of 100F or higher  FOLLOW UP: If any biopsies were taken you will be contacted by phone or by letter within the next 1-3 weeks.  Call your gastroenterologist if you have not heard about the biopsies in 3 weeks.  Our staff will call the home number listed on your records the next business day following your procedure to check on you and address any questions or concerns that you may have at that time regarding the information given to you following your procedure. This is a courtesy call and so if there is no answer at the home number and we have not heard from you through the emergency physician on call, we will assume that you have returned to your regular daily activities without incident.  SIGNATURES/CONFIDENTIALITY: You and/or your care partner have signed paperwork which will be entered into your electronic medical record.  These signatures attest to the fact that that the information above on your After Visit Summary has been reviewed and is understood.  Full responsibility of the confidentiality of this   discharge information lies with you and/or your care-partner.  Please continue your normal medications  Await pathology  Please read handouts about polyps, hemorrhoids and high fiber diets

## 2014-01-23 ENCOUNTER — Telehealth: Payer: Self-pay | Admitting: *Deleted

## 2014-01-23 NOTE — Telephone Encounter (Signed)
  Follow up Call-  Call back number 01/20/2014  Post procedure Call Back phone  # [phone]  Permission to leave phone message Yes     No answer, left message.

## 2014-01-30 ENCOUNTER — Encounter: Payer: Self-pay | Admitting: Gastroenterology

## 2014-03-17 ENCOUNTER — Other Ambulatory Visit: Payer: Self-pay | Admitting: Urology

## 2014-03-17 DIAGNOSIS — R972 Elevated prostate specific antigen [PSA]: Secondary | ICD-10-CM

## 2014-04-02 ENCOUNTER — Encounter: Payer: Self-pay | Admitting: Internal Medicine

## 2014-04-03 ENCOUNTER — Ambulatory Visit (HOSPITAL_COMMUNITY)
Admission: RE | Admit: 2014-04-03 | Discharge: 2014-04-03 | Disposition: A | Discharge status: Home / Self Care | Payer: Federal, State, Local not specified - PPO | Source: Ambulatory Visit | Attending: Urology | Admitting: Urology

## 2014-04-03 ENCOUNTER — Other Ambulatory Visit: Payer: Self-pay | Admitting: *Deleted

## 2014-04-03 DIAGNOSIS — N400 Enlarged prostate without lower urinary tract symptoms: Secondary | ICD-10-CM | POA: Diagnosis not present

## 2014-04-03 DIAGNOSIS — R972 Elevated prostate specific antigen [PSA]: Secondary | ICD-10-CM | POA: Diagnosis present

## 2014-04-03 MED ORDER — MONTELUKAST SODIUM 10 MG PO TABS: 10.0000 mg | ORAL_TABLET | Freq: Every day | ORAL | Status: DC

## 2014-04-03 MED ORDER — GADOBENATE DIMEGLUMINE 529 MG/ML IV SOLN
16.0000 mL | Freq: Once | INTRAVENOUS | Status: AC | PRN
  Administered 2014-04-03: 16 mL via INTRAVENOUS

## 2014-05-02 ENCOUNTER — Other Ambulatory Visit: Payer: Federal, State, Local not specified - PPO

## 2014-05-02 ENCOUNTER — Encounter: Payer: Federal, State, Local not specified - PPO | Admitting: Internal Medicine

## 2014-05-08 ENCOUNTER — Encounter: Payer: Self-pay | Admitting: Internal Medicine

## 2014-05-08 ENCOUNTER — Ambulatory Visit (INDEPENDENT_AMBULATORY_CARE_PROVIDER_SITE_OTHER): Payer: Federal, State, Local not specified - PPO | Admitting: Internal Medicine

## 2014-05-08 ENCOUNTER — Other Ambulatory Visit (INDEPENDENT_AMBULATORY_CARE_PROVIDER_SITE_OTHER): Payer: Federal, State, Local not specified - PPO

## 2014-05-08 VITALS — BP 138/90 | HR 80 | Temp 98.2°F | Resp 16 | Ht 72.5 in | Wt 176.0 lb

## 2014-05-08 DIAGNOSIS — I7771 Dissection of carotid artery: Secondary | ICD-10-CM

## 2014-05-08 DIAGNOSIS — Z0000 Encounter for general adult medical examination without abnormal findings: Secondary | ICD-10-CM

## 2014-05-08 DIAGNOSIS — Z23 Encounter for immunization: Secondary | ICD-10-CM

## 2014-05-08 LAB — LIPID PANEL
CHOLESTEROL: 247 mg/dL — AB (ref 0–200)
HDL: 42.3 mg/dL (ref >39.00)
LDL Cholesterol: 180 mg/dL — ABNORMAL HIGH (ref 0–99)
NONHDL: 204.7
Total CHOL/HDL Ratio: 6
Triglycerides: 124 mg/dL (ref 0.0–149.0)
VLDL: 24.8 mg/dL (ref 0.0–40.0)

## 2014-05-08 LAB — URINALYSIS
Bilirubin Urine: NEGATIVE
HGB URINE DIPSTICK: NEGATIVE
Ketones, ur: NEGATIVE
Leukocytes, UA: NEGATIVE
NITRITE: NEGATIVE
Specific Gravity, Urine: >=1.03 — AB (ref 1.000–1.030)
TOTAL PROTEIN, URINE-UPE24: NEGATIVE
URINE GLUCOSE: NEGATIVE
UROBILINOGEN UA: 0.2 (ref 0.0–1.0)
pH: 5.5 (ref 5.0–8.0)

## 2014-05-08 LAB — CBC WITH DIFFERENTIAL/PLATELET
BASOS ABS: 0 10*3/uL (ref 0.0–0.1)
BASOS PCT: 0.8 % (ref 0.0–3.0)
EOS ABS: 0.2 10*3/uL (ref 0.0–0.7)
Eosinophils Relative: 5.3 % — ABNORMAL HIGH (ref 0.0–5.0)
HEMATOCRIT: 50.8 % (ref 39.0–52.0)
HEMOGLOBIN: 17.5 g/dL — AB (ref 13.0–17.0)
LYMPHS ABS: 1.4 10*3/uL (ref 0.7–4.0)
Lymphocytes Relative: 31 % (ref 12.0–46.0)
MCHC: 34.4 g/dL (ref 30.0–36.0)
MCV: 94.9 fl (ref 78.0–100.0)
MONO ABS: 0.4 10*3/uL (ref 0.1–1.0)
Monocytes Relative: 8.7 % (ref 3.0–12.0)
NEUTROS ABS: 2.5 10*3/uL (ref 1.4–7.7)
Neutrophils Relative %: 54.2 % (ref 43.0–77.0)
Platelets: 196 10*3/uL (ref 150.0–400.0)
RBC: 5.36 Mil/uL (ref 4.22–5.81)
RDW: 12.4 % (ref 11.5–15.5)
WBC: 4.5 10*3/uL (ref 4.0–10.5)

## 2014-05-08 LAB — TSH: TSH: 3.32 u[IU]/mL (ref 0.35–4.50)

## 2014-05-08 LAB — HEPATIC FUNCTION PANEL
ALT: 14 U/L (ref 0–53)
AST: 25 U/L (ref 0–37)
Albumin: 4.2 g/dL (ref 3.5–5.2)
Alkaline Phosphatase: 80 U/L (ref 39–117)
Bilirubin, Direct: 0.2 mg/dL (ref 0.0–0.3)
TOTAL PROTEIN: 6.5 g/dL (ref 6.0–8.3)
Total Bilirubin: 1.1 mg/dL (ref 0.2–1.2)

## 2014-05-08 LAB — BASIC METABOLIC PANEL
BUN: 13 mg/dL (ref 6–23)
CHLORIDE: 106 meq/L (ref 96–112)
CO2: 27 meq/L (ref 19–32)
Calcium: 9.7 mg/dL (ref 8.4–10.5)
Creatinine, Ser: 1 mg/dL (ref 0.4–1.5)
GFR: 81.84 mL/min (ref >60.00)
Glucose, Bld: 92 mg/dL (ref 70–99)
POTASSIUM: 4.1 meq/L (ref 3.5–5.1)
SODIUM: 140 meq/L (ref 135–145)

## 2014-05-08 LAB — PSA: PSA: 5.2 ng/mL — ABNORMAL HIGH (ref 0.10–4.00)

## 2014-05-08 MED ORDER — LEVOTHYROXINE SODIUM 50 MCG PO TABS: 50.0000 ug | ORAL_TABLET | Freq: Every day | ORAL | Status: DC

## 2014-05-08 NOTE — Progress Notes (Signed)
   Subjective:     HPI  The patient is here for a wellness exam. The patient has been doing well overall without major physical or psychological issues going on lately.  The patient presents for a follow-up of  chronic hypertension, chronic dyslipidemia, elev PSA, hypothyroidism, controlled with medicines  Wife Adam Keller is sick with post-THR Staph - better/stable -surg Mar 08 2012. She got the wound infected again...  He  retired in Oct 2013  Still thinking of moving to [facility] Readings from Last 3 Encounters:  05/08/14 176 lb (79.833 kg)  01/20/14 174 lb (78.926 kg)  01/04/14 174 lb (78.926 kg)   BP Readings from Last 3 Encounters:  05/08/14 138/90  01/20/14 136/102  01/04/14 120/78       Review of Systems  Constitutional: Negative for appetite change, fatigue and unexpected weight change (lost).  HENT: Negative for congestion, nosebleeds, sneezing, sore throat and trouble swallowing.   Eyes: Negative for itching and visual disturbance.  Respiratory: Negative for cough.   Cardiovascular: Negative for chest pain, palpitations and leg swelling.  Gastrointestinal: Negative for nausea, diarrhea, blood in stool and abdominal distention.  Genitourinary: Negative for frequency and hematuria.  Musculoskeletal: Negative for back pain, gait problem, joint swelling and neck pain.  Skin: Negative for rash.  Neurological: Negative for dizziness, tremors, speech difficulty and weakness.  Psychiatric/Behavioral: Negative for sleep disturbance, dysphoric mood and agitation. The patient is not nervous/anxious.    Not suicidal     Objective:   Physical Exam  Constitutional: He is oriented to person, place, and time. He appears well-developed.  HENT:  Mouth/Throat: Oropharynx is clear and moist.  Eyes: Conjunctivae are normal. Pupils are equal, round, and reactive to light.  Neck: Normal range of motion. No JVD present. No thyromegaly present.  Cardiovascular: Normal rate,  regular rhythm, normal heart sounds and intact distal pulses.  Exam reveals no gallop and no friction rub.   No murmur heard. Pulmonary/Chest: Effort normal and breath sounds normal. No respiratory distress. He has no wheezes. He has no rales. He exhibits no tenderness.  Abdominal: Soft. Bowel sounds are normal. He exhibits no distension and no mass. There is no tenderness. There is no rebound and no guarding.  Genitourinary:  Rectal - per Dr Diona Fanti  Musculoskeletal: Normal range of motion. He exhibits no edema and no tenderness.  Lymphadenopathy:    He has no cervical adenopathy.  Neurological: He is alert and oriented to person, place, and time. He has normal reflexes. No cranial nerve deficit. He exhibits normal muscle tone. Coordination normal.  Skin: Skin is warm and dry. No rash noted.  Psychiatric: Judgment and thought content normal.  He has a less weak voice   EKG     Lab Results  Component Value Date   WBC 4.7 02/26/2010   HGB 16.7 02/26/2010   HCT 47.5 02/26/2010   PLT 180.0 02/26/2010   CHOL 201* 01/02/2011   TRIG 37.0 01/02/2011   HDL 54.00 01/02/2011   LDLDIRECT 116.5 01/02/2011   ALT 12 02/26/2010   AST 18 02/26/2010   NA 137 10/27/2013   K 4.3 10/27/2013   CL 103 10/27/2013   CREATININE 0.9 10/27/2013   BUN 13 10/27/2013   CO2 26 10/27/2013   TSH 1.21 10/27/2013   PSA 6.50* 02/26/2010   HGBA1C 5.1 10/28/2012     Assessment & Plan:

## 2014-05-08 NOTE — Progress Notes (Signed)
Pre visit review using our clinic review tool, if applicable. No additional management support is needed unless otherwise documented below in the visit note. 

## 2014-05-08 NOTE — Assessment & Plan Note (Addendum)
We discussed age appropriate health related issues, including available/recomended screening tests and vaccinations. We discussed a need for adhering to healthy diet and exercise. Labs/EKG were reviewed/ordered. All questions were answered. Colon 2015 Rectal Dr Diona Fanti - had an MRI Labs

## 2014-05-08 NOTE — Assessment & Plan Note (Signed)
ASA No relapse 

## 2014-11-13 ENCOUNTER — Encounter: Payer: Self-pay | Admitting: Internal Medicine

## 2014-11-13 ENCOUNTER — Other Ambulatory Visit (INDEPENDENT_AMBULATORY_CARE_PROVIDER_SITE_OTHER): Payer: Federal, State, Local not specified - PPO

## 2014-11-13 ENCOUNTER — Ambulatory Visit (INDEPENDENT_AMBULATORY_CARE_PROVIDER_SITE_OTHER): Payer: Federal, State, Local not specified - PPO | Admitting: Internal Medicine

## 2014-11-13 VITALS — BP 140/90 | HR 76 | Wt 179.0 lb

## 2014-11-13 DIAGNOSIS — E032 Hypothyroidism due to medicaments and other exogenous substances: Secondary | ICD-10-CM | POA: Diagnosis not present

## 2014-11-13 DIAGNOSIS — R972 Elevated prostate specific antigen [PSA]: Secondary | ICD-10-CM

## 2014-11-13 DIAGNOSIS — F32A Depression, unspecified: Secondary | ICD-10-CM

## 2014-11-13 DIAGNOSIS — N400 Enlarged prostate without lower urinary tract symptoms: Secondary | ICD-10-CM

## 2014-11-13 DIAGNOSIS — F329 Major depressive disorder, single episode, unspecified: Secondary | ICD-10-CM

## 2014-11-13 LAB — BASIC METABOLIC PANEL
BUN: 15 mg/dL (ref 6–23)
CO2: 25 mEq/L (ref 19–32)
Calcium: 10 mg/dL (ref 8.4–10.5)
Chloride: 104 mEq/L (ref 96–112)
Creatinine, Ser: 0.96 mg/dL (ref 0.40–1.50)
GFR: 85.63 mL/min (ref >60.00)
GLUCOSE: 98 mg/dL (ref 70–99)
Potassium: 4.3 mEq/L (ref 3.5–5.1)
Sodium: 138 mEq/L (ref 135–145)

## 2014-11-13 LAB — CBC WITH DIFFERENTIAL/PLATELET
BASOS PCT: 1.1 % (ref 0.0–3.0)
Basophils Absolute: 0.1 10*3/uL (ref 0.0–0.1)
EOS ABS: 0.2 10*3/uL (ref 0.0–0.7)
Eosinophils Relative: 4.7 % (ref 0.0–5.0)
HEMATOCRIT: 49.5 % (ref 39.0–52.0)
Hemoglobin: 17.4 g/dL — ABNORMAL HIGH (ref 13.0–17.0)
Lymphocytes Relative: 35.3 % (ref 12.0–46.0)
Lymphs Abs: 1.7 10*3/uL (ref 0.7–4.0)
MCHC: 35 g/dL (ref 30.0–36.0)
MCV: 92.1 fl (ref 78.0–100.0)
MONOS PCT: 8.6 % (ref 3.0–12.0)
Monocytes Absolute: 0.4 10*3/uL (ref 0.1–1.0)
NEUTROS PCT: 50.3 % (ref 43.0–77.0)
Neutro Abs: 2.4 10*3/uL (ref 1.4–7.7)
Platelets: 198 10*3/uL (ref 150.0–400.0)
RBC: 5.38 Mil/uL (ref 4.22–5.81)
RDW: 12.4 % (ref 11.5–15.5)
WBC: 4.8 10*3/uL (ref 4.0–10.5)

## 2014-11-13 LAB — TSH: TSH: 3.99 u[IU]/mL (ref 0.35–4.50)

## 2014-11-13 MED ORDER — FLUTICASONE FUROATE 27.5 MCG/SPRAY NA SUSP: 1.0000 | Freq: Every day | NASAL | Status: DC

## 2014-11-13 MED ORDER — MONTELUKAST SODIUM 10 MG PO TABS: 10.0000 mg | ORAL_TABLET | Freq: Every day | ORAL | Status: DC

## 2014-11-13 MED ORDER — LEVOTHYROXINE SODIUM 50 MCG PO TABS: 50.0000 ug | ORAL_TABLET | Freq: Every day | ORAL | Status: DC

## 2014-11-13 NOTE — Assessment & Plan Note (Signed)
Chronic On Levothroid - 

## 2014-11-13 NOTE — Progress Notes (Signed)
   Subjective:     HPI    The patient presents for a follow-up of  chronic hypertension, chronic dyslipidemia, elev PSA, hypothyroidism, controlled with medicines  Wife is sick with post-THR Staph - last surg Mar 08 2012. She got the wound infected again.Marland KitchenMarland Kitchen4/15 worse now  He  retired in Oct 2013 Thinking of moving to [facility] Readings from Last 3 Encounters:  11/13/14 179 lb (81.194 kg)  05/08/14 176 lb (79.833 kg)  01/20/14 174 lb (78.926 kg)   BP Readings from Last 3 Encounters:  11/13/14 140/90  05/08/14 138/90  01/20/14 136/102       Review of Systems  Constitutional: Negative for appetite change, fatigue and unexpected weight change (lost).  HENT: Negative for congestion, nosebleeds, sneezing, sore throat and trouble swallowing.   Eyes: Negative for itching and visual disturbance.  Respiratory: Negative for cough.   Cardiovascular: Negative for chest pain, palpitations and leg swelling.  Gastrointestinal: Negative for nausea, diarrhea, blood in stool and abdominal distention.  Genitourinary: Negative for frequency and hematuria.  Musculoskeletal: Negative for back pain, joint swelling, gait problem and neck pain.  Skin: Negative for rash.  Neurological: Negative for dizziness, tremors, speech difficulty and weakness.  Psychiatric/Behavioral: Negative for sleep disturbance, dysphoric mood and agitation. The patient is not nervous/anxious.    Not suicidal     Objective:   Physical Exam  Constitutional: He is oriented to person, place, and time. He appears well-developed.  HENT:  Mouth/Throat: Oropharynx is clear and moist.  Eyes: Conjunctivae are normal. Pupils are equal, round, and reactive to light.  Neck: Normal range of motion. No JVD present. No thyromegaly present.  Cardiovascular: Normal rate, regular rhythm, normal heart sounds and intact distal pulses.  Exam reveals no gallop and no friction rub.   No murmur heard. Pulmonary/Chest: Effort  normal and breath sounds normal. No respiratory distress. He has no wheezes. He has no rales. He exhibits no tenderness.  Abdominal: Soft. Bowel sounds are normal. He exhibits no distension and no mass. There is no tenderness. There is no rebound and no guarding.  Musculoskeletal: Normal range of motion. He exhibits no edema or tenderness.  Lymphadenopathy:    He has no cervical adenopathy.  Neurological: He is alert and oriented to person, place, and time. He has normal reflexes. No cranial nerve deficit. He exhibits normal muscle tone. Coordination normal.  Skin: Skin is warm and dry. No rash noted.  Psychiatric: Judgment and thought content normal.    less weak voice      Lab Results  Component Value Date   WBC 4.5 05/08/2014   HGB 17.5* 05/08/2014   HCT 50.8 05/08/2014   PLT 196.0 05/08/2014   CHOL 247* 05/08/2014   TRIG 124.0 05/08/2014   HDL 42.30 05/08/2014   LDLDIRECT 116.5 01/02/2011   ALT 14 05/08/2014   AST 25 05/08/2014   NA 140 05/08/2014   K 4.1 05/08/2014   CL 106 05/08/2014   CREATININE 1.0 05/08/2014   BUN 13 05/08/2014   CO2 27 05/08/2014   TSH 3.32 05/08/2014   PSA 5.20* 05/08/2014   HGBA1C 5.1 10/28/2012     Assessment & Plan:

## 2014-11-13 NOTE — Progress Notes (Signed)
Pre visit review using our clinic review tool, if applicable. No additional management support is needed unless otherwise documented below in the visit note. 

## 2014-11-13 NOTE — Assessment & Plan Note (Signed)
H/o bipolar psychotic episode On Clonazepam, Wellbutrin

## 2014-11-13 NOTE — Assessment & Plan Note (Signed)
Avodart Dr Diona Fanti

## 2014-11-13 NOTE — Assessment & Plan Note (Signed)
Dr Diona Fanti, PSA q 6 mo Avodart

## 2015-05-17 ENCOUNTER — Ambulatory Visit: Payer: Federal, State, Local not specified - PPO | Admitting: Internal Medicine

## 2015-06-01 ENCOUNTER — Ambulatory Visit (INDEPENDENT_AMBULATORY_CARE_PROVIDER_SITE_OTHER): Payer: Federal, State, Local not specified - PPO | Admitting: Internal Medicine

## 2015-06-01 ENCOUNTER — Other Ambulatory Visit (INDEPENDENT_AMBULATORY_CARE_PROVIDER_SITE_OTHER): Payer: Federal, State, Local not specified - PPO

## 2015-06-01 ENCOUNTER — Encounter: Payer: Self-pay | Admitting: Internal Medicine

## 2015-06-01 VITALS — BP 138/90 | HR 81 | Wt 178.0 lb

## 2015-06-01 DIAGNOSIS — F329 Major depressive disorder, single episode, unspecified: Secondary | ICD-10-CM | POA: Diagnosis not present

## 2015-06-01 DIAGNOSIS — R498 Other voice and resonance disorders: Secondary | ICD-10-CM | POA: Diagnosis not present

## 2015-06-01 DIAGNOSIS — E032 Hypothyroidism due to medicaments and other exogenous substances: Secondary | ICD-10-CM | POA: Diagnosis not present

## 2015-06-01 DIAGNOSIS — E785 Hyperlipidemia, unspecified: Secondary | ICD-10-CM

## 2015-06-01 DIAGNOSIS — Z23 Encounter for immunization: Secondary | ICD-10-CM | POA: Diagnosis not present

## 2015-06-01 DIAGNOSIS — F32A Depression, unspecified: Secondary | ICD-10-CM

## 2015-06-01 LAB — BASIC METABOLIC PANEL
BUN: 14 mg/dL (ref 6–23)
CALCIUM: 10 mg/dL (ref 8.4–10.5)
CO2: 23 mEq/L (ref 19–32)
Chloride: 104 mEq/L (ref 96–112)
Creatinine, Ser: 0.89 mg/dL (ref 0.40–1.50)
GFR: 93.27 mL/min (ref >60.00)
Glucose, Bld: 106 mg/dL — ABNORMAL HIGH (ref 70–99)
Potassium: 4 mEq/L (ref 3.5–5.1)
Sodium: 139 mEq/L (ref 135–145)

## 2015-06-01 LAB — TSH: TSH: 1.76 u[IU]/mL (ref 0.35–4.50)

## 2015-06-01 MED ORDER — LEVOTHYROXINE SODIUM 50 MCG PO TABS: 50.0000 ug | ORAL_TABLET | Freq: Every day | ORAL | Status: DC

## 2015-06-01 MED ORDER — MONTELUKAST SODIUM 10 MG PO TABS: 10.0000 mg | ORAL_TABLET | Freq: Every day | ORAL | Status: DC

## 2015-06-01 NOTE — Progress Notes (Signed)
Subjective:  Patient ID: Adam Keller, male    DOB: 03-Apr-1956  Age: 59 y.o. MRN: [id number]  CC: No chief complaint on file.   HPI Taydon Klass Rengel presents for hypothyroidism, BPH, depression, GERD f/u  Outpatient Prescriptions Prior to Visit  Medication Sig Dispense Refill  . aspirin 81 MG EC tablet Take 1 tablet (81 mg total) by mouth 2 (two) times daily. 100 tablet 5  . Cholecalciferol 1000 UNITS tablet Take 1,000 Units by mouth daily.      . clonazePAM (KLONOPIN) 2 MG tablet Take 2 mg by mouth at bedtime as needed.      . colesevelam (WELCHOL) 625 MG tablet Take 625-1,250 mg by mouth as needed. For diarrhea     . dutasteride (AVODART) 0.5 MG capsule Take 0.5 mg by mouth every other day.      . fish oil-omega-3 fatty acids 1000 MG capsule Take 1 g by mouth daily.      . fluticasone (VERAMYST) 27.5 MCG/SPRAY nasal spray Place 1 spray into the nose daily. 30 g 3  . lamoTRIgine (LAMICTAL) 200 MG tablet Take 200 mg by mouth at bedtime.      . Multiple Vitamins-Minerals (MULTIVITAMIN,TX-MINERALS) tablet Take 1 tablet by mouth daily.      . Selenium 200 MCG CAPS Take 1 capsule by mouth daily.      . zaleplon (SONATA) 5 MG capsule Take 5 mg by mouth at bedtime.      Marland Kitchen levothyroxine (SYNTHROID) 50 MCG tablet Take 1 tablet (50 mcg total) by mouth daily before breakfast. 90 tablet 3  . montelukast (SINGULAIR) 10 MG tablet Take 1 tablet (10 mg total) by mouth daily. 90 tablet 3  . amantadine (SYMMETREL) 100 MG capsule Take 100 mg by mouth daily.      Marland Kitchen buPROPion (WELLBUTRIN XL) 150 MG 24 hr tablet Take 300 mg by mouth every morning.      . Lutein 20 MG TABS Take 1 tablet by mouth daily.      Marland Kitchen omeprazole (PRILOSEC) 40 MG capsule Take 40 mg by mouth daily as needed.      No facility-administered medications prior to visit.    ROS Review of Systems  Constitutional: Negative for appetite change, fatigue and unexpected weight change.  HENT: Negative for congestion, nosebleeds, sneezing, sore  throat and trouble swallowing.   Eyes: Negative for itching and visual disturbance.  Respiratory: Negative for cough.   Cardiovascular: Negative for chest pain, palpitations and leg swelling.  Gastrointestinal: Negative for nausea, diarrhea, blood in stool and abdominal distention.  Genitourinary: Negative for urgency, frequency and hematuria.  Musculoskeletal: Negative for back pain, joint swelling, gait problem and neck pain.  Skin: Negative for rash.  Neurological: Negative for dizziness, tremors, speech difficulty and weakness.  Psychiatric/Behavioral: Positive for sleep disturbance. Negative for suicidal ideas, dysphoric mood and agitation. The patient is nervous/anxious.     Objective:  BP 138/90 mmHg  Pulse 81  Wt 178 lb (80.74 kg)  SpO2 97%  BP Readings from Last 3 Encounters:  06/01/15 138/90  11/13/14 140/90  05/08/14 138/90    Wt Readings from Last 3 Encounters:  06/01/15 178 lb (80.74 kg)  11/13/14 179 lb (81.194 kg)  05/08/14 176 lb (79.833 kg)    Physical Exam  Constitutional: He is oriented to person, place, and time. He appears well-developed. No distress.  NAD  HENT:  Mouth/Throat: Oropharynx is clear and moist.  Eyes: Conjunctivae are normal. Pupils are equal, round, and reactive  to light.  Neck: Normal range of motion. No JVD present. No thyromegaly present.  Cardiovascular: Normal rate, regular rhythm, normal heart sounds and intact distal pulses.  Exam reveals no gallop and no friction rub.   No murmur heard. Pulmonary/Chest: Effort normal and breath sounds normal. No respiratory distress. He has no wheezes. He has no rales. He exhibits no tenderness.  Abdominal: Soft. Bowel sounds are normal. He exhibits no distension and no mass. There is no tenderness. There is no rebound and no guarding.  Musculoskeletal: Normal range of motion. He exhibits no edema or tenderness.  Lymphadenopathy:    He has no cervical adenopathy.  Neurological: He is alert and  oriented to person, place, and time. He has normal reflexes. No cranial nerve deficit. He exhibits normal muscle tone. He displays a negative Romberg sign. Coordination and gait normal.  Skin: Skin is warm and dry. No rash noted.  Psychiatric: He has a normal mood and affect. His behavior is normal. Judgment and thought content normal.    Lab Results  Component Value Date   WBC 4.8 11/13/2014   HGB 17.4* 11/13/2014   HCT 49.5 11/13/2014   PLT 198.0 11/13/2014   GLUCOSE 106* 06/01/2015   CHOL 247* 05/08/2014   TRIG 124.0 05/08/2014   HDL 42.30 05/08/2014   LDLDIRECT 116.5 01/02/2011   LDLCALC 180* 05/08/2014   ALT 14 05/08/2014   AST 25 05/08/2014   NA 139 06/01/2015   K 4.0 06/01/2015   CL 104 06/01/2015   CREATININE 0.89 06/01/2015   BUN 14 06/01/2015   CO2 23 06/01/2015   TSH 1.76 06/01/2015   PSA 5.20* 05/08/2014   HGBA1C 5.1 10/28/2012    Mr Prostate W / Wo Cm  04/03/2014  CLINICAL DATA:  Elevated PSA level. Prior negative biopsy. History of prostatitis. EXAM: MR PROSTATE WITHOUT AND WITH CONTRAST TECHNIQUE: Multiplanar multisequence MRI images were obtained of the pelvis centered about the prostate. Pre and post contrast images were obtained. CONTRAST:  57mL MULTIHANCE GADOBENATE DIMEGLUMINE 529 MG/ML IV SOLN COMPARISON:  Biopsy of 07/19/2008. FINDINGS: Prostate: Demonstrates moderate central gland enlargement and heterogeneity, most consistent with benign prostatic hyperplasia. No areas of peripheral zone T2 hypointensity to suggest carcinoma. No restricted diffusion or early post-contrast enhancement. Transcapsular spread:  Absent Seminal vesicle involvement: Absent Neurovascular bundle involvement: Absent Pelvic adenopathy: Absent Bone metastasis: Absent Other findings: Normal pelvic bowel loops and urinary bladder, without significant free pelvic fluid. IMPRESSION: Findings of benign prostatic hyperplasia. No evidence of prostate carcinoma. Electronically Signed   By: Abigail Miyamoto M.D.   On: 04/03/2014 16:33    Assessment & Plan:   Diagnoses and all orders for this visit:  Hypothyroidism due to medication -     TSH; Future -     Basic metabolic panel; Future  Depression  Dyslipidemia  HOARSENESS  Need for influenza vaccination -     Flu Vaccine QUAD 36+ mos IM  Other orders -     levothyroxine (SYNTHROID) 50 MCG tablet; Take 1 tablet (50 mcg total) by mouth daily before breakfast. -     montelukast (SINGULAIR) 10 MG tablet; Take 1 tablet (10 mg total) by mouth daily.   I am having Mr. Gattuso maintain his amantadine, dutasteride, clonazePAM, lamoTRIgine, (multivitamin,tx-minerals), Cholecalciferol, colesevelam, buPROPion, zaleplon, Selenium, fish oil-omega-3 fatty acids, Lutein, omeprazole, aspirin, fluticasone, levothyroxine, and montelukast.  Meds ordered this encounter  Medications  . levothyroxine (SYNTHROID) 50 MCG tablet    Sig: Take 1 tablet (50 mcg total)  by mouth daily before breakfast.    Dispense:  90 tablet    Refill:  3  . montelukast (SINGULAIR) 10 MG tablet    Sig: Take 1 tablet (10 mg total) by mouth daily.    Dispense:  90 tablet    Refill:  3     Follow-up: Return in about 6 months (around 11/29/2015) for Wellness Exam.  Walker Kehr, MD

## 2015-06-01 NOTE — Assessment & Plan Note (Signed)
S/p VC procedure

## 2015-06-01 NOTE — Assessment & Plan Note (Signed)
On Clonazepam, Wellbutrin

## 2015-06-01 NOTE — Assessment & Plan Note (Signed)
On Levothroid 

## 2015-06-01 NOTE — Assessment & Plan Note (Signed)
  On diet  

## 2015-06-01 NOTE — Progress Notes (Signed)
Pre visit review using our clinic review tool, if applicable. No additional management support is needed unless otherwise documented below in the visit note. 

## 2015-06-03 ENCOUNTER — Encounter: Payer: Self-pay | Admitting: Internal Medicine

## 2015-11-13 DIAGNOSIS — K080 Exfoliation of teeth due to systemic causes: Secondary | ICD-10-CM | POA: Diagnosis not present

## 2015-11-29 ENCOUNTER — Ambulatory Visit (INDEPENDENT_AMBULATORY_CARE_PROVIDER_SITE_OTHER): Payer: Federal, State, Local not specified - PPO | Admitting: Internal Medicine

## 2015-11-29 ENCOUNTER — Encounter: Payer: Self-pay | Admitting: Internal Medicine

## 2015-11-29 ENCOUNTER — Other Ambulatory Visit (INDEPENDENT_AMBULATORY_CARE_PROVIDER_SITE_OTHER): Payer: Federal, State, Local not specified - PPO

## 2015-11-29 VITALS — BP 140/90 | HR 80 | Ht 72.0 in | Wt 173.0 lb

## 2015-11-29 DIAGNOSIS — F329 Major depressive disorder, single episode, unspecified: Secondary | ICD-10-CM | POA: Diagnosis not present

## 2015-11-29 DIAGNOSIS — Z0000 Encounter for general adult medical examination without abnormal findings: Secondary | ICD-10-CM

## 2015-11-29 DIAGNOSIS — R739 Hyperglycemia, unspecified: Secondary | ICD-10-CM | POA: Diagnosis not present

## 2015-11-29 DIAGNOSIS — E032 Hypothyroidism due to medicaments and other exogenous substances: Secondary | ICD-10-CM | POA: Diagnosis not present

## 2015-11-29 DIAGNOSIS — F32A Depression, unspecified: Secondary | ICD-10-CM

## 2015-11-29 LAB — CBC WITH DIFFERENTIAL/PLATELET
Basophils Absolute: 0 10*3/uL (ref 0.0–0.1)
Basophils Relative: 0.8 % (ref 0.0–3.0)
EOS PCT: 4.2 % (ref 0.0–5.0)
Eosinophils Absolute: 0.2 10*3/uL (ref 0.0–0.7)
HCT: 48 % (ref 39.0–52.0)
Hemoglobin: 16.7 g/dL (ref 13.0–17.0)
Lymphocytes Relative: 29.8 % (ref 12.0–46.0)
Lymphs Abs: 1.1 10*3/uL (ref 0.7–4.0)
MCHC: 34.9 g/dL (ref 30.0–36.0)
MCV: 92.8 fl (ref 78.0–100.0)
MONOS PCT: 9.8 % (ref 3.0–12.0)
Monocytes Absolute: 0.4 10*3/uL (ref 0.1–1.0)
Neutro Abs: 2.1 10*3/uL (ref 1.4–7.7)
Neutrophils Relative %: 55.4 % (ref 43.0–77.0)
Platelets: 204 10*3/uL (ref 150.0–400.0)
RBC: 5.17 Mil/uL (ref 4.22–5.81)
RDW: 12.7 % (ref 11.5–15.5)
WBC: 3.9 10*3/uL — ABNORMAL LOW (ref 4.0–10.5)

## 2015-11-29 LAB — HEPATIC FUNCTION PANEL
ALK PHOS: 66 U/L (ref 39–117)
ALT: 15 U/L (ref 0–53)
AST: 33 U/L (ref 0–37)
Albumin: 5 g/dL (ref 3.5–5.2)
Bilirubin, Direct: 0.2 mg/dL (ref 0.0–0.3)
TOTAL PROTEIN: 7.1 g/dL (ref 6.0–8.3)
Total Bilirubin: 1.2 mg/dL (ref 0.2–1.2)

## 2015-11-29 LAB — LIPID PANEL
CHOL/HDL RATIO: 5
CHOLESTEROL: 222 mg/dL — AB (ref 0–200)
HDL: 46.5 mg/dL (ref >39.00)
LDL CALC: 162 mg/dL — AB (ref 0–99)
NonHDL: 175.04
Triglycerides: 67 mg/dL (ref 0.0–149.0)
VLDL: 13.4 mg/dL (ref 0.0–40.0)

## 2015-11-29 LAB — URINALYSIS
BILIRUBIN URINE: NEGATIVE
HGB URINE DIPSTICK: NEGATIVE
Leukocytes, UA: NEGATIVE
Nitrite: NEGATIVE
Specific Gravity, Urine: 1.015 (ref 1.000–1.030)
TOTAL PROTEIN, URINE-UPE24: NEGATIVE
URINE GLUCOSE: NEGATIVE
Urobilinogen, UA: 0.2 (ref 0.0–1.0)
pH: 7 (ref 5.0–8.0)

## 2015-11-29 LAB — TSH: TSH: 2.43 u[IU]/mL (ref 0.35–4.50)

## 2015-11-29 LAB — BASIC METABOLIC PANEL
BUN: 15 mg/dL (ref 6–23)
CO2: 28 mEq/L (ref 19–32)
Calcium: 9.8 mg/dL (ref 8.4–10.5)
Chloride: 104 mEq/L (ref 96–112)
Creatinine, Ser: 0.9 mg/dL (ref 0.40–1.50)
GFR: 91.91 mL/min (ref >60.00)
GLUCOSE: 107 mg/dL — AB (ref 70–99)
POTASSIUM: 4.6 meq/L (ref 3.5–5.1)
SODIUM: 138 meq/L (ref 135–145)

## 2015-11-29 LAB — HEMOGLOBIN A1C: Hgb A1c MFr Bld: 5.2 % (ref 4.6–6.5)

## 2015-11-29 MED ORDER — FLUTICASONE FUROATE 27.5 MCG/SPRAY NA SUSP: 1.0000 | Freq: Every day | NASAL | Status: DC

## 2015-11-29 NOTE — Assessment & Plan Note (Signed)
We discussed age appropriate health related issues, including available/recomended screening tests and vaccinations. We discussed a need for adhering to healthy diet and exercise. Labs/EKG were reviewed/ordered. All questions were answered.  Colon 2015 Rectal Dr Diona Fanti - had an MRI Labs

## 2015-11-29 NOTE — Assessment & Plan Note (Signed)
On Clonazepam, off Wellbutrin [name] is very sick

## 2015-11-29 NOTE — Progress Notes (Signed)
Subjective:  Patient ID: Adam Keller, male    DOB: [birth year]  Age: 60 y.o. MRN: [id number]  CC: Annual Exam   HPI Adam Keller presents for a well exam  Outpatient Prescriptions Prior to Visit  Medication Sig Dispense Refill  . amantadine (SYMMETREL) 100 MG capsule Take 100 mg by mouth daily.      Marland Kitchen aspirin 81 MG EC tablet Take 1 tablet (81 mg total) by mouth 2 (two) times daily. 100 tablet 5  . Cholecalciferol 1000 UNITS tablet Take 1,000 Units by mouth daily.      . clonazePAM (KLONOPIN) 2 MG tablet Take 2 mg by mouth at bedtime as needed.      . colesevelam (WELCHOL) 625 MG tablet Take 625-1,250 mg by mouth as needed. For diarrhea     . dutasteride (AVODART) 0.5 MG capsule Take 0.5 mg by mouth every other day.      . fish oil-omega-3 fatty acids 1000 MG capsule Take 1 g by mouth daily.      . fluticasone (VERAMYST) 27.5 MCG/SPRAY nasal spray Place 1 spray into the nose daily. 30 g 3  . lamoTRIgine (LAMICTAL) 200 MG tablet Take 200 mg by mouth at bedtime.      Marland Kitchen levothyroxine (SYNTHROID) 50 MCG tablet Take 1 tablet (50 mcg total) by mouth daily before breakfast. 90 tablet 3  . montelukast (SINGULAIR) 10 MG tablet Take 1 tablet (10 mg total) by mouth daily. 90 tablet 3  . Multiple Vitamins-Minerals (MULTIVITAMIN,TX-MINERALS) tablet Take 1 tablet by mouth daily.      Marland Kitchen omeprazole (PRILOSEC) 40 MG capsule Take 40 mg by mouth daily as needed.     . zaleplon (SONATA) 5 MG capsule Take 5 mg by mouth at bedtime.      Marland Kitchen buPROPion (WELLBUTRIN XL) 150 MG 24 hr tablet Take 300 mg by mouth every morning. Reported on 11/29/2015    . Lutein 20 MG TABS Take 1 tablet by mouth daily. Reported on 11/29/2015    . Selenium 200 MCG CAPS Take 1 capsule by mouth daily. Reported on 11/29/2015     No facility-administered medications prior to visit.    ROS Review of Systems  Constitutional: Negative for appetite change, fatigue and unexpected weight change.  HENT: Negative for congestion, nosebleeds,  sneezing, sore throat and trouble swallowing.   Eyes: Negative for itching and visual disturbance.  Respiratory: Negative for cough.   Cardiovascular: Negative for chest pain, palpitations and leg swelling.  Gastrointestinal: Negative for nausea, diarrhea, blood in stool and abdominal distention.  Genitourinary: Negative for frequency and hematuria.  Musculoskeletal: Negative for back pain, joint swelling, gait problem and neck pain.  Skin: Negative for rash.  Neurological: Negative for dizziness, tremors, speech difficulty and weakness.  Psychiatric/Behavioral: Negative for suicidal ideas, sleep disturbance, dysphoric mood and agitation. The patient is not nervous/anxious.     Objective:  BP 140/90 mmHg  Pulse 80  Ht 6' (1.829 m)  Wt 173 lb (78.472 kg)  BMI 23.46 kg/m2  SpO2 97%  BP Readings from Last 3 Encounters:  11/29/15 140/90  06/01/15 138/90  11/13/14 140/90    Wt Readings from Last 3 Encounters:  11/29/15 173 lb (78.472 kg)  06/01/15 178 lb (80.74 kg)  11/13/14 179 lb (81.194 kg)    Physical Exam  Constitutional: He is oriented to person, place, and time. He appears well-developed. No distress.  NAD  HENT:  Mouth/Throat: Oropharynx is clear and moist.  Eyes: Conjunctivae are normal. Pupils  are equal, round, and reactive to light.  Neck: Normal range of motion. No JVD present. No thyromegaly present.  Cardiovascular: Normal rate, regular rhythm, normal heart sounds and intact distal pulses.  Exam reveals no gallop and no friction rub.   No murmur heard. Pulmonary/Chest: Effort normal and breath sounds normal. No respiratory distress. He has no wheezes. He has no rales. He exhibits no tenderness.  Abdominal: Soft. Bowel sounds are normal. He exhibits no distension and no mass. There is no tenderness. There is no rebound and no guarding.  Musculoskeletal: Normal range of motion. He exhibits no edema or tenderness.  Lymphadenopathy:    He has no cervical adenopathy.    Neurological: He is alert and oriented to person, place, and time. He has normal reflexes. No cranial nerve deficit. He exhibits normal muscle tone. He displays a negative Romberg sign. Coordination and gait normal.  Skin: Skin is warm and dry. No rash noted.  Psychiatric: He has a normal mood and affect. His behavior is normal. Judgment and thought content normal.  SD on face Rectal: per Dr Diona Fanti  Lab Results  Component Value Date   WBC 4.8 11/13/2014   HGB 17.4* 11/13/2014   HCT 49.5 11/13/2014   PLT 198.0 11/13/2014   GLUCOSE 106* 06/01/2015   CHOL 247* 05/08/2014   TRIG 124.0 05/08/2014   HDL 42.30 05/08/2014   LDLDIRECT 116.5 01/02/2011   LDLCALC 180* 05/08/2014   ALT 14 05/08/2014   AST 25 05/08/2014   NA 139 06/01/2015   K 4.0 06/01/2015   CL 104 06/01/2015   CREATININE 0.89 06/01/2015   BUN 14 06/01/2015   CO2 23 06/01/2015   TSH 1.76 06/01/2015   PSA 5.20* 05/08/2014   HGBA1C 5.1 10/28/2012    Mr Prostate W / Wo Cm  04/03/2014  CLINICAL DATA:  Elevated PSA level. Prior negative biopsy. History of prostatitis. EXAM: MR PROSTATE WITHOUT AND WITH CONTRAST TECHNIQUE: Multiplanar multisequence MRI images were obtained of the pelvis centered about the prostate. Pre and post contrast images were obtained. CONTRAST:  23mL MULTIHANCE GADOBENATE DIMEGLUMINE 529 MG/ML IV SOLN COMPARISON:  Biopsy of 07/19/2008. FINDINGS: Prostate: Demonstrates moderate central gland enlargement and heterogeneity, most consistent with benign prostatic hyperplasia. No areas of peripheral zone T2 hypointensity to suggest carcinoma. No restricted diffusion or early post-contrast enhancement. Transcapsular spread:  Absent Seminal vesicle involvement: Absent Neurovascular bundle involvement: Absent Pelvic adenopathy: Absent Bone metastasis: Absent Other findings: Normal pelvic bowel loops and urinary bladder, without significant free pelvic fluid. IMPRESSION: Findings of benign prostatic hyperplasia. No  evidence of prostate carcinoma. Electronically Signed   By: Abigail Miyamoto M.D.   On: 04/03/2014 16:33    Assessment & Plan:   There are no diagnoses linked to this encounter. I am having Mr. Curren maintain his amantadine, dutasteride, clonazePAM, lamoTRIgine, (multivitamin,tx-minerals), Cholecalciferol, colesevelam, buPROPion, zaleplon, Selenium, fish oil-omega-3 fatty acids, Lutein, omeprazole, aspirin, fluticasone, levothyroxine, and montelukast.  No orders of the defined types were placed in this encounter.     Follow-up: No Follow-up on file.  Walker Kehr, MD

## 2015-11-29 NOTE — Assessment & Plan Note (Signed)
Chronic On Levothroid - 

## 2015-11-29 NOTE — Progress Notes (Signed)
Pre visit review using our clinic review tool, if applicable. No additional management support is needed unless otherwise documented below in the visit note. 

## 2015-11-29 NOTE — Assessment & Plan Note (Signed)
Labs

## 2015-11-30 LAB — HEPATITIS C ANTIBODY: HCV Ab: NEGATIVE

## 2015-12-04 ENCOUNTER — Telehealth: Payer: Self-pay

## 2015-12-04 MED ORDER — MOMETASONE FUROATE 50 MCG/ACT NA SUSP: 2.0000 | Freq: Every day | NASAL | Status: DC

## 2015-12-04 NOTE — Telephone Encounter (Signed)
Ok Nasonex Thx

## 2015-12-04 NOTE — Telephone Encounter (Signed)
fluticasone (VERAMYST) 27.5 MCG/SPRAY nasal spray [id number]      [facility] mail order called and said.Marland Kitchen ^^This medication is no longer being made.Marland Kitchen Could you please send something else for him to use.  Ref #: [id number]

## 2015-12-04 NOTE — Telephone Encounter (Signed)
Pts wife informed.

## 2015-12-14 DIAGNOSIS — F3174 Bipolar disorder, in full remission, most recent episode manic: Secondary | ICD-10-CM | POA: Diagnosis not present

## 2016-03-14 DIAGNOSIS — R972 Elevated prostate specific antigen [PSA]: Secondary | ICD-10-CM | POA: Diagnosis not present

## 2016-03-21 DIAGNOSIS — N400 Enlarged prostate without lower urinary tract symptoms: Secondary | ICD-10-CM | POA: Diagnosis not present

## 2016-03-21 DIAGNOSIS — R972 Elevated prostate specific antigen [PSA]: Secondary | ICD-10-CM | POA: Diagnosis not present

## 2016-04-01 ENCOUNTER — Encounter: Payer: Self-pay | Admitting: Internal Medicine

## 2016-04-01 MED ORDER — MONTELUKAST SODIUM 10 MG PO TABS: 10.0000 mg | ORAL_TABLET | Freq: Every day | ORAL | 0 refills | Status: DC

## 2016-05-30 ENCOUNTER — Encounter: Payer: Self-pay | Admitting: Internal Medicine

## 2016-05-30 ENCOUNTER — Ambulatory Visit (INDEPENDENT_AMBULATORY_CARE_PROVIDER_SITE_OTHER): Payer: Federal, State, Local not specified - PPO | Admitting: Internal Medicine

## 2016-05-30 ENCOUNTER — Other Ambulatory Visit (INDEPENDENT_AMBULATORY_CARE_PROVIDER_SITE_OTHER): Payer: Federal, State, Local not specified - PPO

## 2016-05-30 DIAGNOSIS — E032 Hypothyroidism due to medicaments and other exogenous substances: Secondary | ICD-10-CM | POA: Diagnosis not present

## 2016-05-30 DIAGNOSIS — F329 Major depressive disorder, single episode, unspecified: Secondary | ICD-10-CM | POA: Diagnosis not present

## 2016-05-30 DIAGNOSIS — Z23 Encounter for immunization: Secondary | ICD-10-CM

## 2016-05-30 DIAGNOSIS — N400 Enlarged prostate without lower urinary tract symptoms: Secondary | ICD-10-CM

## 2016-05-30 DIAGNOSIS — R634 Abnormal weight loss: Secondary | ICD-10-CM

## 2016-05-30 LAB — BASIC METABOLIC PANEL
BUN: 13 mg/dL (ref 6–23)
CHLORIDE: 106 meq/L (ref 96–112)
CO2: 27 meq/L (ref 19–32)
CREATININE: 0.89 mg/dL (ref 0.40–1.50)
Calcium: 10 mg/dL (ref 8.4–10.5)
GFR: 92.95 mL/min (ref >60.00)
Glucose, Bld: 99 mg/dL (ref 70–99)
POTASSIUM: 4.1 meq/L (ref 3.5–5.1)
Sodium: 141 mEq/L (ref 135–145)

## 2016-05-30 LAB — TSH: TSH: 1.08 u[IU]/mL (ref 0.35–4.50)

## 2016-05-30 NOTE — Addendum Note (Signed)
Addended by: Cresenciano Lick on: 05/30/2016 08:38 AM   Modules accepted: Orders

## 2016-05-30 NOTE — Assessment & Plan Note (Signed)
Stress discussed On Clonazepam, off Wellbutrin [name] is very sick

## 2016-05-30 NOTE — Assessment & Plan Note (Signed)
On Levothroid 

## 2016-05-30 NOTE — Assessment & Plan Note (Signed)
?  stress related Discussed stress Labs

## 2016-05-30 NOTE — Progress Notes (Signed)
Pre visit review using our clinic review tool, if applicable. No additional management support is needed unless otherwise documented below in the visit note. 

## 2016-05-30 NOTE — Assessment & Plan Note (Signed)
Avodart 

## 2016-05-30 NOTE — Progress Notes (Signed)
Subjective:  Patient ID: Adam Keller, male    DOB: 1955/08/23  Age: 60 y.o. MRN: [id number]  CC: No chief complaint on file.   HPI Tyre Coady Arciga presents for hypothyroidism, depression, BPH f/u. C/o wt loss - wife is very ill. She is in rehab now.  Outpatient Medications Prior to Visit  Medication Sig Dispense Refill  . amantadine (SYMMETREL) 100 MG capsule Take 100 mg by mouth daily.      Marland Kitchen aspirin 81 MG EC tablet Take 1 tablet (81 mg total) by mouth 2 (two) times daily. 100 tablet 5  . buPROPion (WELLBUTRIN XL) 150 MG 24 hr tablet Take 300 mg by mouth every morning. Reported on 11/29/2015    . Cholecalciferol 1000 UNITS tablet Take 1,000 Units by mouth daily.      . clonazePAM (KLONOPIN) 2 MG tablet Take 2 mg by mouth at bedtime as needed.      . colesevelam (WELCHOL) 625 MG tablet Take 625-1,250 mg by mouth as needed. For diarrhea     . dutasteride (AVODART) 0.5 MG capsule Take 0.5 mg by mouth every other day.      . fish oil-omega-3 fatty acids 1000 MG capsule Take 1 g by mouth daily.      Marland Kitchen lamoTRIgine (LAMICTAL) 200 MG tablet Take 200 mg by mouth at bedtime.      Marland Kitchen levothyroxine (SYNTHROID) 50 MCG tablet Take 1 tablet (50 mcg total) by mouth daily before breakfast. 90 tablet 3  . Lutein 20 MG TABS Take 1 tablet by mouth daily. Reported on 11/29/2015    . mometasone (NASONEX) 50 MCG/ACT nasal spray Place 2 sprays into the nose daily. 17 g 11  . montelukast (SINGULAIR) 10 MG tablet Take 1 tablet (10 mg total) by mouth daily. Keep Nov appt for future refills 90 tablet 0  . Multiple Vitamins-Minerals (MULTIVITAMIN,TX-MINERALS) tablet Take 1 tablet by mouth daily.      Marland Kitchen omeprazole (PRILOSEC) 40 MG capsule Take 40 mg by mouth daily as needed.     . Selenium 200 MCG CAPS Take 1 capsule by mouth daily. Reported on 11/29/2015    . zaleplon (SONATA) 5 MG capsule Take 5 mg by mouth at bedtime.       No facility-administered medications prior to visit.     ROS Review of Systems    Constitutional: Positive for unexpected weight change. Negative for appetite change and fatigue.  HENT: Negative for congestion, nosebleeds, sneezing, sore throat and trouble swallowing.   Eyes: Negative for itching and visual disturbance.  Respiratory: Negative for cough.   Cardiovascular: Negative for chest pain, palpitations and leg swelling.  Gastrointestinal: Negative for abdominal distention, blood in stool, diarrhea and nausea.  Genitourinary: Negative for frequency and hematuria.  Musculoskeletal: Negative for back pain, gait problem, joint swelling and neck pain.  Skin: Negative for rash.  Neurological: Negative for dizziness, tremors, speech difficulty and weakness.  Psychiatric/Behavioral: Negative for agitation, dysphoric mood and sleep disturbance. The patient is not nervous/anxious.     Objective:  BP 118/70   Pulse 70   Temp 97.9 F (36.6 C)   Wt 161 lb (73 kg)   SpO2 99%   BMI 21.84 kg/m   BP Readings from Last 3 Encounters:  05/30/16 118/70  11/29/15 140/90  06/01/15 138/90    Wt Readings from Last 3 Encounters:  05/30/16 161 lb (73 kg)  11/29/15 173 lb (78.5 kg)  06/01/15 178 lb (80.7 kg)    Physical Exam  Constitutional: He is oriented to person, place, and time. He appears well-developed. No distress.  NAD  HENT:  Mouth/Throat: Oropharynx is clear and moist.  Eyes: Conjunctivae are normal. Pupils are equal, round, and reactive to light.  Neck: Normal range of motion. No JVD present. No thyromegaly present.  Cardiovascular: Normal rate, regular rhythm, normal heart sounds and intact distal pulses.  Exam reveals no gallop and no friction rub.   No murmur heard. Pulmonary/Chest: Effort normal and breath sounds normal. No respiratory distress. He has no wheezes. He has no rales. He exhibits no tenderness.  Abdominal: Soft. Bowel sounds are normal. He exhibits no distension and no mass. There is no tenderness. There is no rebound and no guarding.   Musculoskeletal: Normal range of motion. He exhibits no edema or tenderness.  Lymphadenopathy:    He has no cervical adenopathy.  Neurological: He is alert and oriented to person, place, and time. He has normal reflexes. No cranial nerve deficit. He exhibits normal muscle tone. He displays a negative Romberg sign. Coordination and gait normal.  Skin: Skin is warm and dry. No rash noted.  Psychiatric: His behavior is normal. Judgment and thought content normal.  Thin Sad  Lab Results  Component Value Date   WBC 3.9 (L) 11/29/2015   HGB 16.7 11/29/2015   HCT 48.0 11/29/2015   PLT 204.0 11/29/2015   GLUCOSE 107 (H) 11/29/2015   CHOL 222 (H) 11/29/2015   TRIG 67.0 11/29/2015   HDL 46.50 11/29/2015   LDLDIRECT 116.5 01/02/2011   LDLCALC 162 (H) 11/29/2015   ALT 15 11/29/2015   AST 33 11/29/2015   NA 138 11/29/2015   K 4.6 11/29/2015   CL 104 11/29/2015   CREATININE 0.90 11/29/2015   BUN 15 11/29/2015   CO2 28 11/29/2015   TSH 2.43 11/29/2015   PSA 5.20 (H) 05/08/2014   HGBA1C 5.2 11/29/2015    Mr Prostate W / Wo Cm  Result Date: 04/03/2014 CLINICAL DATA:  Elevated PSA level. Prior negative biopsy. History of prostatitis. EXAM: MR PROSTATE WITHOUT AND WITH CONTRAST TECHNIQUE: Multiplanar multisequence MRI images were obtained of the pelvis centered about the prostate. Pre and post contrast images were obtained. CONTRAST:  25mL MULTIHANCE GADOBENATE DIMEGLUMINE 529 MG/ML IV SOLN COMPARISON:  Biopsy of 07/19/2008. FINDINGS: Prostate: Demonstrates moderate central gland enlargement and heterogeneity, most consistent with benign prostatic hyperplasia. No areas of peripheral zone T2 hypointensity to suggest carcinoma. No restricted diffusion or early post-contrast enhancement. Transcapsular spread:  Absent Seminal vesicle involvement: Absent Neurovascular bundle involvement: Absent Pelvic adenopathy: Absent Bone metastasis: Absent Other findings: Normal pelvic bowel loops and urinary  bladder, without significant free pelvic fluid. IMPRESSION: Findings of benign prostatic hyperplasia. No evidence of prostate carcinoma. Electronically Signed   By: Abigail Miyamoto M.D.   On: 04/03/2014 16:33    Assessment & Plan:   There are no diagnoses linked to this encounter. I am having Mr. Bracey maintain his amantadine, dutasteride, clonazePAM, lamoTRIgine, (multivitamin,tx-minerals), Cholecalciferol, colesevelam, buPROPion, zaleplon, Selenium, fish oil-omega-3 fatty acids, Lutein, omeprazole, aspirin, levothyroxine, mometasone, and montelukast.  No orders of the defined types were placed in this encounter.    Follow-up: No Follow-up on file.  Walker Kehr, MD

## 2016-06-12 DIAGNOSIS — J312 Chronic pharyngitis: Secondary | ICD-10-CM | POA: Diagnosis not present

## 2016-06-12 DIAGNOSIS — J301 Allergic rhinitis due to pollen: Secondary | ICD-10-CM | POA: Diagnosis not present

## 2016-06-13 DIAGNOSIS — F3174 Bipolar disorder, in full remission, most recent episode manic: Secondary | ICD-10-CM | POA: Diagnosis not present

## 2016-07-21 DIAGNOSIS — K080 Exfoliation of teeth due to systemic causes: Secondary | ICD-10-CM | POA: Diagnosis not present

## 2016-07-31 ENCOUNTER — Encounter: Payer: Self-pay | Admitting: Internal Medicine

## 2016-07-31 MED ORDER — LEVOTHYROXINE SODIUM 50 MCG PO TABS: 50.0000 ug | ORAL_TABLET | Freq: Every day | ORAL | 3 refills | Status: DC

## 2016-08-15 DIAGNOSIS — K080 Exfoliation of teeth due to systemic causes: Secondary | ICD-10-CM | POA: Diagnosis not present

## 2016-09-04 DIAGNOSIS — J3489 Other specified disorders of nose and nasal sinuses: Secondary | ICD-10-CM | POA: Diagnosis not present

## 2016-09-04 DIAGNOSIS — K148 Other diseases of tongue: Secondary | ICD-10-CM | POA: Diagnosis not present

## 2016-09-12 DIAGNOSIS — K149 Disease of tongue, unspecified: Secondary | ICD-10-CM | POA: Diagnosis not present

## 2016-09-26 ENCOUNTER — Other Ambulatory Visit: Payer: Self-pay | Admitting: Internal Medicine

## 2016-09-26 MED ORDER — MONTELUKAST SODIUM 10 MG PO TABS: 10.0000 mg | ORAL_TABLET | Freq: Every day | ORAL | 0 refills | Status: DC

## 2016-10-09 DIAGNOSIS — N400 Enlarged prostate without lower urinary tract symptoms: Secondary | ICD-10-CM | POA: Diagnosis not present

## 2016-12-01 ENCOUNTER — Ambulatory Visit (INDEPENDENT_AMBULATORY_CARE_PROVIDER_SITE_OTHER): Payer: Federal, State, Local not specified - PPO | Admitting: Internal Medicine

## 2016-12-01 ENCOUNTER — Encounter: Payer: Self-pay | Admitting: Internal Medicine

## 2016-12-01 ENCOUNTER — Other Ambulatory Visit (INDEPENDENT_AMBULATORY_CARE_PROVIDER_SITE_OTHER): Payer: Federal, State, Local not specified - PPO

## 2016-12-01 DIAGNOSIS — F329 Major depressive disorder, single episode, unspecified: Secondary | ICD-10-CM | POA: Diagnosis not present

## 2016-12-01 DIAGNOSIS — J301 Allergic rhinitis due to pollen: Secondary | ICD-10-CM

## 2016-12-01 DIAGNOSIS — F4321 Adjustment disorder with depressed mood: Secondary | ICD-10-CM

## 2016-12-01 DIAGNOSIS — Z0000 Encounter for general adult medical examination without abnormal findings: Secondary | ICD-10-CM | POA: Diagnosis not present

## 2016-12-01 DIAGNOSIS — F4320 Adjustment disorder, unspecified: Secondary | ICD-10-CM | POA: Diagnosis not present

## 2016-12-01 DIAGNOSIS — R972 Elevated prostate specific antigen [PSA]: Secondary | ICD-10-CM | POA: Diagnosis not present

## 2016-12-01 DIAGNOSIS — E039 Hypothyroidism, unspecified: Secondary | ICD-10-CM

## 2016-12-01 DIAGNOSIS — K148 Other diseases of tongue: Secondary | ICD-10-CM

## 2016-12-01 LAB — CBC WITH DIFFERENTIAL/PLATELET
BASOS PCT: 2.2 % (ref 0.0–3.0)
Basophils Absolute: 0.1 10*3/uL (ref 0.0–0.1)
EOS PCT: 5.1 % — AB (ref 0.0–5.0)
Eosinophils Absolute: 0.2 10*3/uL (ref 0.0–0.7)
HCT: 45.5 % (ref 39.0–52.0)
HEMOGLOBIN: 16 g/dL (ref 13.0–17.0)
Lymphocytes Relative: 30.5 % (ref 12.0–46.0)
Lymphs Abs: 1.2 10*3/uL (ref 0.7–4.0)
MCHC: 35.2 g/dL (ref 30.0–36.0)
MCV: 94.5 fl (ref 78.0–100.0)
MONO ABS: 0.3 10*3/uL (ref 0.1–1.0)
MONOS PCT: 8.5 % (ref 3.0–12.0)
Neutro Abs: 2.1 10*3/uL (ref 1.4–7.7)
Neutrophils Relative %: 53.7 % (ref 43.0–77.0)
Platelets: 196 10*3/uL (ref 150.0–400.0)
RBC: 4.81 Mil/uL (ref 4.22–5.81)
RDW: 12.2 % (ref 11.5–15.5)
WBC: 3.9 10*3/uL — AB (ref 4.0–10.5)

## 2016-12-01 LAB — URINALYSIS
BILIRUBIN URINE: NEGATIVE
HGB URINE DIPSTICK: NEGATIVE
Ketones, ur: NEGATIVE
LEUKOCYTES UA: NEGATIVE
NITRITE: NEGATIVE
Specific Gravity, Urine: <=1.005 — AB (ref 1.000–1.030)
Total Protein, Urine: NEGATIVE
UROBILINOGEN UA: 0.2 (ref 0.0–1.0)
Urine Glucose: NEGATIVE
pH: 6 (ref 5.0–8.0)

## 2016-12-01 LAB — LIPID PANEL
Cholesterol: 170 mg/dL (ref 0–200)
HDL: 58.7 mg/dL (ref >39.00)
LDL Cholesterol: 102 mg/dL — ABNORMAL HIGH (ref 0–99)
NonHDL: 111.15
TRIGLYCERIDES: 47 mg/dL (ref 0.0–149.0)
Total CHOL/HDL Ratio: 3
VLDL: 9.4 mg/dL (ref 0.0–40.0)

## 2016-12-01 LAB — BASIC METABOLIC PANEL
BUN: 16 mg/dL (ref 6–23)
CALCIUM: 9.9 mg/dL (ref 8.4–10.5)
CHLORIDE: 105 meq/L (ref 96–112)
CO2: 29 meq/L (ref 19–32)
Creatinine, Ser: 0.94 mg/dL (ref 0.40–1.50)
GFR: 87.11 mL/min (ref >60.00)
GLUCOSE: 109 mg/dL — AB (ref 70–99)
POTASSIUM: 4.3 meq/L (ref 3.5–5.1)
SODIUM: 139 meq/L (ref 135–145)

## 2016-12-01 LAB — HEPATIC FUNCTION PANEL
ALBUMIN: 4.8 g/dL (ref 3.5–5.2)
ALT: 11 U/L (ref 0–53)
AST: 24 U/L (ref 0–37)
Alkaline Phosphatase: 68 U/L (ref 39–117)
BILIRUBIN TOTAL: 0.6 mg/dL (ref 0.2–1.2)
Bilirubin, Direct: 0.2 mg/dL (ref 0.0–0.3)
Total Protein: 6.7 g/dL (ref 6.0–8.3)

## 2016-12-01 LAB — TSH: TSH: 1.4 u[IU]/mL (ref 0.35–4.50)

## 2016-12-01 LAB — VITAMIN D 25 HYDROXY (VIT D DEFICIENCY, FRACTURES): VITD: 85.2 ng/mL (ref 30.00–100.00)

## 2016-12-01 MED ORDER — MONTELUKAST SODIUM 10 MG PO TABS: 10.0000 mg | ORAL_TABLET | Freq: Every day | ORAL | 3 refills | Status: DC

## 2016-12-01 MED ORDER — MOMETASONE FUROATE 50 MCG/ACT NA SUSP: 2.0000 | Freq: Every day | NASAL | 3 refills | Status: DC

## 2016-12-01 NOTE — Assessment & Plan Note (Signed)
Adam Keller died on 11/14/16 - discussed

## 2016-12-01 NOTE — Assessment & Plan Note (Signed)
On Levothroid 

## 2016-12-01 NOTE — Assessment & Plan Note (Signed)
F/u w/Dr Dahlstedt 

## 2016-12-01 NOTE — Assessment & Plan Note (Signed)
We discussed age appropriate health related issues, including available/recomended screening tests and vaccinations. We discussed a need for adhering to healthy diet and exercise. Labs were ordered to be later reviewed . All questions were answered.   

## 2016-12-01 NOTE — Progress Notes (Signed)
Subjective:  Patient ID: Adam Keller, male    DOB: August 01, 1955  Age: 61 y.o. MRN: [id number]  CC: No chief complaint on file.   HPI Kimani Hovis Siegert presents for depression, hypothyroidism, GERD f/u Wife just died He had a spot on the tongue - ?pre-cancerous - seeing an oncologist  Outpatient Medications Prior to Visit  Medication Sig Dispense Refill  . amantadine (SYMMETREL) 100 MG capsule Take 100 mg by mouth daily.      Marland Kitchen aspirin 81 MG EC tablet Take 1 tablet (81 mg total) by mouth 2 (two) times daily. 100 tablet 5  . buPROPion (WELLBUTRIN XL) 150 MG 24 hr tablet Take 300 mg by mouth every morning. Reported on 11/29/2015    . Cholecalciferol 1000 UNITS tablet Take 1,000 Units by mouth daily.      . clonazePAM (KLONOPIN) 2 MG tablet Take 1 mg by mouth at bedtime as needed.     . colesevelam (WELCHOL) 625 MG tablet Take 625-1,250 mg by mouth as needed. For diarrhea     . dutasteride (AVODART) 0.5 MG capsule Take 0.5 mg by mouth every other day.      . fish oil-omega-3 fatty acids 1000 MG capsule Take 1 g by mouth daily.      Marland Kitchen lamoTRIgine (LAMICTAL) 200 MG tablet Take 200 mg by mouth at bedtime.      Marland Kitchen levothyroxine (SYNTHROID) 50 MCG tablet Take 1 tablet (50 mcg total) by mouth daily before breakfast. 90 tablet 3  . Lutein 20 MG TABS Take 1 tablet by mouth daily. Reported on 11/29/2015    . mometasone (NASONEX) 50 MCG/ACT nasal spray Place 2 sprays into the nose daily. 17 g 11  . montelukast (SINGULAIR) 10 MG tablet Take 1 tablet (10 mg total) by mouth daily. Yearly physical due in May must see MD for future refills 90 tablet 0  . Multiple Vitamins-Minerals (MULTIVITAMIN,TX-MINERALS) tablet Take 1 tablet by mouth daily.      Marland Kitchen omeprazole (PRILOSEC) 40 MG capsule Take 40 mg by mouth daily as needed.     . Selenium 200 MCG CAPS Take 1 capsule by mouth daily. Reported on 11/29/2015    . zaleplon (SONATA) 5 MG capsule Take 5 mg by mouth at bedtime.       No facility-administered medications  prior to visit.     ROS Review of Systems  Constitutional: Negative for appetite change, fatigue and unexpected weight change.  HENT: Negative for congestion, nosebleeds, sneezing, sore throat and trouble swallowing.   Eyes: Negative for itching and visual disturbance.  Respiratory: Negative for cough.   Cardiovascular: Negative for chest pain, palpitations and leg swelling.  Gastrointestinal: Negative for abdominal distention, blood in stool, diarrhea and nausea.  Genitourinary: Negative for frequency and hematuria.  Musculoskeletal: Negative for back pain, gait problem, joint swelling and neck pain.  Skin: Negative for rash.  Neurological: Negative for dizziness, tremors, speech difficulty and weakness.  Psychiatric/Behavioral: Positive for dysphoric mood. Negative for agitation and sleep disturbance. The patient is not nervous/anxious.     Objective:  BP 128/86 (BP Location: Left Arm, Patient Position: Sitting, Cuff Size: Normal)   Pulse 84   Temp 98.6 F (37 C) (Oral)   Ht 6' (1.829 m)   Wt 156 lb (70.8 kg)   SpO2 99%   BMI 21.16 kg/m   BP Readings from Last 3 Encounters:  12/01/16 128/86  05/30/16 118/70  11/29/15 140/90    Wt Readings from Last 3 Encounters:  12/01/16 156 lb (70.8 kg)  05/30/16 161 lb (73 kg)  11/29/15 173 lb (78.5 kg)    Physical Exam  Constitutional: He is oriented to person, place, and time. He appears well-developed and well-nourished. No distress.  HENT:  Head: Normocephalic and atraumatic.  Right Ear: External ear normal.  Left Ear: External ear normal.  Nose: Nose normal.  Mouth/Throat: Oropharynx is clear and moist. No oropharyngeal exudate.  Eyes: Conjunctivae and EOM are normal. Pupils are equal, round, and reactive to light. Right eye exhibits no discharge. Left eye exhibits no discharge. No scleral icterus.  Neck: Normal range of motion. Neck supple. No JVD present. No tracheal deviation present. No thyromegaly present.    Cardiovascular: Normal rate, regular rhythm, normal heart sounds and intact distal pulses.  Exam reveals no gallop and no friction rub.   No murmur heard. Pulmonary/Chest: Effort normal and breath sounds normal. No stridor. No respiratory distress. He has no wheezes. He has no rales. He exhibits no tenderness.  Abdominal: Soft. Bowel sounds are normal. He exhibits no distension and no mass. There is no tenderness. There is no rebound and no guarding.  Genitourinary: Rectum normal, prostate normal and penis normal. Rectal exam shows guaiac negative stool. No penile tenderness.  Musculoskeletal: Normal range of motion. He exhibits no edema or tenderness.  Lymphadenopathy:    He has no cervical adenopathy.  Neurological: He is alert and oriented to person, place, and time. He has normal reflexes. No cranial nerve deficit. He exhibits normal muscle tone. Coordination normal.  Skin: Skin is warm and dry. No rash noted. He is not diaphoretic. No erythema. No pallor.  Psychiatric: His behavior is normal. Judgment and thought content normal.  sad Rectal - per Urology  Lab Results  Component Value Date   WBC 3.9 (L) 11/29/2015   HGB 16.7 11/29/2015   HCT 48.0 11/29/2015   PLT 204.0 11/29/2015   GLUCOSE 99 05/30/2016   CHOL 222 (H) 11/29/2015   TRIG 67.0 11/29/2015   HDL 46.50 11/29/2015   LDLDIRECT 116.5 01/02/2011   LDLCALC 162 (H) 11/29/2015   ALT 15 11/29/2015   AST 33 11/29/2015   NA 141 05/30/2016   K 4.1 05/30/2016   CL 106 05/30/2016   CREATININE 0.89 05/30/2016   BUN 13 05/30/2016   CO2 27 05/30/2016   TSH 1.08 05/30/2016   PSA 5.20 (H) 05/08/2014   HGBA1C 5.2 11/29/2015    Mr Prostate W / Wo Cm  Result Date: 04/03/2014 CLINICAL DATA:  Elevated PSA level. Prior negative biopsy. History of prostatitis. EXAM: MR PROSTATE WITHOUT AND WITH CONTRAST TECHNIQUE: Multiplanar multisequence MRI images were obtained of the pelvis centered about the prostate. Pre and post contrast  images were obtained. CONTRAST:  53mL MULTIHANCE GADOBENATE DIMEGLUMINE 529 MG/ML IV SOLN COMPARISON:  Biopsy of 07/19/2008. FINDINGS: Prostate: Demonstrates moderate central gland enlargement and heterogeneity, most consistent with benign prostatic hyperplasia. No areas of peripheral zone T2 hypointensity to suggest carcinoma. No restricted diffusion or early post-contrast enhancement. Transcapsular spread:  Absent Seminal vesicle involvement: Absent Neurovascular bundle involvement: Absent Pelvic adenopathy: Absent Bone metastasis: Absent Other findings: Normal pelvic bowel loops and urinary bladder, without significant free pelvic fluid. IMPRESSION: Findings of benign prostatic hyperplasia. No evidence of prostate carcinoma. Electronically Signed   By: Abigail Miyamoto M.D.   On: 04/03/2014 16:33    Assessment & Plan:   There are no diagnoses linked to this encounter. I am having Mr. Belcher maintain his amantadine, dutasteride, clonazePAM, lamoTRIgine, (multivitamin,tx-minerals),  Cholecalciferol, colesevelam, buPROPion, zaleplon, Selenium, fish oil-omega-3 fatty acids, Lutein, omeprazole, aspirin, mometasone, levothyroxine, and montelukast.  No orders of the defined types were placed in this encounter.    Follow-up: No Follow-up on file.  Walker Kehr, MD

## 2016-12-01 NOTE — Assessment & Plan Note (Signed)
He had a spot on the tongue - ?pre-cancerous - seeing an oncologist

## 2016-12-04 DIAGNOSIS — K148 Other diseases of tongue: Secondary | ICD-10-CM | POA: Diagnosis not present

## 2016-12-12 DIAGNOSIS — F3174 Bipolar disorder, in full remission, most recent episode manic: Secondary | ICD-10-CM | POA: Diagnosis not present

## 2017-01-16 ENCOUNTER — Encounter: Payer: Self-pay | Admitting: Emergency Medicine

## 2017-01-16 ENCOUNTER — Emergency Department
Admission: EM | Admit: 2017-01-16 | Discharge: 2017-01-17 | Disposition: A | Discharge status: Other Acute Inpatient Hospital | Payer: Federal, State, Local not specified - PPO | Attending: Emergency Medicine | Admitting: Emergency Medicine

## 2017-01-16 DIAGNOSIS — F319 Bipolar disorder, unspecified: Secondary | ICD-10-CM | POA: Diagnosis not present

## 2017-01-16 DIAGNOSIS — F3110 Bipolar disorder, current episode manic without psychotic features, unspecified: Secondary | ICD-10-CM | POA: Diagnosis not present

## 2017-01-16 DIAGNOSIS — Z87891 Personal history of nicotine dependence: Secondary | ICD-10-CM | POA: Diagnosis not present

## 2017-01-16 DIAGNOSIS — E039 Hypothyroidism, unspecified: Secondary | ICD-10-CM | POA: Diagnosis not present

## 2017-01-16 DIAGNOSIS — F309 Manic episode, unspecified: Secondary | ICD-10-CM | POA: Diagnosis present

## 2017-01-16 LAB — COMPREHENSIVE METABOLIC PANEL
ALBUMIN: 5.4 g/dL — AB (ref 3.5–5.0)
ALK PHOS: 70 U/L (ref 38–126)
ALT: 15 U/L — ABNORMAL LOW (ref 17–63)
ANION GAP: 9 (ref 5–15)
AST: 34 U/L (ref 15–41)
BUN: 21 mg/dL — ABNORMAL HIGH (ref 6–20)
CALCIUM: 9.9 mg/dL (ref 8.9–10.3)
CO2: 24 mmol/L (ref 22–32)
Chloride: 105 mmol/L (ref 101–111)
Creatinine, Ser: 0.95 mg/dL (ref 0.61–1.24)
GFR calc Af Amer: >60 mL/min (ref >60)
GFR calc non Af Amer: >60 mL/min (ref >60)
GLUCOSE: 114 mg/dL — AB (ref 65–99)
Potassium: 3.8 mmol/L (ref 3.5–5.1)
SODIUM: 138 mmol/L (ref 135–145)
Total Bilirubin: 1.1 mg/dL (ref 0.3–1.2)
Total Protein: 7.5 g/dL (ref 6.5–8.1)

## 2017-01-16 LAB — CBC
HEMATOCRIT: 46.9 % (ref 40.0–52.0)
HEMOGLOBIN: 16.6 g/dL (ref 13.0–18.0)
MCH: 33.3 pg (ref 26.0–34.0)
MCHC: 35.4 g/dL (ref 32.0–36.0)
MCV: 94 fL (ref 80.0–100.0)
Platelets: 228 10*3/uL (ref 150–440)
RBC: 4.98 MIL/uL (ref 4.40–5.90)
RDW: 12.1 % (ref 11.5–14.5)
WBC: 7.2 10*3/uL (ref 3.8–10.6)

## 2017-01-16 LAB — URINE DRUG SCREEN, QUALITATIVE (ARMC ONLY)
AMPHETAMINES, UR SCREEN: NOT DETECTED
Barbiturates, Ur Screen: NOT DETECTED
Benzodiazepine, Ur Scrn: NOT DETECTED
COCAINE METABOLITE, UR ~~LOC~~: NOT DETECTED
Cannabinoid 50 Ng, Ur ~~LOC~~: NOT DETECTED
MDMA (ECSTASY) UR SCREEN: NOT DETECTED
METHADONE SCREEN, URINE: NOT DETECTED
OPIATE, UR SCREEN: NOT DETECTED
Phencyclidine (PCP) Ur S: NOT DETECTED
Tricyclic, Ur Screen: NOT DETECTED

## 2017-01-16 LAB — ETHANOL: Alcohol, Ethyl (B): <5 mg/dL (ref <5)

## 2017-01-16 LAB — ACETAMINOPHEN LEVEL

## 2017-01-16 LAB — SALICYLATE LEVEL: Salicylate Lvl: <7 mg/dL (ref 2.8–30.0)

## 2017-01-16 MED ORDER — PANTOPRAZOLE SODIUM 40 MG PO TBEC
40.0000 mg | DELAYED_RELEASE_TABLET | Freq: Every day | ORAL | Status: DC
  Administered 2017-01-17: 40 mg via ORAL
  Filled 2017-01-16: qty 1

## 2017-01-16 MED ORDER — DUTASTERIDE 0.5 MG PO CAPS
0.5000 mg | ORAL_CAPSULE | ORAL | Status: DC
  Administered 2017-01-17: 0.5 mg via ORAL
  Filled 2017-01-16 (×2): qty 1

## 2017-01-16 MED ORDER — LEVOTHYROXINE SODIUM 25 MCG PO TABS
50.0000 ug | ORAL_TABLET | Freq: Every day | ORAL | Status: DC
  Administered 2017-01-17: 50 ug via ORAL
  Filled 2017-01-16: qty 2

## 2017-01-16 MED ORDER — ASPIRIN EC 81 MG PO TBEC
81.0000 mg | DELAYED_RELEASE_TABLET | Freq: Two times a day (BID) | ORAL | Status: DC
  Administered 2017-01-17 (×2): 81 mg via ORAL
  Filled 2017-01-16 (×2): qty 1

## 2017-01-16 MED ORDER — QUETIAPINE FUMARATE 25 MG PO TABS
50.0000 mg | ORAL_TABLET | Freq: Every day | ORAL | Status: DC
  Administered 2017-01-17: 50 mg via ORAL
  Filled 2017-01-16: qty 2

## 2017-01-16 MED ORDER — MONTELUKAST SODIUM 10 MG PO TABS
10.0000 mg | ORAL_TABLET | Freq: Every day | ORAL | Status: DC
  Administered 2017-01-17: 10 mg via ORAL
  Filled 2017-01-16: qty 1

## 2017-01-16 MED ORDER — DIVALPROEX SODIUM 500 MG PO DR TAB
500.0000 mg | DELAYED_RELEASE_TABLET | Freq: Two times a day (BID) | ORAL | Status: DC
  Administered 2017-01-17 (×2): 500 mg via ORAL
  Filled 2017-01-16 (×2): qty 1

## 2017-01-16 NOTE — ED Provider Notes (Signed)
-----------------------------------------   11:29 PM on 01/16/2017 -----------------------------------------  Discussed with [facility] psychiatrist Dr. Roosevelt Locks who will maintain patient's IVC. Recommends starting depakote 500mg  twice for mood stabilization and seroquel 50mg  qhs. Does not recommend restarting lamictal or wellbutrin.  ----------------------------------------- 6:37 AM on 01/17/2017 -----------------------------------------  No events overnight. Patient was transferred to the [facility] pending psychiatric disposition.   Paulette Blanch, MD 01/17/17 [phone]

## 2017-01-16 NOTE — ED Triage Notes (Signed)
Pt here IVC by [facility] for manic behavior, IVC papers reports pt has been noncompliant with meds, believes he is God. Pt denies SI, HI; reports med compliance.

## 2017-01-16 NOTE — ED Provider Notes (Signed)
[facility] Emergency Department Provider Note       Time seen: ----------------------------------------- 5:30 PM on 01/16/2017 -----------------------------------------     I have reviewed the triage vital signs and the nursing notes.   HISTORY   Chief Complaint Manic Behavior    HPI Adam Keller is a 61 y.o. male who presents to the ED for manic behavior being brought here via involuntary commitment from [place]. I received papers report that he has been noncompliant with medication, he believes he is God. He denies suicidal or homicidal ideation and he denies medication noncompliance.   Past Medical History:  Diagnosis Date  . Allergic rhinitis   . BPH (benign prostatic hyperplasia)    elev PSA resolved after abx 2008 Dr Diona Fanti (PSA 5.15 in 8/09)  . Depression    h/o bipolar Dr Letitia Neri at [facility]  . 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 Elevated glucose   . Headache(784.0)   . Horner's syndrome   . Hyperlipidemia   . Hypothyroidism     Patient Active Problem List   Diagnosis Date Noted  . Grief 12/01/2016  . Tongue lesion 12/01/2016  . Loss of weight 05/30/2016  . Well adult exam 05/08/2014  . Hyperglycemia 10/28/2012  . 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 Left facial pain 06/09/2011  . Lid lag 06/09/2011  . 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 PSA, INCREASED 07/10/2010  . ANKLE PAIN 04/08/2010  . Edema 04/08/2010  . SHOULDER PAIN 03/06/2010  . Diarrhea 03/28/2009  . HOARSENESS 03/07/2009  . Hypothyroidism 08/10/2007  . Dyslipidemia 08/10/2007  . Depression 08/10/2007  . ALLERGIC RHINITIS 08/10/2007  . BPH (benign prostatic hyperplasia) 08/10/2007    Past Surgical History:  Procedure Laterality Date  . PROSTATE BIOPSY    . Vocal Cord Surgery  2011   implants    Allergies Fluoxetine hcl; Azithromycin; Cephalexin; Erythromycin; and Ketorolac tromethamine  Social  History Social History  Substance Use Topics  . Smoking status: Former Smoker    Types: Cigarettes, Cigars    Quit date: 07/28/2008  . Smokeless tobacco: Never Used  . Alcohol use No    Review of Systems Constitutional: Negative for fever. Cardiovascular: Negative for chest pain. Respiratory: Negative for shortness of breath. Gastrointestinal: Negative for abdominal pain, vomiting and diarrhea. Genitourinary: Negative for dysuria. Musculoskeletal: Negative for back pain. Skin: Negative for rash. Neurological: Negative for headaches, focal weakness or numbness. Psychiatric: Negative for suicidal or homicidal ideation  All systems negative/normal/unremarkable except as stated in the HPI  ____________________________________________   PHYSICAL EXAM:  VITAL SIGNS: ED Triage Vitals [01/16/17 1708]  Enc Vitals Group     BP (!) 137/108     Pulse Rate 89     Resp 16     Temp 98.2 F (36.8 C)     Temp Source Oral     SpO2 98 %     Weight 160 lb (72.6 kg)     Height 6' (1.829 m)     Head Circumference      Peak Flow      Pain Score 0     Pain Loc      Pain Edu?      Excl. in [place]?     Constitutional: Alert and oriented. Well appearing and in no distress. Eyes: Conjunctivae are normal. Normal extraocular movements. ENT   Head: Normocephalic and atraumatic.   Nose: No congestion/rhinnorhea.   Mouth/Throat: Mucous membranes are moist.  Neck: No stridor. Cardiovascular: Normal rate, regular rhythm. No murmurs, rubs, or gallops. Respiratory: Normal respiratory effort without tachypnea nor retractions. Breath sounds are clear and equal bilaterally. No wheezes/rales/rhonchi. Gastrointestinal: Soft and nontender. Normal bowel sounds Musculoskeletal: Nontender with normal range of motion in extremities. No lower extremity tenderness nor edema. Neurologic:  Normal speech and language. No gross focal neurologic deficits are appreciated.  Skin:  Skin is warm, dry and  intact. No rash noted. Psychiatric: Mood and affect are normal. Speech and behavior are normal.  ____________________________________________  ED COURSE:  Pertinent labs & imaging results that were available during my care of the patient were reviewed by me and considered in my medical decision making (see chart for details). Patient presents for manic behavior, we will assess with labs and imaging as indicated.   Procedures ____________________________________________   LABS (pertinent positives/negatives)  Labs Reviewed  COMPREHENSIVE METABOLIC PANEL - Abnormal; Notable for the following:       Result Value   Glucose, Bld 114 (*)    BUN 21 (*)    Albumin 5.4 (*)    ALT 15 (*)    All other components within normal limits  CBC  URINE DRUG SCREEN, QUALITATIVE ([facility] ONLY)  ETHANOL  SALICYLATE LEVEL  ACETAMINOPHEN LEVEL   ____________________________________________  FINAL ASSESSMENT AND PLAN  Manic behavior, IVC  Plan: Patient's labs were dictated above. Patient had presented for Manic behavior and does express hyperreligiosity. We will continue involuntary commitment and consult Tele-psychiatry.   Earleen Newport, MD   Note: This note was generated in part or whole with voice recognition software. Voice recognition is usually quite accurate but there are transcription errors that can and very often do occur. I apologize for any typographical errors that were not detected and corrected.     Earleen Newport, MD 01/16/17 [phone]

## 2017-01-16 NOTE — ED Notes (Signed)
Report given to [facility] psych att Dr Roosevelt Locks

## 2017-01-16 NOTE — ED Notes (Signed)
IVC 

## 2017-01-17 DIAGNOSIS — F3110 Bipolar disorder, current episode manic without psychotic features, unspecified: Secondary | ICD-10-CM | POA: Diagnosis not present

## 2017-01-17 DIAGNOSIS — E039 Hypothyroidism, unspecified: Secondary | ICD-10-CM | POA: Diagnosis not present

## 2017-01-17 DIAGNOSIS — Z9114 Patient's other noncompliance with medication regimen: Secondary | ICD-10-CM | POA: Diagnosis not present

## 2017-01-17 DIAGNOSIS — Z87891 Personal history of nicotine dependence: Secondary | ICD-10-CM | POA: Diagnosis not present

## 2017-01-17 DIAGNOSIS — F312 Bipolar disorder, current episode manic severe with psychotic features: Secondary | ICD-10-CM | POA: Diagnosis not present

## 2017-01-17 NOTE — ED Notes (Signed)
Pt discharged to [place]. Belongings sent with officers. Pt accepting of transfer. Pt's sister aware of patient's disposition.

## 2017-01-17 NOTE — ED Notes (Signed)
BEHAVIORAL HEALTH ROUNDING Patient sleeping: Yes.   Patient alert and oriented: not applicable SLEEPING Behavior appropriate: Yes.  ; If no, describe: SLEEPING Nutrition and fluids offered: No SLEEPING Toileting and hygiene offered: NoSLEEPING Sitter present: not applicable, Q 15 min safety rounds and observation via security camera. Law enforcement present: Yes ODS 

## 2017-01-17 NOTE — ED Notes (Addendum)
Pt aware his transportation will be here shortly. Pt accepting. Pt remains calm and pleasant. Maintained on 15 minute checks and observation by security camera for safety.

## 2017-01-17 NOTE — ED Notes (Signed)
BEHAVIORAL HEALTH ROUNDING  Patient sleeping: No.  Patient alert and oriented: yes  Behavior appropriate: Yes. ; If no, describe:  Nutrition and fluids offered: Yes  Toileting and hygiene offered: Yes  Sitter present: not applicable, Q 15 min safety rounds and observation via security camera. Law enforcement present: Yes ODS  

## 2017-01-17 NOTE — ED Notes (Signed)
Pt speaking on the phone with his sister.

## 2017-01-17 NOTE — ED Notes (Signed)
Pt in dayroom engaged in conversation with another patient.  Patient is offering legal and medical advice. Pt is hyper religious, with pressured speech. Maintained on 15 minute checks and observation by security camera for safety.

## 2017-01-17 NOTE — ED Notes (Signed)
BEHAVIORAL HEALTH ROUNDING  Patient sleeping: No.  Patient alert and oriented: yes  Behavior appropriate: Yes. ; If no, describe:  Nutrition and fluids offered: Yes  Toileting and hygiene offered: Yes  Sitter present: not applicable, Q 15 min safety rounds and observation via security camera. Law enforcement present: Yes ODS  Pt up to bathroom and returned to bed

## 2017-01-17 NOTE — ED Notes (Signed)
Report called to Larene Beach, Therapist, sports at [facility]. This Probation officer will call facility when patient is on his way with [employer].

## 2017-01-17 NOTE — ED Notes (Signed)
ENVIRONMENTAL ASSESSMENT  Potentially harmful objects out of patient reach: Yes.  Personal belongings secured: Yes.  Patient dressed in hospital provided attire only: Yes.  Plastic bags out of patient reach: Yes.  Patient care equipment (cords, cables, call bells, lines, and drains) shortened, removed, or accounted for: Yes.  Equipment and supplies removed from bottom of stretcher: Yes.  Potentially toxic materials out of patient reach: Yes.  Sharps container removed or out of patient reach: Yes.   BEHAVIORAL HEALTH ROUNDING  Patient sleeping: No.  Patient alert and oriented: yes  Behavior appropriate: Yes. ; If no, describe:  Nutrition and fluids offered: Yes  Toileting and hygiene offered: Yes  Sitter present: not applicable, Q 15 min safety rounds and observation via security camera. Law enforcement present: Yes ODS  Pt brought into ED BHU via sally port and wand with metal detector for safety by ODS officer. Patient oriented to unit/care area: Pt informed of unit policies and procedures.  Informed that, for their safety, care areas are designed for safety and monitored by security cameras at all times; and visiting hours explained to patient. Patient verbalizes understanding, and verbal contract for safety obtained.Pt shown to their room.   ED BHU [place]  Is the patient under IVC or is there intent for IVC: Yes.  Is the patient medically cleared: Yes.  Is there vacancy in the ED BHU: Yes.  Is the population mix appropriate for patient: Yes.  Is the patient awaiting placement in inpatient or outpatient setting: Yes.  Has the patient had a psychiatric consult: Yes.  Survey of unit performed for contraband, proper placement and condition of furniture, tampering with fixtures in bathroom, shower, and each patient room: Yes. ; Findings: All clear  APPEARANCE/BEHAVIOR  calm, cooperative and adequate rapport can be established  NEURO ASSESSMENT  Orientation: time, place  and person  Hallucinations: No.None noted (Hallucinations) pt is having delusions saying he is God.  Speech:pressured and rapid Gait: normal  RESPIRATORY ASSESSMENT  WNL  CARDIOVASCULAR ASSESSMENT  WNL  GASTROINTESTINAL ASSESSMENT  WNL  EXTREMITIES  WNL  PLAN OF CARE  Provide calm/safe environment. Vital signs assessed TID. ED BHU Assessment once each 12-hour shift. Collaborate with TTS daily or as condition indicates. Assure the ED provider has rounded once each shift. Provide and encourage hygiene. Provide redirection as needed. Assess for escalating behavior; address immediately and inform ED provider.  Assess family dynamic and appropriateness for visitation as needed: Yes. ; If necessary, describe findings:  Educate the patient/family about BHU procedures/visitation: Yes. ; If necessary, describe findings: Pt is calm and cooperative at this time. Pt understanding and accepting of unit procedures/rules. Will continue to monitor with Q 15 min safety rounds and observation via security camera.

## 2017-01-17 NOTE — BH Assessment (Signed)
Pt has been accepted at [facility] for placement. Accepting MD:  Dr. Kandy Garrison Building Call report number [phone] Pt can arrive after 10 am.

## 2017-01-17 NOTE — ED Notes (Signed)
Patient has been accepted to [facility].  Patient assigned to room TBD Accepting physician is Dr. Enzo Bi .  Call report to [phone].  ER Staff is aware of it [facility] ER Sect.;  ER MD & Amy Patient's Nurse)

## 2017-01-17 NOTE — BH Assessment (Signed)
Referral information for placement has been submitted to:  [facility]

## 2017-01-17 NOTE — ED Notes (Signed)
Pt is taking a shower.

## 2017-01-17 NOTE — BH Assessment (Signed)
Assessment Note  Adam Keller is an 61 y.o. male presenting to the ED, under IVC, initiated by [facility] for manic behavior and medication non-compliance.  Patient denies SI/HI and any auditory/visual hallucinations.  He denies the allegations in the IVC.  He states, "I told a good story to get myself checked in here and now I'm going to tell a story to get myself discharged".  Pt denies drug/alcohol use.  Diagnosis: Depression  Past Medical History:  Past Medical History:  Diagnosis Date  . Allergic rhinitis   . BPH (benign prostatic hyperplasia)    elev PSA resolved after abx 2008 Dr Diona Fanti (PSA 5.15 in 8/09)  . Depression    h/o bipolar Dr Letitia Neri at [facility]  . 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 Elevated glucose   . Headache(784.0)   . Horner's syndrome   . Hyperlipidemia   . Hypothyroidism     Past Surgical History:  Procedure Laterality Date  . PROSTATE BIOPSY    . Vocal Cord Surgery  2011   implants    Family History:  Family History  Problem Relation Age of Onset  . Heart disease Mother        CHF  . Colon cancer Mother   . Diabetes Mother   . Hyperlipidemia Mother   . Heart disease Father        CHF  . Hypertension Other     Social History:  reports that he quit smoking about 8 years ago. His smoking use included Cigarettes and Cigars. He has never used smokeless tobacco. He reports that he does not drink alcohol or use drugs.  Additional Social History:  Alcohol / Drug Use Pain Medications: See PTA Prescriptions: See PTA Over the Counter: See PTA History of alcohol / drug use?: No history of alcohol / drug abuse  CIWA: CIWA-Ar BP: (!) 135/92 Pulse Rate: 83 COWS:    Allergies:  Allergies  Allergen Reactions  . Fluoxetine Hcl Other (See Comments)    "severe mental reaction"  . Azithromycin Diarrhea  . Cephalexin Rash  . Erythromycin Nausea Only  . Ketorolac Tromethamine Nausea And Vomiting    Home Medications:  (Not in a hospital  admission)  OB/GYN Status:  No LMP for male patient.  General Assessment Data Location of Assessment: [facility] ED TTS Assessment: In system Is this a Tele or Face-to-Face Assessment?: Face-to-Face Is this an Initial Assessment or a Re-assessment for this encounter?: Initial Assessment Marital status: Single Maiden name: n/a Is patient pregnant?: No Pregnancy Status: No Living Arrangements: Alone Can pt return to current living arrangement?: Yes Admission Status: Voluntary Is patient capable of signing voluntary admission?: No Referral Source: Psychiatrist Insurance type: [place] Living Arrangements: Alone Legal Guardian: Other: (self) Name of Psychiatrist: [place] Name of Therapist: [facility]  Education Status Is patient currently in school?: No Current Grade: n/a Highest grade of 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 completed: n/a Name of school: n/a Contact person: n/a  Risk to self with the past 6 months Suicidal Ideation: No Has patient been a risk to self within the past 6 months prior to admission? : No Suicidal Intent: No Has patient had any suicidal intent within the past 6 months prior to admission? : No Is patient at risk for suicide?: No Suicidal Plan?: No Has patient had any suicidal plan within the past 6 months prior to admission? : No Access to Means: No What has been your use of drugs/alcohol within the last  12 months?: Pt denies drug/alcohol use Previous Attempts/Gestures: No How many times?: 0 Other Self Harm Risks: none identified Triggers for Past Attempts: None known Intentional Self Injurious Behavior: None Family Suicide History: Yes Recent stressful life event(s): Other (Comment) Persecutory voices/beliefs?: No Depression: No Substance abuse history and/or treatment for substance abuse?: No Suicide prevention information given to non-admitted patients: Not applicable  Risk to Others within the past 6 months Homicidal Ideation: No Does patient have  any lifetime risk of violence toward others beyond the six months prior to admission? : No Thoughts of Harm to Others: No Current Homicidal Intent: No Current Homicidal Plan: No Access to Homicidal Means: No History of harm to others?: No Assessment of Violence: None Noted Does patient have access to weapons?: No Criminal Charges Pending?: No Does patient have a court date: No Is patient on probation?: No  Psychosis Hallucinations: None noted Delusions: Unspecified  Mental Status Report Appearance/Hygiene: In scrubs Eye Contact: Good Motor Activity: Freedom of movement Speech: Logical/coherent Level of Consciousness: Alert Mood: Pleasant Affect: Appropriate to circumstance Anxiety Level: Minimal Thought Processes: Relevant Judgement: Unimpaired Orientation: Person, Place, Time Obsessive Compulsive Thoughts/Behaviors: Minimal  Cognitive Functioning Concentration: Normal Memory: Recent Intact, Remote Intact IQ: Average Insight: Fair Impulse Control: Fair Appetite: Fair Sleep: No Change Vegetative Symptoms: None  ADLScreening [facility] Assessment Services) Patient's cognitive ability adequate to safely complete daily activities?: Yes Patient able to express need for assistance with ADLs?: Yes Independently performs ADLs?: Yes (appropriate for developmental age)  Prior Inpatient Therapy Prior Inpatient Therapy: Yes Prior Therapy Dates: unknown Prior Therapy Facilty/Provider(s): unknown Reason for Treatment: unknown  Prior Outpatient Therapy Prior Outpatient Therapy: Yes Prior Therapy Dates: current Prior Therapy Facilty/Provider(s): [facility] Reason for Treatment: psychosis Does patient have an ACCT team?: No Does patient have Intensive In-House Services?  : No Does patient have [facility] services? : No Does patient have P4CC services?: No  ADL Screening (condition at time of admission) Patient's cognitive ability adequate to safely complete daily activities?:  Yes Patient able to express need for assistance with ADLs?: Yes Independently performs ADLs?: Yes (appropriate for developmental age)       Abuse/Neglect Assessment (Assessment to be complete while patient is alone) Physical Abuse: Denies Verbal Abuse: Denies Sexual Abuse: Denies Exploitation of patient/patient's resources: Denies Self-Neglect: Denies Values / Beliefs Cultural Requests During Hospitalization: None Spiritual Requests During Hospitalization: None Consults Spiritual Care Consult Needed: No Social Work Consult Needed: No Regulatory affairs officer (For Healthcare) Does Patient Have a Medical Advance Directive?: No Would patient like information on creating a medical advance directive?: No - Patient declined    Additional Information 1:1 In Past 12 Months?: No CIRT Risk: No Elopement Risk: No Does patient have medical clearance?: Yes     Disposition:  Disposition Initial Assessment Completed for this Encounter: Yes Disposition of Patient: Inpatient treatment program Type of inpatient treatment program: Adult  On Site Evaluation by:   Reviewed with Physician:    Oneita Hurt 01/17/2017 5:06 AM

## 2017-01-17 NOTE — ED Notes (Signed)
Pt in dayroom interacting with other patients. Pt has been informed he will be transferred to [facility] today. Pt accepting. Maintained on 15 minute checks and observation by security camera for safety.

## 2017-01-18 DIAGNOSIS — F312 Bipolar disorder, current episode manic severe with psychotic features: Secondary | ICD-10-CM | POA: Diagnosis not present

## 2017-01-19 DIAGNOSIS — F312 Bipolar disorder, current episode manic severe with psychotic features: Secondary | ICD-10-CM | POA: Diagnosis not present

## 2017-01-20 DIAGNOSIS — F312 Bipolar disorder, current episode manic severe with psychotic features: Secondary | ICD-10-CM | POA: Diagnosis not present

## 2017-01-27 DIAGNOSIS — F312 Bipolar disorder, current episode manic severe with psychotic features: Secondary | ICD-10-CM | POA: Diagnosis not present

## 2017-01-28 DIAGNOSIS — F312 Bipolar disorder, current episode manic severe with psychotic features: Secondary | ICD-10-CM | POA: Diagnosis not present

## 2017-01-29 DIAGNOSIS — F312 Bipolar disorder, current episode manic severe with psychotic features: Secondary | ICD-10-CM | POA: Diagnosis not present

## 2017-01-30 DIAGNOSIS — F3174 Bipolar disorder, in full remission, most recent episode manic: Secondary | ICD-10-CM | POA: Diagnosis not present

## 2017-02-05 DIAGNOSIS — K080 Exfoliation of teeth due to systemic causes: Secondary | ICD-10-CM | POA: Diagnosis not present

## 2017-02-20 DIAGNOSIS — F3174 Bipolar disorder, in full remission, most recent episode manic: Secondary | ICD-10-CM | POA: Diagnosis not present

## 2017-03-03 ENCOUNTER — Other Ambulatory Visit (INDEPENDENT_AMBULATORY_CARE_PROVIDER_SITE_OTHER): Payer: Federal, State, Local not specified - PPO

## 2017-03-03 ENCOUNTER — Ambulatory Visit (INDEPENDENT_AMBULATORY_CARE_PROVIDER_SITE_OTHER): Payer: Federal, State, Local not specified - PPO | Admitting: Internal Medicine

## 2017-03-03 ENCOUNTER — Encounter: Payer: Self-pay | Admitting: Internal Medicine

## 2017-03-03 VITALS — BP 128/80 | HR 71 | Temp 98.2°F | Ht 72.0 in | Wt 162.0 lb

## 2017-03-03 DIAGNOSIS — E039 Hypothyroidism, unspecified: Secondary | ICD-10-CM

## 2017-03-03 DIAGNOSIS — F4320 Adjustment disorder, unspecified: Secondary | ICD-10-CM | POA: Diagnosis not present

## 2017-03-03 DIAGNOSIS — R972 Elevated prostate specific antigen [PSA]: Secondary | ICD-10-CM | POA: Diagnosis not present

## 2017-03-03 DIAGNOSIS — R7309 Other abnormal glucose: Secondary | ICD-10-CM

## 2017-03-03 DIAGNOSIS — F319 Bipolar disorder, unspecified: Secondary | ICD-10-CM

## 2017-03-03 DIAGNOSIS — F3130 Bipolar disorder, current episode depressed, mild or moderate severity, unspecified: Secondary | ICD-10-CM

## 2017-03-03 DIAGNOSIS — F4321 Adjustment disorder with depressed mood: Secondary | ICD-10-CM

## 2017-03-03 DIAGNOSIS — R634 Abnormal weight loss: Secondary | ICD-10-CM | POA: Diagnosis not present

## 2017-03-03 DIAGNOSIS — J301 Allergic rhinitis due to pollen: Secondary | ICD-10-CM | POA: Diagnosis not present

## 2017-03-03 LAB — HEPATIC FUNCTION PANEL
ALT: 10 U/L (ref 0–53)
AST: 21 U/L (ref 0–37)
Albumin: 4.4 g/dL (ref 3.5–5.2)
Alkaline Phosphatase: 69 U/L (ref 39–117)
BILIRUBIN DIRECT: 0.1 mg/dL (ref 0.0–0.3)
TOTAL PROTEIN: 6.8 g/dL (ref 6.0–8.3)
Total Bilirubin: 0.7 mg/dL (ref 0.2–1.2)

## 2017-03-03 LAB — BASIC METABOLIC PANEL
BUN: 17 mg/dL (ref 6–23)
CALCIUM: 9.7 mg/dL (ref 8.4–10.5)
CHLORIDE: 103 meq/L (ref 96–112)
CO2: 29 meq/L (ref 19–32)
CREATININE: 1.09 mg/dL (ref 0.40–1.50)
GFR: 73.37 mL/min (ref >60.00)
GLUCOSE: 108 mg/dL — AB (ref 70–99)
Potassium: 4.3 mEq/L (ref 3.5–5.1)
Sodium: 138 mEq/L (ref 135–145)

## 2017-03-03 LAB — HEMOGLOBIN A1C: Hgb A1c MFr Bld: 5.1 % (ref 4.6–6.5)

## 2017-03-03 NOTE — Assessment & Plan Note (Signed)
Wt Readings from Last 3 Encounters:  03/03/17 162 lb (73.5 kg)  01/16/17 150 lb (68 kg)  12/01/16 156 lb (70.8 kg)

## 2017-03-03 NOTE — Assessment & Plan Note (Signed)
On levothroid 

## 2017-03-03 NOTE — Assessment & Plan Note (Signed)
Nasonex Claritin - re-start

## 2017-03-03 NOTE — Assessment & Plan Note (Signed)
Discussed.

## 2017-03-03 NOTE — Assessment & Plan Note (Signed)
PSA q 6 mo. follow-up with [facility]  

## 2017-03-03 NOTE — Assessment & Plan Note (Signed)
Adam Keller had a manic episode in July - hospitalized. Dr Loreli Slot at [facility] - Lithium, Zyprexa

## 2017-03-03 NOTE — Progress Notes (Signed)
Subjective:  Patient ID: Adam Keller, male    DOB: Dec 08, 1955  Age: 61 y.o. MRN: [id number]  CC: No chief complaint on file.   HPI Adam Keller presents for hypothyroidism, BPH, bipolar disorder. Adam Keller is grieving. Adam Keller had a manic episode in July - hospitalized. Dr Loreli Slot at [facility] - Lithium, Zyprexa He bought a camper C/o allergies  Outpatient Medications Prior to Visit  Medication Sig Dispense Refill  . amantadine (SYMMETREL) 100 MG capsule Take 100 mg by mouth daily.      Marland Kitchen aspirin 81 MG EC tablet Take 1 tablet (81 mg total) by mouth 2 (two) times daily. 100 tablet 5  . Cholecalciferol 1000 UNITS tablet Take 1,000 Units by mouth daily.      . colesevelam (WELCHOL) 625 MG tablet Take 625-1,250 mg by mouth as needed. For diarrhea     . dutasteride (AVODART) 0.5 MG capsule Take 0.5 mg by mouth every other day.      . fish oil-omega-3 fatty acids 1000 MG capsule Take 1 g by mouth daily.      Marland Kitchen lamoTRIgine (LAMICTAL) 200 MG tablet Take 200 mg by mouth at bedtime.      Marland Kitchen levothyroxine (SYNTHROID) 50 MCG tablet Take 1 tablet (50 mcg total) by mouth daily before breakfast. 90 tablet 3  . Lutein 20 MG TABS Take 1 tablet by mouth daily. Reported on 11/29/2015    . mometasone (NASONEX) 50 MCG/ACT nasal spray Place 2 sprays into the nose daily. 51 g 3  . montelukast (SINGULAIR) 10 MG tablet Take 1 tablet (10 mg total) by mouth daily. Yearly physical due in May must see MD for future refills 90 tablet 3  . Multiple Vitamins-Minerals (MULTIVITAMIN,TX-MINERALS) tablet Take 1 tablet by mouth daily.      Marland Kitchen omeprazole (PRILOSEC) 40 MG capsule Take 40 mg by mouth daily as needed.     . Selenium 200 MCG CAPS Take 1 capsule by mouth daily. Reported on 11/29/2015    . zaleplon (SONATA) 5 MG capsule Take 5 mg by mouth at bedtime.      Marland Kitchen buPROPion (WELLBUTRIN XL) 150 MG 24 hr tablet Take 300 mg by mouth every morning. Reported on 11/29/2015    . clonazePAM (KLONOPIN) 2 MG tablet Take 1 mg by mouth at  bedtime as needed.      No facility-administered medications prior to visit.     ROS Review of Systems  Constitutional: Negative for appetite change, fatigue and unexpected weight change.  HENT: Positive for congestion and rhinorrhea. Negative for nosebleeds, sneezing, sore throat and trouble swallowing.   Eyes: Negative for itching and visual disturbance.  Respiratory: Negative for cough.   Cardiovascular: Negative for chest pain, palpitations and leg swelling.  Gastrointestinal: Negative for abdominal distention, blood in stool, diarrhea and nausea.  Genitourinary: Negative for frequency and hematuria.  Musculoskeletal: Negative for back pain, gait problem, joint swelling and neck pain.  Skin: Negative for rash.  Neurological: Negative for dizziness, tremors, speech difficulty and weakness.  Psychiatric/Behavioral: Positive for decreased concentration, dysphoric mood and sleep disturbance. Negative for agitation and suicidal ideas. The patient is not nervous/anxious.     Objective:  BP 128/80 (BP Location: Left Arm, Patient Position: Sitting, Cuff Size: Normal)   Pulse 71   Temp 98.2 F (36.8 C) (Oral)   Ht 6' (1.829 m)   Wt 162 lb (73.5 kg)   SpO2 99%   BMI 21.97 kg/m   BP Readings from Last 3 Encounters:  03/03/17 128/80  01/17/17 132/80  12/01/16 128/86    Wt Readings from Last 3 Encounters:  03/03/17 162 lb (73.5 kg)  01/16/17 150 lb (68 kg)  12/01/16 156 lb (70.8 kg)    Physical Exam  Constitutional: He is oriented to person, place, and time. He appears well-developed and well-nourished. No distress.  HENT:  Head: Normocephalic and atraumatic.  Right Ear: External ear normal.  Left Ear: External ear normal.  Nose: Nose normal.  Mouth/Throat: Oropharynx is clear and moist. No oropharyngeal exudate.  Eyes: Pupils are equal, round, and reactive to light. Conjunctivae and EOM are normal. Right eye exhibits no discharge. Left eye exhibits no discharge. No scleral  icterus.  Neck: Normal range of motion. Neck supple. No JVD present. No tracheal deviation present. No thyromegaly present.  Cardiovascular: Normal rate, regular rhythm, normal heart sounds and intact distal pulses.  Exam reveals no gallop and no friction rub.   No murmur heard. Pulmonary/Chest: Effort normal and breath sounds normal. No stridor. No respiratory distress. He has no wheezes. He has no rales. He exhibits no tenderness.  Abdominal: Soft. Bowel sounds are normal. He exhibits no distension and no mass. There is no tenderness. There is no rebound and no guarding.  Genitourinary: Rectum normal, prostate normal and penis normal. Rectal exam shows guaiac negative stool. No penile tenderness.  Musculoskeletal: Normal range of motion. He exhibits no edema or tenderness.  Lymphadenopathy:    He has no cervical adenopathy.  Neurological: He is alert and oriented to person, place, and time. He has normal reflexes. No cranial nerve deficit. He exhibits normal muscle tone. Coordination normal.  Skin: Skin is warm and dry. No rash noted. He is not diaphoretic. No erythema. No pallor.  Psychiatric: He has a normal mood and affect. His behavior is normal. Judgment and thought content normal.    Lab Results  Component Value Date   WBC 7.2 01/16/2017   HGB 16.6 01/16/2017   HCT 46.9 01/16/2017   PLT 228 01/16/2017   GLUCOSE 114 (H) 01/16/2017   CHOL 170 12/01/2016   TRIG 47.0 12/01/2016   HDL 58.70 12/01/2016   LDLDIRECT 116.5 01/02/2011   LDLCALC 102 (H) 12/01/2016   ALT 15 (L) 01/16/2017   AST 34 01/16/2017   NA 138 01/16/2017   K 3.8 01/16/2017   CL 105 01/16/2017   CREATININE 0.95 01/16/2017   BUN 21 (H) 01/16/2017   CO2 24 01/16/2017   TSH 1.40 12/01/2016   PSA 5.20 (H) 05/08/2014   HGBA1C 5.2 11/29/2015    No results found.  Assessment & Plan:   There are no diagnoses linked to this encounter. I have discontinued Mr. Mousseau clonazePAM and buPROPion. I am also having him  maintain his amantadine, dutasteride, lamoTRIgine, (multivitamin,tx-minerals), Cholecalciferol, colesevelam, zaleplon, Selenium, fish oil-omega-3 fatty acids, Lutein, omeprazole, aspirin, levothyroxine, mometasone, montelukast, lithium carbonate, and OLANZapine.  Meds ordered this encounter  Medications  . lithium carbonate 300 MG capsule    Sig: Take by mouth.  . OLANZapine (ZYPREXA) 10 MG tablet    Sig: Take by mouth.     Follow-up: No Follow-up on file.  Walker Kehr, MD

## 2017-03-06 DIAGNOSIS — H40053 Ocular hypertension, bilateral: Secondary | ICD-10-CM | POA: Diagnosis not present

## 2017-03-17 DIAGNOSIS — F3174 Bipolar disorder, in full remission, most recent episode manic: Secondary | ICD-10-CM | POA: Diagnosis not present

## 2017-05-19 DIAGNOSIS — R972 Elevated prostate specific antigen [PSA]: Secondary | ICD-10-CM | POA: Diagnosis not present

## 2017-05-22 DIAGNOSIS — F3174 Bipolar disorder, in full remission, most recent episode manic: Secondary | ICD-10-CM | POA: Diagnosis not present

## 2017-05-27 DIAGNOSIS — R972 Elevated prostate specific antigen [PSA]: Secondary | ICD-10-CM | POA: Diagnosis not present

## 2017-05-27 DIAGNOSIS — N400 Enlarged prostate without lower urinary tract symptoms: Secondary | ICD-10-CM | POA: Diagnosis not present

## 2017-06-01 DIAGNOSIS — K080 Exfoliation of teeth due to systemic causes: Secondary | ICD-10-CM | POA: Diagnosis not present

## 2017-06-03 ENCOUNTER — Ambulatory Visit: Payer: Federal, State, Local not specified - PPO | Admitting: Internal Medicine

## 2017-06-03 ENCOUNTER — Encounter: Payer: Self-pay | Admitting: Internal Medicine

## 2017-06-03 DIAGNOSIS — F4321 Adjustment disorder with depressed mood: Secondary | ICD-10-CM

## 2017-06-03 DIAGNOSIS — F3130 Bipolar disorder, current episode depressed, mild or moderate severity, unspecified: Secondary | ICD-10-CM

## 2017-06-03 DIAGNOSIS — R739 Hyperglycemia, unspecified: Secondary | ICD-10-CM

## 2017-06-03 DIAGNOSIS — F319 Bipolar disorder, unspecified: Secondary | ICD-10-CM

## 2017-06-03 NOTE — Assessment & Plan Note (Signed)
Monitoring

## 2017-06-03 NOTE — Assessment & Plan Note (Signed)
Dr Loreli Slot at [facility] - Lithium He will re-start Zyprexa

## 2017-06-03 NOTE — Progress Notes (Signed)
Subjective:  Patient ID: Adam Keller, male    DOB: March 02, 1956  Age: 61 y.o. MRN: [id number]  CC: No chief complaint on file.   HPI Ignatius Kloos Chelf presents for hypothyroidism, depression, grief f/u. Coping ok...  Outpatient Medications Prior to Visit  Medication Sig Dispense Refill  . amantadine (SYMMETREL) 100 MG capsule Take 100 mg by mouth daily.      Marland Kitchen aspirin 81 MG EC tablet Take 1 tablet (81 mg total) by mouth 2 (two) times daily. 100 tablet 5  . Cholecalciferol 1000 UNITS tablet Take 1,000 Units by mouth daily.      . colesevelam (WELCHOL) 625 MG tablet Take 625-1,250 mg by mouth as needed. For diarrhea     . dutasteride (AVODART) 0.5 MG capsule Take 0.5 mg by mouth every other day.      . fish oil-omega-3 fatty acids 1000 MG capsule Take 1 g by mouth daily.      Marland Kitchen lamoTRIgine (LAMICTAL) 200 MG tablet Take 200 mg by mouth at bedtime.      Marland Kitchen levothyroxine (SYNTHROID) 50 MCG tablet Take 1 tablet (50 mcg total) by mouth daily before breakfast. 90 tablet 3  . lithium carbonate 300 MG capsule Take by mouth.    . Lutein 20 MG TABS Take 1 tablet by mouth daily. Reported on 11/29/2015    . mometasone (NASONEX) 50 MCG/ACT nasal spray Place 2 sprays into the nose daily. 51 g 3  . montelukast (SINGULAIR) 10 MG tablet Take 1 tablet (10 mg total) by mouth daily. Yearly physical due in May must see MD for future refills 90 tablet 3  . Multiple Vitamins-Minerals (MULTIVITAMIN,TX-MINERALS) tablet Take 1 tablet by mouth daily.      Marland Kitchen OLANZapine (ZYPREXA) 10 MG tablet Take by mouth.    Marland Kitchen omeprazole (PRILOSEC) 40 MG capsule Take 40 mg by mouth daily as needed.     . Selenium 200 MCG CAPS Take 1 capsule by mouth daily. Reported on 11/29/2015    . zaleplon (SONATA) 5 MG capsule Take 5 mg by mouth at bedtime.       No facility-administered medications prior to visit.     ROS Review of Systems  Constitutional: Negative for appetite change, fatigue and unexpected weight change.  HENT: Negative for  congestion, nosebleeds, sneezing, sore throat and trouble swallowing.   Eyes: Negative for itching and visual disturbance.  Respiratory: Negative for cough.   Cardiovascular: Negative for chest pain, palpitations and leg swelling.  Gastrointestinal: Negative for abdominal distention, blood in stool, diarrhea and nausea.  Genitourinary: Negative for frequency and hematuria.  Musculoskeletal: Negative for back pain, gait problem, joint swelling and neck pain.  Skin: Negative for rash.  Neurological: Negative for dizziness, tremors, speech difficulty and weakness.  Psychiatric/Behavioral: Positive for sleep disturbance. Negative for agitation, behavioral problems, confusion, dysphoric mood and suicidal ideas. The patient is nervous/anxious.     Objective:  BP 122/78 (BP Location: Left Arm, Patient Position: Sitting, Cuff Size: Normal)   Pulse 65   Temp 98 F (36.7 C) (Oral)   Ht 6' (1.829 m)   Wt 162 lb (73.5 kg)   SpO2 98%   BMI 21.97 kg/m   BP Readings from Last 3 Encounters:  06/03/17 122/78  03/03/17 128/80  01/17/17 132/80    Wt Readings from Last 3 Encounters:  06/03/17 162 lb (73.5 kg)  03/03/17 162 lb (73.5 kg)  01/16/17 150 lb (68 kg)    Physical Exam  Constitutional: He is oriented  to person, place, and time. He appears well-developed. No distress.  NAD  HENT:  Mouth/Throat: Oropharynx is clear and moist.  Eyes: Conjunctivae are normal. Pupils are equal, round, and reactive to light.  Neck: Normal range of motion. No JVD present. No thyromegaly present.  Cardiovascular: Normal rate, regular rhythm, normal heart sounds and intact distal pulses. Exam reveals no gallop and no friction rub.  No murmur heard. Pulmonary/Chest: Effort normal and breath sounds normal. No respiratory distress. He has no wheezes. He has no rales. He exhibits no tenderness.  Abdominal: Soft. Bowel sounds are normal. He exhibits no distension and no mass. There is no tenderness. There is no  rebound and no guarding.  Musculoskeletal: Normal range of motion. He exhibits no edema or tenderness.  Lymphadenopathy:    He has no cervical adenopathy.  Neurological: He is alert and oriented to person, place, and time. He has normal reflexes. No cranial nerve deficit. He exhibits normal muscle tone. He displays a negative Romberg sign. Coordination and gait normal.  Skin: Skin is warm and dry. No rash noted.  Psychiatric: He has a normal mood and affect. His behavior is normal. Judgment and thought content normal.  hoarse  Lab Results  Component Value Date   WBC 7.2 01/16/2017   HGB 16.6 01/16/2017   HCT 46.9 01/16/2017   PLT 228 01/16/2017   GLUCOSE 108 (H) 03/03/2017   CHOL 170 12/01/2016   TRIG 47.0 12/01/2016   HDL 58.70 12/01/2016   LDLDIRECT 116.5 01/02/2011   LDLCALC 102 (H) 12/01/2016   ALT 10 03/03/2017   AST 21 03/03/2017   NA 138 03/03/2017   K 4.3 03/03/2017   CL 103 03/03/2017   CREATININE 1.09 03/03/2017   BUN 17 03/03/2017   CO2 29 03/03/2017   TSH 1.40 12/01/2016   PSA 5.20 (H) 05/08/2014   HGBA1C 5.1 03/03/2017    No results found.  Assessment & Plan:   There are no diagnoses linked to this encounter. I am having Hilton Cork Pebley maintain his amantadine, dutasteride, lamoTRIgine, (multivitamin,tx-minerals), Cholecalciferol, colesevelam, zaleplon, Selenium, fish oil-omega-3 fatty acids, Lutein, omeprazole, aspirin, levothyroxine, mometasone, montelukast, lithium carbonate, and OLANZapine.  No orders of the defined types were placed in this encounter.    Follow-up: No Follow-up on file.  Walker Kehr, MD

## 2017-06-03 NOTE — Assessment & Plan Note (Signed)
Discussed.

## 2017-06-08 ENCOUNTER — Ambulatory Visit (INDEPENDENT_AMBULATORY_CARE_PROVIDER_SITE_OTHER): Payer: Federal, State, Local not specified - PPO | Admitting: *Deleted

## 2017-06-08 DIAGNOSIS — Z23 Encounter for immunization: Secondary | ICD-10-CM | POA: Diagnosis not present

## 2017-06-23 DIAGNOSIS — J019 Acute sinusitis, unspecified: Secondary | ICD-10-CM | POA: Diagnosis not present

## 2017-08-12 DIAGNOSIS — K148 Other diseases of tongue: Secondary | ICD-10-CM | POA: Diagnosis not present

## 2017-08-21 DIAGNOSIS — F3174 Bipolar disorder, in full remission, most recent episode manic: Secondary | ICD-10-CM | POA: Diagnosis not present

## 2017-08-27 DIAGNOSIS — K080 Exfoliation of teeth due to systemic causes: Secondary | ICD-10-CM | POA: Diagnosis not present

## 2017-10-01 ENCOUNTER — Ambulatory Visit: Payer: Federal, State, Local not specified - PPO | Admitting: Internal Medicine

## 2017-10-01 ENCOUNTER — Encounter: Payer: Self-pay | Admitting: Internal Medicine

## 2017-10-01 ENCOUNTER — Other Ambulatory Visit (INDEPENDENT_AMBULATORY_CARE_PROVIDER_SITE_OTHER): Payer: Federal, State, Local not specified - PPO

## 2017-10-01 DIAGNOSIS — F3130 Bipolar disorder, current episode depressed, mild or moderate severity, unspecified: Secondary | ICD-10-CM

## 2017-10-01 DIAGNOSIS — F319 Bipolar disorder, unspecified: Secondary | ICD-10-CM

## 2017-10-01 DIAGNOSIS — M25511 Pain in right shoulder: Secondary | ICD-10-CM | POA: Diagnosis not present

## 2017-10-01 DIAGNOSIS — R55 Syncope and collapse: Secondary | ICD-10-CM | POA: Diagnosis not present

## 2017-10-01 LAB — TSH: TSH: 11.49 u[IU]/mL — AB (ref 0.35–4.50)

## 2017-10-01 LAB — BASIC METABOLIC PANEL
BUN: 13 mg/dL (ref 6–23)
CO2: 25 mEq/L (ref 19–32)
Calcium: 10.3 mg/dL (ref 8.4–10.5)
Chloride: 107 mEq/L (ref 96–112)
Creatinine, Ser: 0.99 mg/dL (ref 0.40–1.50)
GFR: 81.83 mL/min (ref >60.00)
Glucose, Bld: 126 mg/dL — ABNORMAL HIGH (ref 70–99)
POTASSIUM: 4.3 meq/L (ref 3.5–5.1)
Sodium: 140 mEq/L (ref 135–145)

## 2017-10-01 LAB — HEPATIC FUNCTION PANEL
ALBUMIN: 5 g/dL (ref 3.5–5.2)
ALT: 10 U/L (ref 0–53)
AST: 18 U/L (ref 0–37)
Alkaline Phosphatase: 61 U/L (ref 39–117)
Bilirubin, Direct: 0.1 mg/dL (ref 0.0–0.3)
TOTAL PROTEIN: 6.7 g/dL (ref 6.0–8.3)
Total Bilirubin: 0.4 mg/dL (ref 0.2–1.2)

## 2017-10-01 MED ORDER — MELOXICAM 7.5 MG PO TABS: 7.5000 mg | ORAL_TABLET | Freq: Every day | ORAL | 0 refills | Status: DC | PRN

## 2017-10-01 MED ORDER — OMEPRAZOLE 40 MG PO CPDR: 40.0000 mg | DELAYED_RELEASE_CAPSULE | Freq: Every day | ORAL | 2 refills | Status: DC | PRN

## 2017-10-01 MED ORDER — LEVOTHYROXINE SODIUM 50 MCG PO TABS: 75.0000 ug | ORAL_TABLET | Freq: Every day | ORAL | 3 refills | Status: DC

## 2017-10-01 NOTE — Assessment & Plan Note (Signed)
Nilo felt lightheaded for a moment and passed out briefly after turning pale in a chair, SBP 86 - recovered fast. No CP. H/o vaso-vagal episode w/negative w/up EKG Coffee BP normalized

## 2017-10-01 NOTE — Patient Instructions (Signed)
Rotator Cuff Tendinitis Rotator cuff tendinitis is inflammation of the tough, cord-like bands that connect muscle to bone (tendons) in the rotator cuff. The rotator cuff includes all of the muscles and tendons that connect the arm to the shoulder. The rotator cuff holds the head of the upper arm bone (humerus) in the cup (fossa) of the shoulder blade (scapula). This condition can lead to a long-lasting (chronic) tear. The tear may be partial or complete. What are the causes? This condition is usually caused by overusing the rotator cuff. What increases the risk? This condition is more likely to develop in athletes and workers who frequently use their shoulder or reach over their heads. This can include activities such as:  Tennis.  Baseball or softball.  Swimming.  Construction work.  Painting.  What are the signs or symptoms? Symptoms of this condition include:  Pain spreading (radiating) from the shoulder to the upper arm.  Swelling and tenderness in front of the shoulder.  Pain when reaching, pulling, or lifting the arm above the head.  Pain when lowering the arm from above the head.  Minor pain in the shoulder when resting.  Increased pain in the shoulder at night.  Difficulty placing the arm behind the back.  How is this diagnosed? This condition is diagnosed with a medical history and physical exam. Tests may also be done, including:  X-rays.  MRI.  Ultrasounds.  CT or MR arthrogram. During this test, a contrast material is injected and then images are taken.  How is this treated? Treatment for this condition depends on the severity of the condition. In less severe cases, treatment may include:  Rest. This may be done with a sling that holds the shoulder still (immobilization). Your health care provider may also recommend avoiding activities that involve lifting your arm over your head.  Icing the shoulder.  Anti-inflammatory medicines, such as aspirin or  ibuprofen.  In more severe cases, treatment may include:  Physical therapy.  Steroid injections.  Surgery.  Follow these instructions at home: If you have a sling:  Wear the sling as told by your health care provider. Remove it only as told by your health care provider.  Loosen the sling if your fingers tingle, become numb, or turn cold and blue.  Keep the sling clean.  If the sling is not waterproof, do not let it get wet. Remove it, if allowed, or cover it with a watertight covering when you take a bath or shower. Managing pain, stiffness, and swelling  If directed, put ice on the injured area. ? If you have a removable sling, remove it as told by your health care provider. ? Put ice in a plastic bag. ? Place a towel between your skin and the bag. ? Leave the ice on for 20 minutes, 2-3 times a day.  Move your fingers often to avoid stiffness and to lessen swelling.  Raise (elevate) the injured area above the level of your heart while you are lying down.  Find a comfortable sleeping position or sleep on a recliner, if available. Driving  Do not drive or use heavy machinery while taking prescription pain medicine.  Ask your health care provider when it is safe to drive if you have a sling on your arm. Activity  Rest your shoulder as told by your health care provider.  Return to your normal activities as told by your health care provider. Ask your health care provider what activities are safe for you.  Do any   exercises or stretches as told by your health care provider.  If you do repetitive overhead tasks, take small breaks in between and include stretching exercises as told by your health care provider. General instructions  Do not use any products that contain nicotine or tobacco, such as cigarettes and e-cigarettes. These can delay healing. If you need help quitting, ask your health care provider.  Take over-the-counter and prescription medicines only as told by  your health care provider.  Keep all follow-up visits as told by your health care provider. This is important. Contact a health care provider if:  Your pain gets worse.  You have new pain in your arm, hands, or fingers.  Your pain is not relieved with medicine or does not get better after 6 weeks of treatment.  You have cracking sensations when moving your shoulder in certain directions.  You hear a snapping sound after using your shoulder, followed by severe pain and weakness. Get help right away if:  Your arm, hand, or fingers are numb or tingling.  Your arm, hand, or fingers are swollen or painful or they turn white or blue. Summary  Rotator cuff tendinitis is inflammation of the tough, cord-like bands that connect muscle to bone (tendons) in the rotator cuff.  This condition is usually caused by overusing the rotator cuff, which includes all of the muscles and tendons that connect the arm to the shoulder.  This condition is more likely to develop in athletes and workers who frequently use their shoulder or reach over their heads.  Treatment generally includes rest, anti-inflammatory medicines, and icing. In some cases, physical therapy and steroid injections may be needed. In severe cases, surgery may be needed. This information is not intended to replace advice given to you by your health care provider. Make sure you discuss any questions you have with your health care provider. Document Released: 09/20/2003 Document Revised: 06/16/2016 Document Reviewed: 06/16/2016 Elsevier Interactive Patient Education  2017 [employer]  

## 2017-10-01 NOTE — Assessment & Plan Note (Signed)
Rx

## 2017-10-01 NOTE — Assessment & Plan Note (Signed)
Stretch NSAIDs Sports Med if not better

## 2017-10-01 NOTE — Assessment & Plan Note (Signed)
10/01/17

## 2017-10-01 NOTE — Progress Notes (Signed)
Subjective:  Patient ID: Adam Keller, male    DOB: 1956/01/25  Age: 62 y.o. MRN: [id number]  CC: No chief complaint on file.   HPI Adam Keller presents for R shoulder pain x 3 weeks after he started to loose wt F/u hypothyroidism, depression, grief  [name] felt lightheaded for a moment and passed out briefly after turning pale in a chair, SBP 86 - recovered fast. No CP. H/o vaso-vagal episode w/negative w/up.  He was let go after.  Observation when his condition return back to normal.  Normal blood pressure and physical exam in the end of the visit.   Observed >45 min   Outpatient Medications Prior to Visit  Medication Sig Dispense Refill  . amantadine (SYMMETREL) 100 MG capsule Take 100 mg by mouth daily.      Marland Kitchen aspirin 81 MG EC tablet Take 1 tablet (81 mg total) by mouth 2 (two) times daily. 100 tablet 5  . Cholecalciferol 1000 UNITS tablet Take 1,000 Units by mouth daily.      . colesevelam (WELCHOL) 625 MG tablet Take 625-1,250 mg by mouth as needed. For diarrhea     . dutasteride (AVODART) 0.5 MG capsule Take 0.5 mg by mouth every other day.      . fish oil-omega-3 fatty acids 1000 MG capsule Take 1 g by mouth daily.      Marland Kitchen lamoTRIgine (LAMICTAL) 200 MG tablet Take 200 mg by mouth at bedtime.      Marland Kitchen levothyroxine (SYNTHROID) 50 MCG tablet Take 1 tablet (50 mcg total) by mouth daily before breakfast. 90 tablet 3  . lithium carbonate 300 MG capsule Take by mouth.    . Lutein 20 MG TABS Take 1 tablet by mouth daily. Reported on 11/29/2015    . mometasone (NASONEX) 50 MCG/ACT nasal spray Place 2 sprays into the nose daily. 51 g 3  . montelukast (SINGULAIR) 10 MG tablet Take 1 tablet (10 mg total) by mouth daily. Yearly physical due in May must see MD for future refills 90 tablet 3  . Multiple Vitamins-Minerals (MULTIVITAMIN,TX-MINERALS) tablet Take 1 tablet by mouth daily.      Marland Kitchen OLANZapine (ZYPREXA) 10 MG tablet Take by mouth.    Marland Kitchen omeprazole (PRILOSEC) 40 MG capsule Take 40 mg  by mouth daily as needed.     . Selenium 200 MCG CAPS Take 1 capsule by mouth daily. Reported on 11/29/2015    . zaleplon (SONATA) 5 MG capsule Take 5 mg by mouth at bedtime.       No facility-administered medications prior to visit.     ROS Review of Systems  Constitutional: Negative for appetite change, fatigue and unexpected weight change.  HENT: Negative for congestion, nosebleeds, sneezing, sore throat and trouble swallowing.   Eyes: Negative for itching and visual disturbance.  Respiratory: Negative for cough.   Cardiovascular: Negative for chest pain, palpitations and leg swelling.  Gastrointestinal: Negative for abdominal distention, blood in stool, diarrhea and nausea.  Genitourinary: Negative for frequency and hematuria.  Musculoskeletal: Positive for arthralgias. Negative for back pain, gait problem, joint swelling and neck pain.  Skin: Negative for rash.  Neurological: Negative for dizziness, tremors, speech difficulty and weakness.  Psychiatric/Behavioral: Positive for dysphoric mood. Negative for agitation, sleep disturbance and suicidal ideas. The patient is not nervous/anxious.     Objective:  BP 126/82 (BP Location: Left Arm, Patient Position: Sitting, Cuff Size: Normal)   Pulse 69   Temp 97.9 F (36.6 C) (Oral)  Ht 6' (1.829 m)   Wt 162 lb (73.5 kg)   SpO2 98%   BMI 21.97 kg/m   BP Readings from Last 3 Encounters:  10/01/17 126/82  06/03/17 122/78  03/03/17 128/80    Wt Readings from Last 3 Encounters:  10/01/17 162 lb (73.5 kg)  06/03/17 162 lb (73.5 kg)  03/03/17 162 lb (73.5 kg)    Physical Exam  Constitutional: He is oriented to person, place, and time. He appears well-developed. No distress.  NAD  HENT:  Mouth/Throat: Oropharynx is clear and moist.  Eyes: Pupils are equal, round, and reactive to light. Conjunctivae are normal.  Neck: Normal range of motion. No JVD present. No thyromegaly present.  Cardiovascular: Normal rate, regular rhythm,  normal heart sounds and intact distal pulses. Exam reveals no gallop and no friction rub.  No murmur heard. Pulmonary/Chest: Effort normal and breath sounds normal. No respiratory distress. He has no wheezes. He has no rales. He exhibits no tenderness.  Abdominal: Soft. Bowel sounds are normal. He exhibits no distension and no mass. There is no tenderness. There is no rebound and no guarding.  Musculoskeletal: Normal range of motion. He exhibits tenderness. He exhibits no edema.  Lymphadenopathy:    He has no cervical adenopathy.  Neurological: He is alert and oriented to person, place, and time. He has normal reflexes. No cranial nerve deficit. He exhibits normal muscle tone. He displays a negative Romberg sign. Coordination and gait normal.  Skin: Skin is warm and dry. No rash noted.  Psychiatric: His behavior is normal. Judgment and thought content normal.   Procedure: EKG Indication: vaso-vagal today Impression: NSR. No acute changes.  Lab Results  Component Value Date   WBC 7.2 01/16/2017   HGB 16.6 01/16/2017   HCT 46.9 01/16/2017   PLT 228 01/16/2017   GLUCOSE 108 (H) 03/03/2017   CHOL 170 12/01/2016   TRIG 47.0 12/01/2016   HDL 58.70 12/01/2016   LDLDIRECT 116.5 01/02/2011   LDLCALC 102 (H) 12/01/2016   ALT 10 03/03/2017   AST 21 03/03/2017   NA 138 03/03/2017   K 4.3 03/03/2017   CL 103 03/03/2017   CREATININE 1.09 03/03/2017   BUN 17 03/03/2017   CO2 29 03/03/2017   TSH 1.40 12/01/2016   PSA 5.20 (H) 05/08/2014   HGBA1C 5.1 03/03/2017    No results found.  Assessment & Plan:      Follow-up: No follow-ups on file.  Walker Kehr, MD

## 2017-10-12 ENCOUNTER — Encounter: Payer: Self-pay | Admitting: Internal Medicine

## 2017-10-12 MED ORDER — LEVOTHYROXINE SODIUM 50 MCG PO TABS: 75.0000 ug | ORAL_TABLET | Freq: Every day | ORAL | 3 refills | Status: DC

## 2017-11-18 DIAGNOSIS — R972 Elevated prostate specific antigen [PSA]: Secondary | ICD-10-CM | POA: Diagnosis not present

## 2017-11-20 DIAGNOSIS — F3174 Bipolar disorder, in full remission, most recent episode manic: Secondary | ICD-10-CM | POA: Diagnosis not present

## 2017-11-25 DIAGNOSIS — N400 Enlarged prostate without lower urinary tract symptoms: Secondary | ICD-10-CM | POA: Diagnosis not present

## 2017-11-25 DIAGNOSIS — R972 Elevated prostate specific antigen [PSA]: Secondary | ICD-10-CM | POA: Diagnosis not present

## 2017-12-02 ENCOUNTER — Encounter: Payer: Self-pay | Admitting: Emergency Medicine

## 2017-12-02 ENCOUNTER — Inpatient Hospital Stay
Admission: AD | Admit: 2017-12-02 | Discharge: 2017-12-14 | DRG: 885 | Disposition: A | Discharge status: Home / Self Care | Payer: Federal, State, Local not specified - PPO | Source: Intra-hospital | Attending: Psychiatry | Admitting: Psychiatry

## 2017-12-02 ENCOUNTER — Emergency Department
Admission: EM | Admit: 2017-12-02 | Discharge: 2017-12-02 | Disposition: A | Discharge status: Other Acute Inpatient Hospital | Payer: Federal, State, Local not specified - PPO | Attending: Emergency Medicine | Admitting: Emergency Medicine

## 2017-12-02 ENCOUNTER — Other Ambulatory Visit: Payer: Self-pay

## 2017-12-02 DIAGNOSIS — F911 Conduct disorder, childhood-onset type: Secondary | ICD-10-CM | POA: Diagnosis not present

## 2017-12-02 DIAGNOSIS — Z881 Allergy status to other antibiotic agents status: Secondary | ICD-10-CM | POA: Diagnosis not present

## 2017-12-02 DIAGNOSIS — N400 Enlarged prostate without lower urinary tract symptoms: Secondary | ICD-10-CM | POA: Diagnosis not present

## 2017-12-02 DIAGNOSIS — R45850 Homicidal ideations: Secondary | ICD-10-CM | POA: Diagnosis present

## 2017-12-02 DIAGNOSIS — F312 Bipolar disorder, current episode manic severe with psychotic features: Principal | ICD-10-CM | POA: Diagnosis present

## 2017-12-02 DIAGNOSIS — J309 Allergic rhinitis, unspecified: Secondary | ICD-10-CM | POA: Diagnosis present

## 2017-12-02 DIAGNOSIS — J301 Allergic rhinitis due to pollen: Secondary | ICD-10-CM | POA: Diagnosis present

## 2017-12-02 DIAGNOSIS — K219 Gastro-esophageal reflux disease without esophagitis: Secondary | ICD-10-CM | POA: Diagnosis present

## 2017-12-02 DIAGNOSIS — G4700 Insomnia, unspecified: Secondary | ICD-10-CM | POA: Diagnosis not present

## 2017-12-02 DIAGNOSIS — E039 Hypothyroidism, unspecified: Secondary | ICD-10-CM | POA: Diagnosis present

## 2017-12-02 DIAGNOSIS — R45851 Suicidal ideations: Secondary | ICD-10-CM | POA: Diagnosis present

## 2017-12-02 DIAGNOSIS — E785 Hyperlipidemia, unspecified: Secondary | ICD-10-CM | POA: Diagnosis not present

## 2017-12-02 DIAGNOSIS — Z9114 Patient's other noncompliance with medication regimen: Secondary | ICD-10-CM

## 2017-12-02 DIAGNOSIS — Z818 Family history of other mental and behavioral disorders: Secondary | ICD-10-CM | POA: Diagnosis not present

## 2017-12-02 DIAGNOSIS — F319 Bipolar disorder, unspecified: Secondary | ICD-10-CM | POA: Diagnosis not present

## 2017-12-02 DIAGNOSIS — Z79899 Other long term (current) drug therapy: Secondary | ICD-10-CM

## 2017-12-02 DIAGNOSIS — Z7982 Long term (current) use of aspirin: Secondary | ICD-10-CM | POA: Diagnosis not present

## 2017-12-02 DIAGNOSIS — F308 Other manic episodes: Secondary | ICD-10-CM | POA: Diagnosis not present

## 2017-12-02 DIAGNOSIS — F3164 Bipolar disorder, current episode mixed, severe, with psychotic features: Secondary | ICD-10-CM | POA: Diagnosis present

## 2017-12-02 DIAGNOSIS — Z888 Allergy status to other drugs, medicaments and biological substances status: Secondary | ICD-10-CM

## 2017-12-02 DIAGNOSIS — F17290 Nicotine dependence, other tobacco product, uncomplicated: Secondary | ICD-10-CM | POA: Diagnosis not present

## 2017-12-02 LAB — COMPREHENSIVE METABOLIC PANEL
ALBUMIN: 5.3 g/dL — AB (ref 3.5–5.0)
ALT: 14 U/L — AB (ref 17–63)
AST: 39 U/L (ref 15–41)
Alkaline Phosphatase: 65 U/L (ref 38–126)
Anion gap: 10 (ref 5–15)
BUN: 21 mg/dL — AB (ref 6–20)
CHLORIDE: 106 mmol/L (ref 101–111)
CO2: 22 mmol/L (ref 22–32)
Calcium: 10 mg/dL (ref 8.9–10.3)
Creatinine, Ser: 0.87 mg/dL (ref 0.61–1.24)
GFR calc Af Amer: >60 mL/min (ref >60)
GFR calc non Af Amer: >60 mL/min (ref >60)
GLUCOSE: 118 mg/dL — AB (ref 65–99)
Potassium: 3.8 mmol/L (ref 3.5–5.1)
SODIUM: 138 mmol/L (ref 135–145)
Total Bilirubin: 1.2 mg/dL (ref 0.3–1.2)
Total Protein: 7.3 g/dL (ref 6.5–8.1)

## 2017-12-02 LAB — LITHIUM LEVEL: Lithium Lvl: <0.06 mmol/L — ABNORMAL LOW (ref 0.60–1.20)

## 2017-12-02 LAB — URINE DRUG SCREEN, QUALITATIVE (ARMC ONLY)
Amphetamines, Ur Screen: NOT DETECTED
BARBITURATES, UR SCREEN: NOT DETECTED
BENZODIAZEPINE, UR SCRN: NOT DETECTED
Cannabinoid 50 Ng, Ur ~~LOC~~: NOT DETECTED
Cocaine Metabolite,Ur ~~LOC~~: NOT DETECTED
MDMA (Ecstasy)Ur Screen: NOT DETECTED
Methadone Scn, Ur: NOT DETECTED
OPIATE, UR SCREEN: NOT DETECTED
PHENCYCLIDINE (PCP) UR S: NOT DETECTED
Tricyclic, Ur Screen: NOT DETECTED

## 2017-12-02 LAB — CBC
HEMATOCRIT: 45.9 % (ref 40.0–52.0)
Hemoglobin: 16 g/dL (ref 13.0–18.0)
MCH: 33.4 pg (ref 26.0–34.0)
MCHC: 34.8 g/dL (ref 32.0–36.0)
MCV: 96.2 fL (ref 80.0–100.0)
PLATELETS: 234 10*3/uL (ref 150–440)
RBC: 4.77 MIL/uL (ref 4.40–5.90)
RDW: 12.4 % (ref 11.5–14.5)
WBC: 8.3 10*3/uL (ref 3.8–10.6)

## 2017-12-02 LAB — ACETAMINOPHEN LEVEL

## 2017-12-02 LAB — ETHANOL: Alcohol, Ethyl (B): <10 mg/dL (ref <10)

## 2017-12-02 LAB — SALICYLATE LEVEL: Salicylate Lvl: <7 mg/dL (ref 2.8–30.0)

## 2017-12-02 MED ORDER — LEVOTHYROXINE SODIUM 50 MCG PO TABS
50.0000 ug | ORAL_TABLET | Freq: Every day | ORAL | Status: DC
  Administered 2017-12-03 – 2017-12-10 (×8): 50 ug via ORAL
  Filled 2017-12-02 (×8): qty 1

## 2017-12-02 MED ORDER — OLANZAPINE 10 MG PO TABS: 10.0000 mg | ORAL_TABLET | Freq: Every day | ORAL | Status: DC

## 2017-12-02 MED ORDER — OCUVITE-LUTEIN PO CAPS
1.0000 | ORAL_CAPSULE | Freq: Every day | ORAL | Status: DC
  Administered 2017-12-03 – 2017-12-14 (×12): 1 via ORAL
  Filled 2017-12-02 (×13): qty 1

## 2017-12-02 MED ORDER — MONTELUKAST SODIUM 10 MG PO TABS
10.0000 mg | ORAL_TABLET | Freq: Every day | ORAL | Status: DC
  Administered 2017-12-03 – 2017-12-13 (×11): 10 mg via ORAL
  Filled 2017-12-02 (×11): qty 1

## 2017-12-02 MED ORDER — OLANZAPINE 10 MG PO TABS
10.0000 mg | ORAL_TABLET | Freq: Once | ORAL | Status: AC
  Administered 2017-12-02: 10 mg via ORAL
  Filled 2017-12-02: qty 1

## 2017-12-02 MED ORDER — LAMOTRIGINE 100 MG PO TABS
200.0000 mg | ORAL_TABLET | Freq: Every day | ORAL | Status: DC
  Administered 2017-12-02: 200 mg via ORAL
  Filled 2017-12-02: qty 2

## 2017-12-02 MED ORDER — MAGNESIUM HYDROXIDE 400 MG/5ML PO SUSP: 30.0000 mL | Freq: Every day | ORAL | Status: DC | PRN

## 2017-12-02 MED ORDER — ALUM & MAG HYDROXIDE-SIMETH 200-200-20 MG/5ML PO SUSP: 30.0000 mL | ORAL | Status: DC | PRN

## 2017-12-02 MED ORDER — LEVOTHYROXINE SODIUM 50 MCG PO TABS: 50.0000 ug | ORAL_TABLET | Freq: Every day | ORAL | Status: DC

## 2017-12-02 MED ORDER — HYDROXYZINE HCL 50 MG PO TABS
50.0000 mg | ORAL_TABLET | Freq: Three times a day (TID) | ORAL | Status: DC | PRN
  Administered 2017-12-03 – 2017-12-06 (×3): 50 mg via ORAL
  Filled 2017-12-02 (×4): qty 1

## 2017-12-02 MED ORDER — LITHIUM CARBONATE ER 300 MG PO TBCR: 600.0000 mg | EXTENDED_RELEASE_TABLET | Freq: Every day | ORAL | Status: DC

## 2017-12-02 MED ORDER — COLESEVELAM HCL 625 MG PO TABS
625.0000 mg | ORAL_TABLET | Freq: Every day | ORAL | Status: DC
  Administered 2017-12-03 – 2017-12-14 (×12): 625 mg via ORAL
  Filled 2017-12-02 (×13): qty 1

## 2017-12-02 MED ORDER — ACETAMINOPHEN 325 MG PO TABS: 650.0000 mg | ORAL_TABLET | Freq: Four times a day (QID) | ORAL | Status: DC | PRN

## 2017-12-02 MED ORDER — LITHIUM CARBONATE ER 300 MG PO TBCR
600.0000 mg | EXTENDED_RELEASE_TABLET | Freq: Every day | ORAL | Status: DC
  Administered 2017-12-02: 600 mg via ORAL
  Filled 2017-12-02 (×2): qty 2

## 2017-12-02 MED ORDER — COLESEVELAM HCL 625 MG PO TABS
625.0000 mg | ORAL_TABLET | Freq: Every day | ORAL | Status: DC
  Filled 2017-12-02: qty 1

## 2017-12-02 MED ORDER — TEMAZEPAM 15 MG PO CAPS
30.0000 mg | ORAL_CAPSULE | Freq: Every evening | ORAL | Status: DC | PRN
  Administered 2017-12-04 – 2017-12-06 (×3): 30 mg via ORAL
  Filled 2017-12-02 (×3): qty 2

## 2017-12-02 MED ORDER — LAMOTRIGINE 100 MG PO TABS
200.0000 mg | ORAL_TABLET | Freq: Every day | ORAL | Status: DC
  Administered 2017-12-03 – 2017-12-08 (×6): 200 mg via ORAL
  Filled 2017-12-02 (×6): qty 2

## 2017-12-02 MED ORDER — ASPIRIN EC 81 MG PO TBEC
81.0000 mg | DELAYED_RELEASE_TABLET | Freq: Every day | ORAL | Status: DC
  Administered 2017-12-02: 81 mg via ORAL
  Filled 2017-12-02: qty 1

## 2017-12-02 MED ORDER — OCUVITE-LUTEIN PO CAPS
1.0000 | ORAL_CAPSULE | Freq: Every day | ORAL | Status: DC
  Administered 2017-12-02: 1 via ORAL
  Filled 2017-12-02: qty 1

## 2017-12-02 MED ORDER — MONTELUKAST SODIUM 10 MG PO TABS
10.0000 mg | ORAL_TABLET | Freq: Every day | ORAL | Status: DC
  Administered 2017-12-02: 10 mg via ORAL
  Filled 2017-12-02 (×2): qty 1

## 2017-12-02 MED ORDER — ASPIRIN EC 81 MG PO TBEC
81.0000 mg | DELAYED_RELEASE_TABLET | Freq: Every day | ORAL | Status: DC
  Administered 2017-12-03 – 2017-12-08 (×6): 81 mg via ORAL
  Filled 2017-12-02 (×6): qty 1

## 2017-12-02 NOTE — ED Provider Notes (Addendum)
[facility] Emergency Department Provider Note  ____________________________________________   First MD Initiated Contact with Patient 12/02/17 1904     (approximate)  I have reviewed the triage vital signs and the nursing notes.   HISTORY  Chief Complaint IVC  Level 5 caveat:  history/ROS limited by active psychosis / mental illness / altered mental status   HPI Adam Keller is a 62 y.o. male with medical history as listed below which includes bipolar disorder who presents under involuntary commitment by law enforcement for bizarre behavior, hyperreligiosity, and concern for psychosis.  He has enough insight to say that he knows he has been saying crazy things but he believes he is Jesus and he believes that I am Jesus.  He then talks about multiple unrelated issues but he is redirectable back to himself.  He denies chest pain, shortness of breath, nausea, vomiting, abdominal pain.  His symptoms are severe and it sounds like they have been gradual in onset.  He reports being compliant with his lithium and all of his other medications.  He has no medical complaints.  Past Medical History:  Diagnosis Date  . Allergic rhinitis   . BPH (benign prostatic hyperplasia)    elev PSA resolved after abx 2008 Dr Diona Fanti (PSA 5.15 in 8/09)  . Depression    h/o bipolar Dr Letitia Neri at [facility]  . 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 Elevated glucose   . Headache(784.0)   . Horner's syndrome   . Hyperlipidemia   . Hypothyroidism     Patient Active Problem List   Diagnosis Date Noted  . Bipolar affective disorder, current episode manic with psychotic symptoms ([place]) 12/02/2017  . Syncope 10/01/2017  . Grief 12/01/2016  . Tongue lesion 12/01/2016  . Loss of weight 05/30/2016  . Well adult exam 05/08/2014  . Hyperglycemia 10/28/2012  . 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 Left facial pain  06/09/2011  . Lid lag 06/09/2011  . 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 PSA, INCREASED 07/10/2010  . ANKLE PAIN 04/08/2010  . Edema 04/08/2010  . Shoulder pain, right 03/06/2010  . Diarrhea 03/28/2009  . HOARSENESS 03/07/2009  . Hypothyroidism 08/10/2007  . Dyslipidemia 08/10/2007  . Bipolar depression ([place]) 08/10/2007  . Allergic rhinitis 08/10/2007  . BPH (benign prostatic hyperplasia) 08/10/2007    Past Surgical History:  Procedure Laterality Date  . PROSTATE BIOPSY    . Vocal Cord Surgery  2011   implants    Prior to Admission medications   Medication Sig Start Date End Date Taking? Authorizing Provider  amantadine (SYMMETREL) 100 MG capsule Take 100 mg by mouth daily.      [provider]  aspirin 81 MG EC tablet Take 1 tablet (81 mg total) by mouth 2 (two) times daily. 07/09/11   Plotnikov, Evie Lacks, MD  Cholecalciferol 1000 UNITS tablet Take 1,000 Units by mouth daily.      [provider]  colesevelam (WELCHOL) 625 MG tablet Take 625-1,250 mg by mouth as needed. For diarrhea     [provider]  dutasteride (AVODART) 0.5 MG capsule Take 0.5 mg by mouth every other day.      [provider]  fish oil-omega-3 fatty acids 1000 MG capsule Take 1 g by mouth daily.      [provider]  lamoTRIgine (LAMICTAL) 200 MG tablet Take 200 mg by mouth at bedtime.      [provider]  levothyroxine (SYNTHROID) 50 MCG tablet Take 1.5 tablets (75 mcg total) by mouth daily before breakfast. 10/12/17   Plotnikov, Evie Lacks, MD  lithium carbonate 300 MG capsule Take by mouth.    [provider]  Lutein 20 MG TABS Take 1 tablet by mouth daily. Reported on 11/29/2015    [provider]  meloxicam (MOBIC) 7.5 MG tablet Take 1 tablet (7.5 mg total) by mouth daily as needed for pain. 10/01/17   Plotnikov, Evie Lacks, MD  mometasone (NASONEX) 50 MCG/ACT nasal spray Place 2 sprays into the nose daily. 12/01/16   Plotnikov, Evie Lacks, MD    montelukast (SINGULAIR) 10 MG tablet Take 1 tablet (10 mg total) by mouth daily. Yearly physical due in May must see MD for future refills 12/01/16   Plotnikov, Evie Lacks, MD  Multiple Vitamins-Minerals (MULTIVITAMIN,TX-MINERALS) tablet Take 1 tablet by mouth daily.      [provider]  OLANZapine (ZYPREXA) 10 MG tablet Take by mouth. 02/20/17   [provider]  omeprazole (PRILOSEC) 40 MG capsule Take 1 capsule (40 mg total) by mouth daily as needed. 10/01/17   Plotnikov, Evie Lacks, MD  Selenium 200 MCG CAPS Take 1 capsule by mouth daily. Reported on 11/29/2015    [provider]  zaleplon (SONATA) 5 MG capsule Take 5 mg by mouth at bedtime.      [provider]    Allergies Fluoxetine hcl; Azithromycin; Cephalexin; Erythromycin; and Ketorolac tromethamine  Family History  Problem Relation Age of Onset  . Heart disease Mother        CHF  . Colon cancer Mother   . Diabetes Mother   . Hyperlipidemia Mother   . Heart disease Father        CHF  . Hypertension Other     Social History Social History   Tobacco Use  . Smoking status: Former Smoker    Types: Cigarettes, Cigars    Last attempt to quit: 07/28/2008    Years since quitting: 9.3  . Smokeless tobacco: Never Used  Substance Use Topics  . Alcohol use: No  . Drug use: No    Review of Systems Level 5 caveat:  history/ROS limited by active psychosis / mental illness / altered mental status  ____________________________________________   PHYSICAL EXAM:  VITAL SIGNS: ED Triage Vitals  Enc Vitals Group     BP 12/02/17 1805 131/89     Pulse Rate 12/02/17 1805 88     Resp 12/02/17 1805 16     Temp 12/02/17 1805 98.9 F (37.2 C)     Temp Source 12/02/17 1805 Oral     SpO2 12/02/17 1805 95 %     Weight 12/02/17 1806 68.9 kg (152 lb)     Height 12/02/17 1806 1.829 m (6')     Head Circumference --      Peak Flow --      Pain Score 12/02/17 1806 0     Pain Loc --      Pain Edu? --       Excl. in [place]? --     Constitutional: Alert and oriented.  Generally well-appearing and in no acute distress Eyes: Conjunctivae are normal.  Head: Atraumatic. Nose: No congestion/rhinnorhea. Mouth/Throat: Mucous membranes are moist. Neck: No stridor.  No meningeal signs.   Cardiovascular: Normal rate, regular rhythm. Good peripheral circulation. Grossly normal heart sounds. Respiratory: Normal respiratory effort.  No retractions. Lungs CTAB. Gastrointestinal: Soft and nontender. No distention.  Musculoskeletal: No lower extremity  tenderness nor edema. No gross deformities of extremities. Neurologic:  Normal speech and language. No gross focal neurologic deficits are appreciated.  Skin:  Skin is warm, dry and intact. No rash noted. Psychiatric: Hyper religious, generally calm and cooperative but once he is talking he becomes verbose and tangential.  No clear evidence of hallucinations for me.  Denies SI and HI but continues to express manic hyper religious ideas and thoughts.  ____________________________________________   LABS (all labs ordered are listed, but only abnormal results are displayed)  Labs Reviewed  COMPREHENSIVE METABOLIC PANEL - Abnormal; Notable for the following components:      Result Value   Glucose, Bld 118 (*)    BUN 21 (*)    Albumin 5.3 (*)    ALT 14 (*)    All other components within normal limits  ACETAMINOPHEN LEVEL - Abnormal; Notable for the following components:   Acetaminophen (Tylenol), Serum <10 (*)    All other components within normal limits  LITHIUM LEVEL - Abnormal; Notable for the following components:   Lithium Lvl <0.06 (*)    All other components within normal limits  ETHANOL  SALICYLATE LEVEL  CBC  URINE DRUG SCREEN, QUALITATIVE ([facility] ONLY)   ____________________________________________  EKG  ED ECG REPORT I, Hinda Kehr, the attending physician, personally viewed and interpreted this ECG.  Date: 12/02/2017 EKG Time: 20:  59 Rate: 71 Rhythm: normal sinus rhythm QRS Axis: normal Intervals: normal ST/T Wave abnormalities: Non-specific ST segment / T-wave changes, but no evidence of acute ischemia. Narrative Interpretation: no evidence of acute ischemia   ____________________________________________  RADIOLOGY   ED MD interpretation: No indication for imaging  Official radiology report(s): No results found.  ____________________________________________   PROCEDURES  Critical Care performed: No   Procedure(s) performed:   Procedures   ____________________________________________   INITIAL IMPRESSION / ASSESSMENT AND PLAN / ED COURSE  As part of my medical decision making, I reviewed the following data within the [facility] notes reviewed and incorporated, Labs reviewed  and A consult was requested and obtained from this/these consultant(s) Psychiatry    Differential diagnosis includes, but is not limited to, bipolar disorder with mania, schizophrenia, substance abuse, substance-induced mood disorder.  The patient's symptoms seem most consistent with bipolar disorder with a current manic episode.  Dr. Weber Cooks will evaluate the patient and offer recommendations but he appears he would benefit from behavioral medicine admission.  The patient has no acute medical complaints or concerns at this time.  Lab work reveals no other acute abnormalities.  Lithium level is pending at this time.  Drug screen is pending.  Clinical Course as of Dec 02 2037  Wed Dec 02, 2017  2024 Discussed case in person with Dr. Weber Cooks who will admit the patient downstairs to behavioral medicine as soon as a bed is available.  No acute medical issues to address at this time.   [CF]  2029 Lithium(!): <0.06 [CF]    Clinical Course User Index [CF] Hinda Kehr, MD    ____________________________________________  FINAL CLINICAL IMPRESSION(S) / ED DIAGNOSES  Final diagnoses:  Bipolar 1  disorder ([place])     MEDICATIONS GIVEN DURING THIS VISIT:  Medications  OLANZapine (ZYPREXA) tablet 10 mg (has no administration in time range)  OLANZapine (ZYPREXA) tablet 10 mg (has no administration in time range)  levothyroxine (SYNTHROID, LEVOTHROID) tablet 50 mcg (has no administration in time range)  colesevelam [facility]) tablet 625 mg (has no administration in time range)  aspirin EC  tablet 81 mg (has no administration in time range)  multivitamin-lutein (OCUVITE-LUTEIN) capsule 1 capsule (has no administration in time range)  montelukast (SINGULAIR) tablet 10 mg (has no administration in time range)  lithium carbonate (LITHOBID) CR tablet 600 mg (has no administration in time range)  lamoTRIgine (LAMICTAL) tablet 200 mg (has no administration in time range)     ED Discharge Orders    None       Note:  This document was prepared using Dragon voice recognition software and may include unintentional dictation errors.    Hinda Kehr, MD 12/02/17 2039    Hinda Kehr, MD 12/02/17 2101

## 2017-12-02 NOTE — Progress Notes (Signed)
New admit to the unit, IVC by the police for his walking around the neighborhood brandishing a gun and telling people that he was going to send them to heaven or hell , police found firearms in his house, patient preach hyper religiosity, telling the ER doctor that he is god, upon further assessment patient display some bizarre behaviors denying his name off the wall comments about everything he knows is in the computer, patient also Dx. With manic with psychotic symptoms  And Bipolar affective disorder. Body search and skin check is done by [employer]	[place] RN,  no contraband was found and skin is clean. Patient was calm and cooperative with assessments, educate patient on safety and nutritions acknowledged.

## 2017-12-02 NOTE — ED Triage Notes (Signed)
FIRST NURSE NOTE-with [place] [employer], IVC. Placed in chairs in triage area until ready for triage.

## 2017-12-02 NOTE — ED Notes (Signed)
Report Call to [facility]. Patient aware of transfer of care and is agreeable with plan.

## 2017-12-02 NOTE — ED Notes (Signed)
Pt taken with [place] to be changed and to be taken to room.

## 2017-12-02 NOTE — Tx Team (Signed)
Initial Treatment Plan 12/02/2017 10:31 PM [name] EBX:[id number]    PATIENT STRESSORS: Financial difficulties Health problems Medication change or noncompliance Occupational concerns   PATIENT STRENGTHS: Capable of independent living Curator fund of knowledge Motivation for treatment/growth   PATIENT IDENTIFIED PROBLEMS: Psychosis non violent     Non Compliant with his medicines     Depression/Anxiety    Suicidal Ideations         DISCHARGE CRITERIA:  Improved stabilization in mood, thinking, and/or behavior Medical problems require only outpatient monitoring Motivation to continue treatment in a less acute level of care Need for constant or close observation no longer present Safe-care adequate arrangements made  PRELIMINARY DISCHARGE PLAN: Attend 12-step recovery group Outpatient therapy Participate in family therapy Placement in alternative living arrangements  PATIENT/FAMILY INVOLVEMENT: This treatment plan has been presented to and reviewed with the patient, Adam Keller,   The patient  have been given the opportunity to ask questions and make suggestions.  Clemens Catholic, RN 12/02/2017, 10:31 PM

## 2017-12-02 NOTE — ED Triage Notes (Signed)
PT arrived with [place] [employer] with commitment papers. Pt denies SI/Hi in triage.

## 2017-12-02 NOTE — ED Notes (Signed)

## 2017-12-02 NOTE — BH Assessment (Signed)
Assessment Note  Adam Keller is an 62 y.o. male who presents to the ED via [employer] with IVC papers. Pt is manic with pressured speech and making hyper-religious statement in the ED. Pt is mourning the death of his wife who passed away one year ago this month. Pt denies SI/HI A/V H/D.  Diagnosis: Bipolar.   Past Medical History:  Past Medical History:  Diagnosis Date  . Allergic rhinitis   . BPH (benign prostatic hyperplasia)    elev PSA resolved after abx 2008 Dr Diona Fanti (PSA 5.15 in 8/09)  . Depression    h/o bipolar Dr Letitia Neri at [facility]  . 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 Elevated glucose   . Headache(784.0)   . Horner's syndrome   . Hyperlipidemia   . Hypothyroidism     Past Surgical History:  Procedure Laterality Date  . PROSTATE BIOPSY    . Vocal Cord Surgery  2011   implants    Family History:  Family History  Problem Relation Age of Onset  . Heart disease Mother        CHF  . Colon cancer Mother   . Diabetes Mother   . Hyperlipidemia Mother   . Heart disease Father        CHF  . Hypertension Other     Social History:  reports that he quit smoking about 9 years ago. His smoking use included cigarettes and cigars. He has never used smokeless tobacco. He reports that he does not drink alcohol or use drugs.  Additional Social History:  Alcohol / Drug Use Pain Medications: SEE MAR Prescriptions: SEE MAR Over the Counter: SEE MAR History of alcohol / drug use?: No history of alcohol / drug abuse  CIWA: CIWA-Ar BP: 131/89 Pulse Rate: 88 COWS:    Allergies:  Allergies  Allergen Reactions  . Fluoxetine Hcl Other (See Comments)    "severe mental reaction"  . Azithromycin Diarrhea  . Cephalexin Rash  . Erythromycin Nausea Only  . Ketorolac Tromethamine Nausea And Vomiting    Home Medications:  (Not in a hospital admission)  OB/GYN Status:  No LMP for male patient.  General Assessment Data Location of Assessment: [facility] ED TTS Assessment:  In system Is this a Tele or Face-to-Face Assessment?: Face-to-Face Is this an Initial Assessment or a Re-assessment for this encounter?: Initial Assessment Marital status: Widowed Living Arrangements: Alone Can pt return to current living arrangement?: Yes Admission Status: Involuntary Is patient capable of signing voluntary admission?: No Referral Source: Self/Family/Friend Insurance type: [place] Screening Exam ([place]) Medical Exam completed: Yes  Crisis Care Plan Living Arrangements: Alone Legal Guardian: Other:(Self) Name of Psychiatrist: Dr Sandi Mealy  Name of Therapist: n/a  Education Status Is patient currently in school?: No Is the patient employed, unemployed or receiving disability?: Employed  Risk to self with the past 6 months Suicidal Ideation: No Has patient been a risk to self within the past 6 months prior to admission? : No Suicidal Intent: No Has patient had any suicidal intent within the past 6 months prior to admission? : No Is patient at risk for suicide?: No Suicidal Plan?: No Has patient had any suicidal plan within the past 6 months prior to admission? : No Access to Means: No What has been your use of drugs/alcohol within the last 12 months?: Pt denies Previous Attempts/Gestures: No How many times?: 0 Other Self Harm Risks: n/a Triggers for Past Attempts: None known Intentional Self Injurious Behavior: None Family  Suicide History: No Recent stressful life event(s): Turmoil (Comment) Persecutory voices/beliefs?: No Depression: No Depression Symptoms: (denies) Substance abuse history and/or treatment for substance abuse?: No Suicide prevention information given to non-admitted patients: Not applicable  Risk to Others within the past 6 months Homicidal Ideation: No Does patient have any lifetime risk of violence toward others beyond the six months prior to admission? : No Thoughts of Harm to Others: No Current Homicidal Intent:  No Current Homicidal Plan: No Access to Homicidal Means: No Identified Victim: n/a History of harm to others?: No Assessment of Violence: None Noted Violent Behavior Description: denies Does patient have access to weapons?: Yes (Comment) Criminal Charges Pending?: No Does patient have a court date: No Is patient on probation?: No  Psychosis Hallucinations: None noted Delusions: None noted  Mental Status Report Appearance/Hygiene: Unremarkable, In scrubs Eye Contact: Good Motor Activity: Freedom of movement Speech: Pressured, Logical/coherent, Rapid Level of Consciousness: Alert Mood: Pleasant Affect: Appropriate to circumstance Anxiety Level: Minimal Thought Processes: Coherent Judgement: Partial Orientation: Place Obsessive Compulsive Thoughts/Behaviors: Moderate  Cognitive Functioning Concentration: Decreased Memory: Unable to Assess Is patient IDD: No Is patient DD?: No Insight: Poor Impulse Control: Poor Appetite: Good Have you had any weight changes? : No Change Sleep: Decreased Total Hours of Sleep: 4 Vegetative Symptoms: None  ADLScreening [facility]) Patient's cognitive ability adequate to safely complete daily activities?: Yes Patient able to express need for assistance with ADLs?: Yes Independently performs ADLs?: Yes (appropriate for developmental age)  Prior Inpatient Therapy Prior Inpatient Therapy: Yes Prior Therapy Dates: 2018 Prior Therapy Facilty/Provider(s): [facility] Reason for Treatment: Bipolar 1  Prior Outpatient Therapy Prior Outpatient Therapy: No Does patient have an ACCT team?: No Does patient have Intensive In-House Services?  : No Does patient have [facility] services? : No Does patient have P4CC services?: No  ADL Screening (condition at time of admission) Patient's cognitive ability adequate to safely complete daily activities?: Yes Is the patient deaf or have difficulty hearing?: No Does the patient have difficulty  seeing, even when wearing glasses/contacts?: No Does the patient have difficulty concentrating, remembering, or making decisions?: No Patient able to express need for assistance with ADLs?: Yes Does the patient have difficulty dressing or bathing?: No Independently performs ADLs?: Yes (appropriate for developmental age) Does the patient have difficulty walking or climbing stairs?: No Weakness of Legs: None Weakness of Arms/Hands: None  Home Assistive Devices/Equipment Home Assistive Devices/Equipment: None  Therapy Consults (therapy consults require a physician order) PT Evaluation Needed: No OT Evalulation Needed: No SLP Evaluation Needed: No Abuse/Neglect Assessment (Assessment to be complete while patient is alone) Abuse/Neglect Assessment Can Be Completed: Yes Physical Abuse: Denies Verbal Abuse: Denies Sexual Abuse: Denies Exploitation of patient/patient's resources: Denies Self-Neglect: Denies Values / Beliefs Cultural Requests During Hospitalization: None Spiritual Requests During Hospitalization: None Consults Spiritual Care Consult Needed: No Social Work Consult Needed: No      Additional Information 1:1 In Past 12 Months?: No CIRT Risk: No Elopement Risk: No Does patient have medical clearance?: Yes  Child/Adolescent Assessment Running Away Risk: (PT IS AN ADULT)  Disposition:  Disposition Initial Assessment Completed for this Encounter: Yes Disposition of Patient: Admit Type of inpatient treatment program: Adult Patient refused recommended treatment: No Mode of transportation if patient is discharged?: Car  On Site Evaluation by:   Reviewed with Physician:    Kylan Liberati D Rance Smithson 12/02/2017 8:25 PM

## 2017-12-02 NOTE — ED Notes (Signed)
PT IVC'D BY [place] PD/MD FORBACH, RN AMY AND ODS SECURITY MADE AWARE.

## 2017-12-02 NOTE — Consult Note (Signed)
[facility] Consult   Reason for Consult: Consult for 62 year old man with a history of bipolar disorder brought in by police under involuntary commitment Referring Physician: Karma Greaser Patient Identification: Adam Keller MRN:  [id number] Principal Diagnosis: Bipolar affective disorder, current episode manic with psychotic symptoms [facility]) Diagnosis:   Patient Active Problem List   Diagnosis Date Noted  . Bipolar affective disorder, current episode manic with psychotic symptoms ([place]) [F31.2] 12/02/2017  . Syncope [R55] 10/01/2017  . Grief [F43.21] 12/01/2016  . Tongue lesion [K14.8] 12/01/2016  . Loss of weight [R63.4] 05/30/2016  . Well adult exam [Z00.00] 05/08/2014  . Hyperglycemia [R73.9] 10/28/2012  . 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 Left facial pain [R51] 06/09/2011  . 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 PSA, INCREASED [R97.20] 07/10/2010  . ANKLE PAIN [M25.579] 04/08/2010  . Edema [R60.9] 04/08/2010  . Shoulder pain, right [M25.511] 03/06/2010  . Diarrhea [R19.7] 03/28/2009  . HOARSENESS [R49.8] 03/07/2009  . Hypothyroidism [E03.9] 08/10/2007  . Dyslipidemia [E78.5] 08/10/2007  . Bipolar depression ([place]) [F31.9] 08/10/2007  . Allergic rhinitis [J30.9] 08/10/2007  . BPH (benign prostatic hyperplasia) [N40.0] 08/10/2007    Total Time spent with patient: 1 hour  Subjective:   Adam Keller is a 62 y.o. male patient admitted with "I am going through a little manic spell".  HPI: Patient interviewed chart reviewed.  This 62 year old man was brought in by police under IVC.  The IVC report is rather dramatic.  It says that police were called by a neighbor who reported that the patient was walking around the neighborhood brandishing guns and telling people that he was going to "take them to heaven or hell".  Apparently when police confronted him they did  search his house and found firearms there.  On interview however the patient is actually quite pleasant although manic.  He tells me that the rapture is coming.  He has made multiple hyper religious statements since coming into the hospital emergency room including telling the emergency room doctor that he was God.  Patient has partial insight into the fact that he is manic.  He denies that he is been using any drugs or alcohol.  He does tell me that he has been decreasing the dose of his lithium recently.  I was able to find a note from his outpatient psychiatrist from just 12 days ago.  At that time the patient was asymptomatic and was thought to be doing very well.  They were having the patient taper off his lithium because of excessive urination.  Patient not able to describe to me any other new stressor that could have set this off.  He tells me that he is sleeping "quite well".  Medical history: Patient has chronic prostate swelling which creates urinary symptoms.  He has a history of allergic rhinitis.  History at some point in the past of having a dissected carotid artery but apparently not a symptomatic issue right now.  Substance abuse history: Denies any and there is nothing in the old chart.  Social history: Patient says that he lives by himself.  Very difficult to get much other meaningful history from him.  Past Psychiatric History: Patient is too disorganized right now to give me much useful history but looking back through the chart we have a lot of old records on him.  He has a history of type I bipolar disorder and there  are records of him presenting both with major depression and psychotic mania.  He has had hospitalizations within the past year.  Hyper religiosity seems to be the primary marker for when he is manic.  It does not appear that he is ever tried to kill himself or been truly violent to anyone else.  Patient has long been maintained on lithium but evidently has been complaining  about it because he thinks it makes him pee too much.  Risk to Self: Is patient at risk for suicide?: No Risk to Others:   Prior Inpatient Therapy:   Prior Outpatient Therapy:    Past Medical History:  Past Medical History:  Diagnosis Date  . Allergic rhinitis   . BPH (benign prostatic hyperplasia)    elev PSA resolved after abx 2008 Dr Diona Fanti (PSA 5.15 in 8/09)  . Depression    h/o bipolar Dr Letitia Neri at [facility]  . Dissection of carotid artery ([place]) 06/13/2011  . Elevated glucose   . Headache(784.0)   . Horner's syndrome   . Hyperlipidemia   . Hypothyroidism     Past Surgical History:  Procedure Laterality Date  . PROSTATE BIOPSY    . Vocal Cord Surgery  2011   implants   Family History:  Family History  Problem Relation Age of Onset  . Heart disease Mother        CHF  . Colon cancer Mother   . Diabetes Mother   . Hyperlipidemia Mother   . Heart disease Father        CHF  . Hypertension Other    Family Psychiatric  History: None known Social History:  Social History   Substance and Sexual Activity  Alcohol Use No     Social History   Substance and Sexual Activity  Drug Use No    Social History   Socioeconomic History  . Marital status: Widowed    Spouse name: Not on file  . Number of children: Not on file  . Years of education: Not on file  . Highest education level: Not on file  Occupational History  . Not on file  Social Needs  . Financial resource strain: Not on file  . Food insecurity:    Worry: Not on file    Inability: Not on file  . Transportation needs:    Medical: Not on file    Non-medical: Not on file  Tobacco Use  . Smoking status: Former Smoker    Types: Cigarettes, Cigars    Last attempt to quit: 07/28/2008    Years since quitting: 9.3  . Smokeless tobacco: Never Used  Substance and Sexual Activity  . Alcohol use: No  . Drug use: No  . Sexual activity: Not Currently  Lifestyle  . Physical activity:    Days per week: Not on  file    Minutes per session: Not on file  . Stress: Not on file  Relationships  . Social connections:    Talks on phone: Not on file    Gets together: Not on file    Attends religious service: Not on file    Active member of club or organization: Not on file    Attends meetings of clubs or organizations: Not on file    Relationship status: Not on file  Other Topics Concern  . Not on file  Social History Narrative   Regular Exercise - yes treadmill            Additional Social History:  Allergies:   Allergies  Allergen Reactions  . Fluoxetine Hcl Other (See Comments)    "severe mental reaction"  . Azithromycin Diarrhea  . Cephalexin Rash  . Erythromycin Nausea Only  . Ketorolac Tromethamine Nausea And Vomiting    Labs:  Results for orders placed or performed during the hospital encounter of 12/02/17 (from the past 48 hour(s))  Comprehensive metabolic panel     Status: Abnormal   Collection Time: 12/02/17  6:08 PM  Result Value Ref Range   Sodium 138 135 - 145 mmol/L   Potassium 3.8 3.5 - 5.1 mmol/L   Chloride 106 101 - 111 mmol/L   CO2 22 22 - 32 mmol/L   Glucose, Bld 118 (H) 65 - 99 mg/dL   BUN 21 (H) 6 - 20 mg/dL   Creatinine, Ser 0.87 0.61 - 1.24 mg/dL   Calcium 10.0 8.9 - 10.3 mg/dL   Total Protein 7.3 6.5 - 8.1 g/dL   Albumin 5.3 (H) 3.5 - 5.0 g/dL   AST 39 15 - 41 U/L   ALT 14 (L) 17 - 63 U/L   Alkaline Phosphatase 65 38 - 126 U/L   Total Bilirubin 1.2 0.3 - 1.2 mg/dL   GFR calc non Af Amer >60 >60 mL/min   GFR calc Af Amer >60 >60 mL/min    Comment: (NOTE) The eGFR has been calculated using the CKD EPI equation. This calculation has not been validated in all clinical situations. eGFR's persistently <60 mL/min signify possible Chronic Kidney Disease.    Anion gap 10 5 - 15    Comment: Performed at [facility], [address] [place], [place] [address]  Ethanol     Status: None   Collection Time: 12/02/17  6:08 PM  Result Value Ref  Range   Alcohol, Ethyl (B) <10 <10 mg/dL    Comment: (NOTE) Lowest detectable limit for serum alcohol is 10 mg/dL. For medical purposes only. Performed at [facility], [address] [place], [place] [address]   Salicylate level     Status: None   Collection Time: 12/02/17  6:08 PM  Result Value Ref Range   Salicylate Lvl <6.9 2.8 - 30.0 mg/dL    Comment: Performed at [facility], [address] [place], [place] [address]  Acetaminophen level     Status: Abnormal   Collection Time: 12/02/17  6:08 PM  Result Value Ref Range   Acetaminophen (Tylenol), Serum <10 (L) 10 - 30 ug/mL    Comment: (NOTE) Therapeutic concentrations vary significantly. A range of 10-30 ug/mL  may be an effective concentration for many patients. However, some  are best treated at concentrations outside of this range. Acetaminophen concentrations >150 ug/mL at 4 hours after ingestion  and >50 ug/mL at 12 hours after ingestion are often associated with  toxic reactions. Performed at [facility], [address] [place],  [address]   cbc     Status: None   Collection Time: 12/02/17  6:08 PM  Result Value Ref Range   WBC 8.3 3.8 - 10.6 K/uL   RBC 4.77 4.40 - 5.90 MIL/uL   Hemoglobin 16.0 13.0 - 18.0 g/dL   HCT 45.9 40.0 - 52.0 %   MCV 96.2 80.0 - 100.0 fL   MCH 33.4 26.0 - 34.0 pg   MCHC 34.8 32.0 - 36.0 g/dL   RDW 12.4 11.5 - 14.5 %   Platelets 234 150 - 440 K/uL    Comment: Performed at [facility],  [place], [place] [address]  Lithium level     Status: Abnormal   Collection Time: 12/02/17  7:11 PM  Result Value Ref Range   Lithium Lvl <0.06 (L) 0.60 - 1.20 mmol/L    Comment: Performed at [facility], [address] [place], [place] [address]    Current Facility-Administered Medications  Medication Dose Route Frequency Provider Last Rate Last Dose  . aspirin EC tablet 81 mg  81 mg Oral Daily Clapacs, Madie Reno, MD      .  Derrill Memo ON 12/03/2017] colesevelam [facility]) tablet 625 mg  625 mg Oral Q breakfast Clapacs, Madie Reno, MD      . Derrill Memo ON 12/03/2017] levothyroxine (SYNTHROID, LEVOTHROID) tablet 50 mcg  50 mcg Oral QAC breakfast Clapacs, John T, MD      . lithium carbonate (LITHOBID) CR tablet 600 mg  600 mg Oral QHS Clapacs, John T, MD      . montelukast (SINGULAIR) tablet 10 mg  10 mg Oral QHS Clapacs, John T, MD      . multivitamin-lutein (OCUVITE-LUTEIN) capsule 1 capsule  1 capsule Oral Daily Clapacs, John T, MD      . OLANZapine (ZYPREXA) tablet 10 mg  10 mg Oral Once Clapacs, John T, MD      . OLANZapine (ZYPREXA) tablet 10 mg  10 mg Oral QHS Clapacs, John T, MD       Current Outpatient Medications  Medication Sig Dispense Refill  . amantadine (SYMMETREL) 100 MG capsule Take 100 mg by mouth daily.      Marland Kitchen aspirin 81 MG EC tablet Take 1 tablet (81 mg total) by mouth 2 (two) times daily. 100 tablet 5  . Cholecalciferol 1000 UNITS tablet Take 1,000 Units by mouth daily.      . colesevelam (WELCHOL) 625 MG tablet Take 625-1,250 mg by mouth as needed. For diarrhea     . dutasteride (AVODART) 0.5 MG capsule Take 0.5 mg by mouth every other day.      . fish oil-omega-3 fatty acids 1000 MG capsule Take 1 g by mouth daily.      Marland Kitchen lamoTRIgine (LAMICTAL) 200 MG tablet Take 200 mg by mouth at bedtime.      Marland Kitchen levothyroxine (SYNTHROID) 50 MCG tablet Take 1.5 tablets (75 mcg total) by mouth daily before breakfast. 135 tablet 3  . lithium carbonate 300 MG capsule Take by mouth.    . Lutein 20 MG TABS Take 1 tablet by mouth daily. Reported on 11/29/2015    . meloxicam (MOBIC) 7.5 MG tablet Take 1 tablet (7.5 mg total) by mouth daily as needed for pain. 30 tablet 0  . mometasone (NASONEX) 50 MCG/ACT nasal spray Place 2 sprays into the nose daily. 51 g 3  . montelukast (SINGULAIR) 10 MG tablet Take 1 tablet (10 mg total) by mouth daily. Yearly physical due in May must see MD for future refills 90 tablet 3  . Multiple  Vitamins-Minerals (MULTIVITAMIN,TX-MINERALS) tablet Take 1 tablet by mouth daily.      Marland Kitchen OLANZapine (ZYPREXA) 10 MG tablet Take by mouth.    Marland Kitchen omeprazole (PRILOSEC) 40 MG capsule Take 1 capsule (40 mg total) by mouth daily as needed. 30 capsule 2  . Selenium 200 MCG CAPS Take 1 capsule by mouth daily. Reported on 11/29/2015    . zaleplon (SONATA) 5 MG capsule Take 5 mg by mouth at bedtime.        Musculoskeletal: Strength & Muscle Tone: within normal limits  Gait & Station: normal Patient leans: N/A  Psychiatric Specialty Exam: Physical Exam  Nursing note and vitals reviewed. Constitutional: He appears well-developed and well-nourished.  HENT:  Head: Normocephalic and atraumatic.  Eyes: Pupils are equal, round, and reactive to light. Conjunctivae are normal.  Neck: Normal range of motion.  Cardiovascular: Regular rhythm and normal heart sounds.  Respiratory: Effort normal. No respiratory distress.  GI: Soft.  Musculoskeletal: Normal range of motion.  Neurological: He is alert.  Skin: Skin is warm and dry.  Psychiatric: His affect is labile. His speech is rapid and/or pressured and tangential. He is agitated. He is not aggressive. Thought content is delusional. Cognition and memory are impaired. He expresses impulsivity and inappropriate judgment.    Review of Systems  Constitutional: Negative.   HENT: Negative.   Eyes: Negative.   Respiratory: Negative.   Cardiovascular: Negative.   Gastrointestinal: Negative.   Musculoskeletal: Negative.   Skin: Negative.   Neurological: Negative.   Psychiatric/Behavioral: Negative for depression, hallucinations, memory loss, substance abuse and suicidal ideas. The patient is not nervous/anxious and does not have insomnia.     Blood pressure 131/89, pulse 88, temperature 98.9 F (37.2 C), temperature source Oral, resp. rate 16, height 6' (1.829 m), weight 68.9 kg (152 lb), SpO2 95 %.Body mass index is 20.61 kg/m.  General Appearance: Casual   Eye Contact:  Good  Speech:  Pressured  Volume:  Increased  Mood:  Anxious  Affect:  Labile  Thought Process:  Disorganized  Orientation:  Full (Time, Place, and Person)  Thought Content:  Illogical, Delusions, Paranoid Ideation, Rumination and Tangential  Suicidal Thoughts:  No  Homicidal Thoughts:  No  Memory:  Immediate;   Fair Recent;   Poor Remote;   Poor  Judgement:  Impaired  Insight:  Shallow  Psychomotor Activity:  Restlessness  Concentration:  Concentration: Poor  Recall:  [place] of Knowledge:  Fair  Language:  Fair  Akathisia:  No  Handed:  Right  AIMS (if indicated):     Assets:  Desire for Improvement Housing Physical Health Resilience  ADL's:  Intact  Cognition:  WNL  Sleep:        Treatment Plan Summary: Daily contact with patient to assess and evaluate symptoms and progress in treatment, Medication management and Plan 62 year old man with bipolar disorder currently manic with psychotic symptoms.  Labs reviewed.  Plan at this point will be for admission to the psychiatric unit.  I have given an order for him to get some Zyprexa now and to restart that at a standing 10 mg at night.  Additionally am going to restart his lithium at 600 mg at night.  It looks like in the past he has taken more than that but that 600 was a fairly recent dose that was working.  We can continue the lamotrigine it is not clear if this contributes to the manic phase or not.  PRN medicine will be available.  Patient informed that he will be admitted to the hospital and has no complaint about it.  Case reviewed with TTS and ER physician.  Disposition: Recommend psychiatric Inpatient admission when medically cleared. Supportive therapy provided about ongoing stressors.  Alethia Berthold, MD 12/02/2017 7:34 PM

## 2017-12-02 NOTE — Progress Notes (Signed)
   12/02/17 1825  Clinical Encounter Type  Visited With Patient  Visit Type Initial  Referral From Nurse  Consult/Referral To Chaplain  Spiritual Encounters  Spiritual Needs Sacred text   Adam Keller received a PG to report to ED because PT wanted a Bible. PT had been offered a [place], and did not want that version. [employer] found a English Standard Version but PT did not want that either. PT stated that the [facility] stamp on the cover made the Bible "Not the word of God." Charlo explained what the Gideon[employer] did but he stated that "they should never put their name on God's word." Sims informed PT that no other Bible's were available. PT did not take the Bible and [place] departed PT's RM.

## 2017-12-02 NOTE — BH Assessment (Signed)
Patient is to be admitted to [facility] by Dr. Weber Cooks.  Attending Physician will be Dr. Bary Leriche.   Patient has been assigned to room 309, by [facility] Charge Nurse [place].   Intake Paper Work has been signed and placed on patient chart.  ER staff is aware of the admission:  [facility]   Dr. Karma Greaser, ER MD   Maudie Mercury Patient's Nurse   Genella Rife Patient Access.

## 2017-12-03 ENCOUNTER — Encounter: Payer: Self-pay | Admitting: Psychiatry

## 2017-12-03 DIAGNOSIS — F312 Bipolar disorder, current episode manic severe with psychotic features: Principal | ICD-10-CM

## 2017-12-03 LAB — LIPID PANEL
CHOLESTEROL: 204 mg/dL — AB (ref 0–200)
HDL: 71 mg/dL (ref >40)
LDL Cholesterol: 126 mg/dL — ABNORMAL HIGH (ref 0–99)
TRIGLYCERIDES: 35 mg/dL (ref <150)
Total CHOL/HDL Ratio: 2.9 RATIO
VLDL: 7 mg/dL (ref 0–40)

## 2017-12-03 LAB — HEMOGLOBIN A1C
Hgb A1c MFr Bld: 4.7 % — ABNORMAL LOW (ref 4.8–5.6)
Mean Plasma Glucose: 88.19 mg/dL

## 2017-12-03 LAB — TSH: TSH: 3.086 u[IU]/mL (ref 0.350–4.500)

## 2017-12-03 MED ORDER — HALOPERIDOL LACTATE 5 MG/ML IJ SOLN
INTRAMUSCULAR | Status: AC
  Administered 2017-12-03: 5 mg
  Filled 2017-12-03: qty 2

## 2017-12-03 MED ORDER — DIPHENHYDRAMINE HCL 25 MG PO CAPS
50.0000 mg | ORAL_CAPSULE | Freq: Four times a day (QID) | ORAL | Status: DC | PRN
  Administered 2017-12-03 – 2017-12-09 (×7): 50 mg via ORAL
  Filled 2017-12-03 (×7): qty 2

## 2017-12-03 MED ORDER — LORAZEPAM 2 MG/ML IJ SOLN
INTRAMUSCULAR | Status: AC
  Administered 2017-12-03: 2 mg
  Filled 2017-12-03: qty 1

## 2017-12-03 MED ORDER — DIPHENHYDRAMINE HCL 50 MG/ML IJ SOLN
INTRAMUSCULAR | Status: AC
  Administered 2017-12-03: 50 mg
  Filled 2017-12-03: qty 1

## 2017-12-03 MED ORDER — LORAZEPAM 2 MG/ML IJ SOLN
2.0000 mg | Freq: Four times a day (QID) | INTRAMUSCULAR | Status: DC | PRN
  Administered 2017-12-05 – 2017-12-06 (×2): 2 mg via INTRAMUSCULAR
  Filled 2017-12-03 (×2): qty 1

## 2017-12-03 MED ORDER — HALOPERIDOL 5 MG PO TABS
10.0000 mg | ORAL_TABLET | Freq: Four times a day (QID) | ORAL | Status: DC | PRN
  Administered 2017-12-03 – 2017-12-09 (×9): 10 mg via ORAL
  Filled 2017-12-03 (×9): qty 2

## 2017-12-03 MED ORDER — OLANZAPINE 5 MG PO TABS
15.0000 mg | ORAL_TABLET | Freq: Two times a day (BID) | ORAL | Status: DC
  Administered 2017-12-03 – 2017-12-11 (×16): 15 mg via ORAL
  Filled 2017-12-03 (×16): qty 1

## 2017-12-03 MED ORDER — LITHIUM CARBONATE 300 MG PO CAPS
300.0000 mg | ORAL_CAPSULE | Freq: Three times a day (TID) | ORAL | Status: DC
  Administered 2017-12-03 – 2017-12-05 (×7): 300 mg via ORAL
  Filled 2017-12-03 (×7): qty 1

## 2017-12-03 MED ORDER — LORAZEPAM 2 MG PO TABS
2.0000 mg | ORAL_TABLET | Freq: Four times a day (QID) | ORAL | Status: DC | PRN
Start: 2017-12-03 — End: 2017-12-11
  Administered 2017-12-03 – 2017-12-09 (×11): 2 mg via ORAL
  Filled 2017-12-03 (×11): qty 1

## 2017-12-03 MED ORDER — HALOPERIDOL LACTATE 5 MG/ML IJ SOLN
10.0000 mg | Freq: Four times a day (QID) | INTRAMUSCULAR | Status: DC | PRN
  Administered 2017-12-05 – 2017-12-06 (×2): 10 mg via INTRAMUSCULAR
  Filled 2017-12-03 (×2): qty 2

## 2017-12-03 MED ORDER — DIPHENHYDRAMINE HCL 50 MG/ML IJ SOLN
50.0000 mg | Freq: Four times a day (QID) | INTRAMUSCULAR | Status: DC | PRN
Start: 2017-12-03 — End: 2017-12-11
  Administered 2017-12-06: 50 mg via INTRAMUSCULAR
  Filled 2017-12-03: qty 1

## 2017-12-03 NOTE — Progress Notes (Signed)
1 pm -2 pm: Patient sitting in dayroom watching television, eating lunch. Patient states his lunch is good. MHT and security guard within normal reach.   2 pm-  3 pm: Patient asleep in room, breaths are even and unlabored.  MHT and Animal nutritionist present.  Nurse will continue to monitor.  3pm- 4 pm: Patient walking around in the back hallway. Pushed security alarm button. Patient asked to go back to his room. Patient sitting on his bed.  4pm- 5pm: Patient asleep in room, breaths even, no distress noted.  5 pm-6pm:  Patient in hallway on locked unit, being intrusive trying to go into other patients rooms, staff tying to redirect patient.   6 pm- 7pm Patient becoming agitated on the back hall. Nurse/ MHT/ Security able to talk with patient and patient agreed to take oral PRN medication. Patient agreed to go into his room and take a nap.
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
## 2017-12-03 NOTE — [facility] Group Notes (Signed)
[facility] Group Notes:  (Nursing/MHT/Case Management/Adjunct)  Date:  12/03/2017  Time:  10:58 PM  Type of Therapy:  Group Therapy  Participation Level:  Did Not Attend  Summary of Progress/Problems:  Adam Keller 12/03/2017, 10:58 PM

## 2017-12-03 NOTE — Progress Notes (Signed)
Recreation Therapy Notes   Date: 12/03/2017  Time: 9:30 am  Location: Craft Room  Behavioral response: Redirected, Inappropriate  Intervention Topic: Animal Assisted Therapy  Discussion/Intervention:  Patient participated in Animal Assisted Therapy during group today. Group facilitator defined Animal Assisted Therapy as the use of animals as a therapeutic tool to assist a person in restoring balance to their life.  The group facilitator also described the benefits of Animal Assisted Therapy as improving patients' mental, physical, social and emotional functioning with the aid of animals; depending on the needs of the patient. Individuals in the group were able to pet the dogs as well as ask questions. Clinical Observations/Feedback:  Patient came to group late due to unknown reasons. Indivudallly made several inappropraaite comments about the dogs during group.He was redirected by staff and peers during group. Patient eventually left group early due to unknown reasons and never returned.  Adam Keller LRT/CTRS            Latoy Labriola 12/03/2017 10:46 AM

## 2017-12-03 NOTE — Plan of Care (Signed)
Pt has been calm this evening. Pt compliant with medications. Pt was sleeping most of evening and only up for medications. Pt denies SI/HI. Pt is receptive to treatment and safety maintained on unit. Will continue to monitor.  Problem: Coping: Goal: Coping ability will improve Outcome: Progressing Goal: Will verbalize feelings Outcome: Progressing   Problem: Safety: Goal: Ability to redirect hostility and anger into socially appropriate behaviors will improve Outcome: Progressing Goal: Ability to remain free from injury will improve Outcome: Progressing   Problem: Education: Goal: Utilization of techniques to improve thought processes will improve Outcome: Progressing Goal: Knowledge of the prescribed therapeutic regimen will improve Outcome: Progressing
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
## 2017-12-03 NOTE — BHH Group Notes (Signed)
LCSW Group Therapy Note 12/03/2017 9:00 AM  Type of Therapy and Topic:  Group Therapy:  Setting Goals  Participation Level:  Did Not Attend  Description of Group: In this process group, patients discussed using strengths to work toward goals and address challenges.  Patients identified two positive things about themselves and one goal they were working on.  Patients were given the opportunity to share openly and support each other's plan for self-empowerment.  The group discussed the value of gratitude and were encouraged to have a daily reflection of positive characteristics or circumstances.  Patients were encouraged to identify a plan to utilize their strengths to work on current challenges and goals.  Therapeutic Goals 1. Patient will verbalize personal strengths/positive qualities and relate how these can assist with achieving desired personal goals 2. Patients will verbalize affirmation of peers plans for personal change and goal setting 3. Patients will explore the value of gratitude and positive focus as related to successful achievement of goals 4. Patients will verbalize a plan for regular reinforcement of personal positive qualities and circumstances.  Summary of Patient Progress: Adam Keller was invited to today's group, but chose not to attend.      Therapeutic Modalities Cognitive Behavioral Therapy Motivational Interviewing    Adam Keller, [place] 12/03/2017 11:27 AM
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
## 2017-12-03 NOTE — BHH Group Notes (Signed)
  12/03/2017  Time: 1PM  Type of Therapy/Topic:  Group Therapy:  Balance in Life  Participation Level:  Did Not Attend  Description of Group:   This group will address the concept of balance and how it feels and looks when one is unbalanced. Patients will be encouraged to process areas in their lives that are out of balance and identify reasons for remaining unbalanced. Facilitators will guide patients in utilizing problem-solving interventions to address and correct the stressor making their life unbalanced. Understanding and applying boundaries will be explored and addressed for obtaining and maintaining a balanced life. Patients will be encouraged to explore ways to assertively make their unbalanced needs known to significant others in their lives, using other group members and facilitator for support and feedback.  Therapeutic Goals: 1. Patient will identify two or more emotions or situations they have that consume much of in their lives. 2. Patient will identify signs/triggers that life has become out of balance:  3. Patient will identify two ways to set boundaries in order to achieve balance in their lives:  4. Patient will demonstrate ability to communicate their needs through discussion and/or role plays  Summary of Patient Progress: Pt was invited to attend group but chose not to attend. CSW will continue to encourage pt to attend group throughout their admission.    Therapeutic Modalities:   Cognitive Behavioral Therapy Solution-Focused Therapy Assertiveness Training  Alden Hipp, MSW, LCSW Clinical Social Worker 12/03/2017 1:58 PM
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
## 2017-12-04 NOTE — Tx Team (Signed)
Interdisciplinary Treatment and Diagnostic Plan Update  12/04/2017 Time of Session: 10:30 AM Adam Keller MRN: [id number]  Principal Diagnosis: Bipolar affective disorder, manic, severe, with psychotic behavior ([place])  Secondary Diagnoses: Principal Problem:   Bipolar affective disorder, manic, severe, with psychotic behavior ([place]) Active Problems:   Hypothyroidism   Dyslipidemia   Current Medications:  Current Facility-Administered Medications  Medication Dose Route Frequency Provider Last Rate Last Dose  . acetaminophen (TYLENOL) tablet 650 mg  650 mg Oral Q6H PRN Clapacs, John T, MD      . alum & mag hydroxide-simeth (MAALOX/MYLANTA) 200-200-20 MG/5ML suspension 30 mL  30 mL Oral Q4H PRN Clapacs, John T, MD      . aspirin EC tablet 81 mg  81 mg Oral Daily Clapacs, Madie Reno, MD   81 mg at 12/04/17 0807  . colesevelam [facility]) tablet 625 mg  625 mg Oral Q breakfast Clapacs, Madie Reno, MD   625 mg at 12/04/17 0807  . diphenhydrAMINE (BENADRYL) capsule 50 mg  50 mg Oral Q6H PRN Pucilowska, Jolanta B, MD   50 mg at 12/03/17 1832   Or  . diphenhydrAMINE (BENADRYL) injection 50 mg  50 mg Intramuscular Q6H PRN Pucilowska, Jolanta B, MD      . haloperidol (HALDOL) tablet 10 mg  10 mg Oral Q6H PRN Pucilowska, Jolanta B, MD   10 mg at 12/03/17 1832   Or  . haloperidol lactate (HALDOL) injection 10 mg  10 mg Intramuscular Q6H PRN Pucilowska, Jolanta B, MD      . hydrOXYzine (ATARAX/VISTARIL) tablet 50 mg  50 mg Oral TID PRN Clapacs, Madie Reno, MD   50 mg at 12/03/17 2001  . lamoTRIgine (LAMICTAL) tablet 200 mg  200 mg Oral Daily Clapacs, Madie Reno, MD   200 mg at 12/04/17 0807  . levothyroxine (SYNTHROID, LEVOTHROID) tablet 50 mcg  50 mcg Oral QAC breakfast Clapacs, Madie Reno, MD   50 mcg at 12/04/17 [phone]  . lithium carbonate capsule 300 mg  300 mg Oral TID WC Pucilowska, Jolanta B, MD   300 mg at 12/04/17 1145  . LORazepam (ATIVAN) tablet 2 mg  2 mg Oral Q6H PRN Pucilowska, Jolanta B, MD   2 mg at 12/04/17  0641   Or  . LORazepam (ATIVAN) injection 2 mg  2 mg Intramuscular Q6H PRN Pucilowska, Jolanta B, MD      . magnesium hydroxide (MILK OF MAGNESIA) suspension 30 mL  30 mL Oral Daily PRN Clapacs, John T, MD      . montelukast (SINGULAIR) tablet 10 mg  10 mg Oral QHS Clapacs, Madie Reno, MD   10 mg at 12/03/17 2001  . multivitamin-lutein (OCUVITE-LUTEIN) capsule 1 capsule  1 capsule Oral Daily Clapacs, Madie Reno, MD   1 capsule at 12/04/17 0807  . OLANZapine (ZYPREXA) tablet 15 mg  15 mg Oral BID AC & HS Pucilowska, Jolanta B, MD   15 mg at 12/04/17 0812  . temazepam (RESTORIL) capsule 30 mg  30 mg Oral QHS PRN Clapacs, Madie Reno, MD       PTA Medications: Medications Prior to Admission  Medication Sig Dispense Refill Last Dose  . amantadine (SYMMETREL) 100 MG capsule Take 100 mg by mouth daily.     Taking  . aspirin 81 MG EC tablet Take 1 tablet (81 mg total) by mouth 2 (two) times daily. 100 tablet 5 Taking  . Cholecalciferol 1000 UNITS tablet Take 1,000 Units by mouth daily.     Taking  .  colesevelam (WELCHOL) 625 MG tablet Take 625-1,250 mg by mouth as needed. For diarrhea    Taking  . dutasteride (AVODART) 0.5 MG capsule Take 0.5 mg by mouth every other day.     Taking  . fish oil-omega-3 fatty acids 1000 MG capsule Take 1 g by mouth daily.     Taking  . lamoTRIgine (LAMICTAL) 200 MG tablet Take 200 mg by mouth at bedtime.     Taking  . levothyroxine (SYNTHROID) 50 MCG tablet Take 1.5 tablets (75 mcg total) by mouth daily before breakfast. 135 tablet 3   . lithium carbonate 300 MG capsule Take by mouth.   Taking  . Lutein 20 MG TABS Take 1 tablet by mouth daily. Reported on 11/29/2015   Taking  . meloxicam (MOBIC) 7.5 MG tablet Take 1 tablet (7.5 mg total) by mouth daily as needed for pain. 30 tablet 0   . mometasone (NASONEX) 50 MCG/ACT nasal spray Place 2 sprays into the nose daily. 51 g 3 Taking  . montelukast (SINGULAIR) 10 MG tablet Take 1 tablet (10 mg total) by mouth daily. Yearly physical due  in May must see MD for future refills 90 tablet 3 Taking  . Multiple Vitamins-Minerals (MULTIVITAMIN,TX-MINERALS) tablet Take 1 tablet by mouth daily.     Taking  . OLANZapine (ZYPREXA) 10 MG tablet Take by mouth.   Taking  . omeprazole (PRILOSEC) 40 MG capsule Take 1 capsule (40 mg total) by mouth daily as needed. 30 capsule 2   . Selenium 200 MCG CAPS Take 1 capsule by mouth daily. Reported on 11/29/2015   Taking  . zaleplon (SONATA) 5 MG capsule Take 5 mg by mouth at bedtime.     Taking    Patient Stressors: Financial difficulties Health problems Medication change or noncompliance Occupational concerns  Patient Strengths: Capable of independent living Curator fund of knowledge Motivation for treatment/growth  Treatment Modalities: Medication Management, Group therapy, Case management,  1 to 1 session with clinician, Psychoeducation, Recreational therapy.   Physician Treatment Plan for Primary Diagnosis: Bipolar affective disorder, manic, severe, with psychotic behavior ([place]) Long Term Goal(s): Improvement in symptoms so as ready for discharge NA   Short Term Goals: Ability to identify changes in lifestyle to reduce recurrence of condition will improve Ability to verbalize feelings will improve Ability to disclose and discuss suicidal ideas Ability to demonstrate self-control will improve Ability to identify and develop effective coping behaviors will improve Ability to maintain clinical measurements within normal limits will improve Compliance with prescribed medications will improve Ability to identify triggers associated with substance abuse/mental health issues will improve NA  Medication Management: Evaluate patient's response, side effects, and tolerance of medication regimen.  Therapeutic Interventions: 1 to 1 sessions, Unit Group sessions and Medication administration.  Evaluation of Outcomes: Progressing  Physician Treatment Plan for Secondary  Diagnosis: Principal Problem:   Bipolar affective disorder, manic, severe, with psychotic behavior ([place]) Active Problems:   Hypothyroidism   Dyslipidemia  Long Term Goal(s): Improvement in symptoms so as ready for discharge NA   Short Term Goals: Ability to identify changes in lifestyle to reduce recurrence of condition will improve Ability to verbalize feelings will improve Ability to disclose and discuss suicidal ideas Ability to demonstrate self-control will improve Ability to identify and develop effective coping behaviors will improve Ability to maintain clinical measurements within normal limits will improve Compliance with prescribed medications will improve Ability to identify triggers associated with substance abuse/mental health issues will improve NA  Medication Management: Evaluate patient's response, side effects, and tolerance of medication regimen.  Therapeutic Interventions: 1 to 1 sessions, Unit Group sessions and Medication administration.  Evaluation of Outcomes: Progressing   RN Treatment Plan for Primary Diagnosis: Bipolar affective disorder, manic, severe, with psychotic behavior ([place]) Long Term Goal(s): Knowledge of disease and therapeutic regimen to maintain health will improve  Short Term Goals: Ability to remain free from injury will improve, Ability to demonstrate self-control and Compliance with prescribed medications will improve  Medication Management: RN will administer medications as ordered by provider, will assess and evaluate patient's response and provide education to patient for prescribed medication. RN will report any adverse and/or side effects to prescribing provider.  Therapeutic Interventions: 1 on 1 counseling sessions, Psychoeducation, Medication administration, Evaluate responses to treatment, Monitor vital signs and CBGs as ordered, Perform/monitor CIWA, COWS, AIMS and Fall Risk screenings as ordered, Perform wound care treatments as  ordered.  Evaluation of Outcomes: Progressing   LCSW Treatment Plan for Primary Diagnosis: Bipolar affective disorder, manic, severe, with psychotic behavior ([place]) Long Term Goal(s): Safe transition to appropriate next level of care at discharge, Engage patient in therapeutic group addressing interpersonal concerns.  Short Term Goals: Engage patient in aftercare planning with referrals and resources and Increase skills for wellness and recovery  Therapeutic Interventions: Assess for all discharge needs, 1 to 1 time with Social worker, Explore available resources and support systems, Assess for adequacy in community support network, Educate family and significant other(s) on suicide prevention, Complete Psychosocial Assessment, Interpersonal group therapy.  Evaluation of Outcomes: Progressing   Progress in Treatment: Attending groups: No. Participating in groups: No. Taking medication as prescribed: Yes. Toleration medication: Yes. Family/Significant other contact made: No, will contact:  Pt did not give consent to CSW to contact any family or support person. Patient understands diagnosis: No. Discussing patient identified problems/goals with staff: Yes. Medical problems stabilized or resolved: Yes. Denies suicidal/homicidal ideation: Yes. Issues/concerns per patient self-inventory: No. Other: n/a  New problem(s) identified: No, Describe:  No new problems identified.  New Short Term/Long Term Goal(s):  Patient Goals:  "To get out of her as soon as possible"  Discharge Plan or Barriers: Tentative plan is for pt to return to his home and resume outpatient services with psychiatrist [facility].  Reason for Continuation of Hospitalization: Delusions  Mania Medication stabilization  Estimated Length of Stay: 5-7 days  Attendees: Patient: Adam Keller 12/04/2017 2:08 PM  Physician: Orson Slick, MD 12/04/2017 2:08 PM  Nursing: Elige Radon, RN  12/04/2017 2:08 PM  RN Care Manager: 12/04/2017 2:08 PM  Social Worker: Derrek Gu, LCSW 12/04/2017 2:08 PM  Recreational Therapist: Roanna Epley, LRT 12/04/2017 2:08 PM  Other: Darin Engels, [place] 12/04/2017 2:08 PM  Other:  12/04/2017 2:08 PM  Other: 12/04/2017 2:08 PM    Scribe for Treatment Team: Devona Konig, LCSW 12/04/2017 2:08 PM

## 2017-12-04 NOTE — Progress Notes (Signed)
10 am -Patient appropriate behavior with 1:1. 11 am -Patient continues to be disorganized and hyper religious. 12 N-Patient eating lunch with good appetite. 01 pm -Patient appropriate behavior with 1:1. 02 pm -Patient resting in bed. 03 pm -Patient in day room.No aggressive behaviors. 04 pm -Patient appropriate behavior with 1:1. 05 pm -Patient appropriate behavior with 1:1.

## 2017-12-04 NOTE — Progress Notes (Signed)
   12/04/17 1815  Clinical Encounter Type  Visited With Patient  Visit Type Follow-up  Referral From Nurse  Consult/Referral To Chaplain  Spiritual Encounters  Spiritual Needs Prayer;Emotional   [place] received a PG to come and speak with PT at PT's request. [place] meet with PT in the guarded part of BHU in the day RM. PT was pleasant and thankful for the visit. PT spoke in a very incoherent manner. PT spoke very softly and jumped from subject of God and wanting prayer. [place] prayed with PT. Will follow up.

## 2017-12-04 NOTE — BHH Group Notes (Signed)

## 2017-12-04 NOTE — Plan of Care (Signed)
Patient talks disorganized and shows gestures.Calm and cooperative.Denies SI,HI and AVH.No aggressive behaviors noted.

## 2017-12-04 NOTE — Progress Notes (Signed)
Recreation Therapy Notes  Date: 12/04/2017  Time: 9:30 am   Location: Craft Room   Behavioral response: N/A   Intervention Topic:  Leisure  Discussion/Intervention: Patient did not attend group.   Clinical Observations/Feedback:  Patient did not attend group.   Karalee Hauter LRT/CTRS        Netha Dafoe 12/04/2017 12:10 PM

## 2017-12-04 NOTE — Progress Notes (Signed)
Recreation Therapy Notes  INPATIENT RECREATION THERAPY ASSESSMENT  Patient Details Name: Adam Keller MRN: [id number] DOB: 19-Jun-1956 Today's Date: 12/04/2017   Patient unable to participate in assessment at this time.       Information Obtained From:    Able to Participate in Assessment/Interview:    Patient Presentation:    Reason for Admission (Per Patient):    Patient Stressors:    Coping Skills:      Leisure Interests (2+):     Frequency of Recreation/Participation:    Awareness of Community Resources:     [employer]:     Current Use:    If no, Barriers?:    Expressed Interest in [place] of Residence:     Patient Main Form of Transportation:    Patient Strengths:     Patient Identified Areas of Improvement:     Patient Goal for Hospitalization:     Current SI (including self-harm):     Current HI:     Current AVH:    Staff Intervention Plan:    Consent to Intern Participation:    Idil Maslanka 12/04/2017, 8:32 AM
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
## 2017-12-04 NOTE — Progress Notes (Signed)
Compliant with medication. 

## 2017-12-04 NOTE — Progress Notes (Signed)
Patient calm and cooperative on approach.Talks disorganized and hyper religious.Placed paper towels on the bath room floor states "I am making the wayt for Jesus".

## 2017-12-05 LAB — BASIC METABOLIC PANEL
Anion gap: 5 (ref 5–15)
BUN: 14 mg/dL (ref 6–20)
CALCIUM: 9.7 mg/dL (ref 8.9–10.3)
CO2: 26 mmol/L (ref 22–32)
Chloride: 108 mmol/L (ref 101–111)
Creatinine, Ser: 0.75 mg/dL (ref 0.61–1.24)
Glucose, Bld: 105 mg/dL — ABNORMAL HIGH (ref 65–99)
Potassium: 3.9 mmol/L (ref 3.5–5.1)
SODIUM: 139 mmol/L (ref 135–145)

## 2017-12-05 LAB — LITHIUM LEVEL: LITHIUM LVL: 0.5 mmol/L — AB (ref 0.60–1.20)

## 2017-12-05 MED ORDER — LITHIUM CARBONATE 300 MG PO CAPS
600.0000 mg | ORAL_CAPSULE | Freq: Two times a day (BID) | ORAL | Status: DC
  Administered 2017-12-05 – 2017-12-14 (×18): 600 mg via ORAL
  Filled 2017-12-05 (×18): qty 2

## 2017-12-05 NOTE — BHH Group Notes (Signed)
LCSW Group Therapy Note  12/05/2017 1:15pm  Type of Therapy and Topic:  Group Therapy:  Fears and Unhealthy Coping Skills  Participation Level:  Did Not Attend   Description of Group:  The focus of this group was to discuss some of the prevalent fears that patients experience, and to identify the commonalities among group members.  An exercise was used to initiate the discussion, followed by writing on the white board a group-generated list of unhealthy coping and healthy coping techniques to deal with each fear.    Therapeutic Goals: 1. Patient will identify and describe 3 fears they experience 2. Patient will identify one positive coping strategy for each fear they experience 3. Patient will respond empathically to peers statements regarding fears they experience  Summary of Patient Progress:  Pt was invited to attend group but chose not to attend. CSW will continue to encourage pt to attend group throughout their admission.        Therapeutic Modalities Cognitive Behavioral Therapy Motivational Interviewing  Cheree Ditto, LCSW 12/05/2017 12:43 PM

## 2017-12-05 NOTE — Plan of Care (Addendum)
Patient found lying in bed awake upon my arrival. Patient remains behind locked door as per MD order with sitter and security guard present. Contacted Dr. Weber Cooks for renewal of observation order. Patient remains confused and labile. Speech is primarily incoherent, garbled, and mumbled. Patient is floridly psychotic. Patient is touching walls and floors, walking with one foot in front of the other as if on a tightrope, and speaking to someone not there. When questioned, patient is unable to verbalize what he is doing or what he sees. Patient is restless and unable to lie down for more than a few minutes even after being medicated. Patient becomes angry when redirected. Patient attempted to urinate in the day room in the back. 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 Patient pulled all the linens off of his bad and attempted to tie all the corners of the sheet into knots. Patient behavior is extremely bizarre and patient is menacing at times. Remains religiously preoccupied but most of his speech is incoherent. Refused EKG. Compliant with HS medications and given Haldol, Benadryl, Ativan PO as well as Vistaril and Restoril PRN with very little effect. Remains awake after more than an hour. Will continue to monitor throughout the shift.  Problem: Education: Goal: Knowledge of the prescribed therapeutic regimen will improve Outcome: Progressing   Problem: Health Behavior/Discharge Planning: Goal: Compliance with therapeutic regimen will improve Outcome: Progressing   Problem: Coping: Goal: Coping ability will improve Outcome: Not Progressing Goal: Will verbalize feelings Outcome: Not Progressing   Problem: Safety: Goal: Ability to redirect hostility and anger into socially appropriate behaviors will improve Outcome: Not Progressing Goal: Ability to remain free from injury will improve Outcome: Not Progressing   Problem: Education: Goal:  Utilization of techniques to improve thought processes will improve Outcome: Not Progressing   Problem: Health Behavior/Discharge Planning: Goal: Ability to make decisions will improve Outcome: Not Progressing   Problem: Activity: Goal: Will identify at least one activity in which they can participate Outcome: Not Progressing   Problem: Coping: Goal: Ability to identify and develop effective coping behavior will improve Outcome: Not Progressing Goal: Ability to interact with others will improve Outcome: Not Progressing Goal: Demonstration of participation in decision-making regarding own care will improve Outcome: Not Progressing Goal: Ability to use eye contact when communicating with others will improve Outcome: Not Progressing   Problem: Self-Concept: Goal: Will verbalize positive feelings about self Outcome: Not Progressing   Problem: Education: Goal: Knowledge of General Education information will improve Outcome: Not Progressing   Problem: Health Behavior/Discharge Planning: Goal: Ability to manage health-related needs will improve Outcome: Not Progressing   Problem: Clinical Measurements: Goal: Ability to maintain clinical measurements within normal limits will improve Outcome: Not Progressing Goal: Will remain free from infection Outcome: Not Progressing Goal: Diagnostic test results will improve Outcome: Not Progressing Goal: Respiratory complications will improve Outcome: Not Progressing Goal: Cardiovascular complication will be avoided Outcome: Not Progressing   Problem: Activity: Goal: Risk for activity intolerance will decrease Outcome: Not Progressing   Problem: Nutrition: Goal: Adequate nutrition will be maintained Outcome: Not Progressing   Problem: Coping: Goal: Level of anxiety will decrease Outcome: Not Progressing

## 2017-12-05 NOTE — Progress Notes (Signed)
[facility] MD Progress Note  12/05/2017 1:38 PM Adam Keller  MRN:  [id number] Subjective: Follow-up 62 year old man with bipolar disorder.  Patient seen chart reviewed labs reviewed.  Patient continues to be psychotic agitated hyper religious.  Paces around a lot.  Occasionally has labile and even agitated mood.  Talking with me he just wanted to ramble about how the rapture is coming.  Patient is eating reasonably well.  Sleep still a little impaired.  Tolerating medicine okay.  Lithium level today 0.5 BUN and creatinine normal. Principal Problem: Bipolar affective disorder, manic, severe, with psychotic behavior ([place]) Diagnosis:   Patient Active Problem List   Diagnosis Date Noted  . Bipolar affective disorder, current episode manic with psychotic symptoms ([place]) [F31.2] 12/02/2017  . Bipolar affective disorder, manic, severe, with psychotic behavior ([place]) [F31.2] 12/02/2017  . Syncope [R55] 10/01/2017  . Grief [F43.21] 12/01/2016  . Tongue lesion [K14.8] 12/01/2016  . Loss of weight [R63.4] 05/30/2016  . Well adult exam [Z00.00] 05/08/2014  . Hyperglycemia [R73.9] 10/28/2012  . 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 Left facial pain [R51] 06/09/2011  . 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 PSA, INCREASED [R97.20] 07/10/2010  . ANKLE PAIN [M25.579] 04/08/2010  . Edema [R60.9] 04/08/2010  . Shoulder pain, right [M25.511] 03/06/2010  . Diarrhea [R19.7] 03/28/2009  . HOARSENESS [R49.8] 03/07/2009  . Hypothyroidism [E03.9] 08/10/2007  . Dyslipidemia [E78.5] 08/10/2007  . Bipolar depression ([place]) [F31.9] 08/10/2007  . Allergic rhinitis [J30.9] 08/10/2007  . BPH (benign prostatic hyperplasia) [N40.0] 08/10/2007   Total Time spent with patient: 30 minutes  Past Psychiatric History: Past history of bipolar disorder recent discontinuation of lithium.  Past Medical History:  Past Medical  History:  Diagnosis Date  . Allergic rhinitis   . BPH (benign prostatic hyperplasia)    elev PSA resolved after abx 2008 Dr Diona Fanti (PSA 5.15 in 8/09)  . Depression    h/o bipolar Dr Letitia Neri at [facility]  . Dissection of carotid artery ([place]) 06/13/2011  . Elevated glucose   . Headache(784.0)   . Horner's syndrome   . Hyperlipidemia   . Hypothyroidism     Past Surgical History:  Procedure Laterality Date  . PROSTATE BIOPSY    . Vocal Cord Surgery  2011   implants   Family History:  Family History  Problem Relation Age of Onset  . Heart disease Mother        CHF  . Colon cancer Mother   . Diabetes Mother   . Hyperlipidemia Mother   . Heart disease Father        CHF  . Hypertension Other    Family Psychiatric  History: Bipolar Social History:  Social History   Substance and Sexual Activity  Alcohol Use No     Social History   Substance and Sexual Activity  Drug Use No    Social History   Socioeconomic History  . Marital status: Widowed    Spouse name: Not on file  . Number of children: Not on file  . Years of education: Not on file  . Highest education level: Not on file  Occupational History  . Not on file  Social Needs  . Financial resource strain: Not on file  . Food insecurity:    Worry: Not on file    Inability: Not on file  . Transportation needs:    Medical: Not on file    Non-medical:  Not on file  Tobacco Use  . Smoking status: Former Smoker    Types: Cigarettes, Cigars    Last attempt to quit: 07/28/2008    Years since quitting: 9.3  . Smokeless tobacco: Never Used  Substance and Sexual Activity  . Alcohol use: No  . Drug use: No  . Sexual activity: Not Currently  Lifestyle  . Physical activity:    Days per week: Not on file    Minutes per session: Not on file  . Stress: Not on file  Relationships  . Social connections:    Talks on phone: Not on file    Gets together: Not on file    Attends religious service: Not on file    Active  member of club or organization: Not on file    Attends meetings of clubs or organizations: Not on file    Relationship status: Not on file  Other Topics Concern  . Not on file  Social History Narrative   Regular Exercise - yes treadmill            Additional Social History:                         Sleep: Fair  Appetite:  Fair  Current Medications: Current Facility-Administered Medications  Medication Dose Route Frequency Provider Last Rate Last Dose  . acetaminophen (TYLENOL) tablet 650 mg  650 mg Oral Q6H PRN Sultan Pargas T, MD      . alum & mag hydroxide-simeth (MAALOX/MYLANTA) 200-200-20 MG/5ML suspension 30 mL  30 mL Oral Q4H PRN Aeriel Boulay T, MD      . aspirin EC tablet 81 mg  81 mg Oral Daily Cleona Doubleday, Madie Reno, MD   81 mg at 12/05/17 1660  . colesevelam [facility]) tablet 625 mg  625 mg Oral Q breakfast Zianna Dercole, Madie Reno, MD   625 mg at 12/05/17 6301  . diphenhydrAMINE (BENADRYL) capsule 50 mg  50 mg Oral Q6H PRN Pucilowska, Jolanta B, MD   50 mg at 12/04/17 2107   Or  . diphenhydrAMINE (BENADRYL) injection 50 mg  50 mg Intramuscular Q6H PRN Pucilowska, Jolanta B, MD      . haloperidol (HALDOL) tablet 10 mg  10 mg Oral Q6H PRN Pucilowska, Jolanta B, MD   10 mg at 12/05/17 1100   Or  . haloperidol lactate (HALDOL) injection 10 mg  10 mg Intramuscular Q6H PRN Pucilowska, Jolanta B, MD      . hydrOXYzine (ATARAX/VISTARIL) tablet 50 mg  50 mg Oral TID PRN Connar Keating, Madie Reno, MD   50 mg at 12/03/17 2001  . lamoTRIgine (LAMICTAL) tablet 200 mg  200 mg Oral Daily Rockland Kotarski, Madie Reno, MD   200 mg at 12/05/17 0807  . levothyroxine (SYNTHROID, LEVOTHROID) tablet 50 mcg  50 mcg Oral QAC breakfast Maleek Craver, Madie Reno, MD   50 mcg at 12/05/17 [phone]  . lithium carbonate capsule 600 mg  600 mg Oral BID WC Lorain Fettes T, MD      . LORazepam (ATIVAN) tablet 2 mg  2 mg Oral Q6H PRN Pucilowska, Jolanta B, MD   2 mg at 12/05/17 1100   Or  . LORazepam (ATIVAN) injection 2 mg  2 mg Intramuscular  Q6H PRN Pucilowska, Jolanta B, MD      . magnesium hydroxide (MILK OF MAGNESIA) suspension 30 mL  30 mL Oral Daily PRN Arihant Pennings T, MD      . montelukast (SINGULAIR) tablet 10 mg  10 mg Oral QHS Kellsie Grindle, Madie Reno, MD   10 mg at 12/04/17 2107  . multivitamin-lutein (OCUVITE-LUTEIN) capsule 1 capsule  1 capsule Oral Daily Cylus Douville, Madie Reno, MD   1 capsule at 12/05/17 [phone]  . OLANZapine (ZYPREXA) tablet 15 mg  15 mg Oral BID AC & HS Pucilowska, Jolanta B, MD   15 mg at 12/05/17 0812  . temazepam (RESTORIL) capsule 30 mg  30 mg Oral QHS PRN Tekela Garguilo, Madie Reno, MD   30 mg at 12/04/17 2107    Lab Results:  Results for orders placed or performed during the hospital encounter of 12/02/17 (from the past 48 hour(s))  Lithium level     Status: Abnormal   Collection Time: 12/05/17  7:00 AM  Result Value Ref Range   Lithium Lvl 0.50 (L) 0.60 - 1.20 mmol/L    Comment: Performed at [facility], [address] [place], [place] [address]  Basic metabolic panel     Status: Abnormal   Collection Time: 12/05/17  7:00 AM  Result Value Ref Range   Sodium 139 135 - 145 mmol/L   Potassium 3.9 3.5 - 5.1 mmol/L   Chloride 108 101 - 111 mmol/L   CO2 26 22 - 32 mmol/L   Glucose, Bld 105 (H) 65 - 99 mg/dL   BUN 14 6 - 20 mg/dL   Creatinine, Ser 0.75 0.61 - 1.24 mg/dL   Calcium 9.7 8.9 - 10.3 mg/dL   GFR calc non Af Amer >60 >60 mL/min   GFR calc Af Amer >60 >60 mL/min    Comment: (NOTE) The eGFR has been calculated using the CKD EPI equation. This calculation has not been validated in all clinical situations. eGFR's persistently <60 mL/min signify possible Chronic Kidney Disease.    Anion gap 5 5 - 15    Comment: Performed at [facility], [address] [place], [place] [address]    Blood Alcohol level:  Lab Results  Component Value Date   [facility] <10 12/02/2017   ETH <5 38/04/1750    Metabolic Disorder Labs: Lab Results  Component Value Date   HGBA1C 4.7 (L) 12/03/2017   MPG  88.19 12/03/2017   No results found for: PROLACTIN Lab Results  Component Value Date   CHOL 204 (H) 12/03/2017   TRIG 35 12/03/2017   HDL 71 12/03/2017   CHOLHDL 2.9 12/03/2017   VLDL 7 12/03/2017   LDLCALC 126 (H) 12/03/2017   LDLCALC 102 (H) 12/01/2016    Physical Findings: AIMS: Facial and Oral Movements Muscles of Facial Expression: None, normal Lips and Perioral Area: None, normal Jaw: None, normal Tongue: None, normal,Extremity Movements Upper (arms, wrists, hands, fingers): None, normal Lower (legs, knees, ankles, toes): None, normal, Trunk Movements Neck, shoulders, hips: None, normal, Overall Severity Severity of abnormal movements (highest score from questions above): None, normal Incapacitation due to abnormal movements: None, normal Patient's awareness of abnormal movements (rate only patient's report): No Awareness, Dental Status Current problems with teeth and/or dentures?: No Does patient usually wear dentures?: No  CIWA:  CIWA-Ar Total: 2 COWS:  COWS Total Score: 2  Musculoskeletal: Strength & Muscle Tone: within normal limits Gait & Station: normal Patient leans: N/A  Psychiatric Specialty Exam: Physical Exam  Nursing note and vitals reviewed. Constitutional: He appears well-developed and well-nourished.  HENT:  Head: Normocephalic and atraumatic.  Eyes: Pupils are equal, round, and reactive to light. Conjunctivae are normal.  Neck: Normal range of motion.  Cardiovascular: Regular rhythm and normal heart  sounds.  Respiratory: Effort normal. No respiratory distress.  GI: Soft. He exhibits no distension.  Musculoskeletal: Normal range of motion.  Neurological: He is alert.  Skin: Skin is warm and dry.  Psychiatric: His affect is labile and inappropriate. His speech is rapid and/or pressured and tangential. He is agitated. He is not aggressive. Thought content is paranoid and delusional. Cognition and memory are impaired. He expresses impulsivity and  inappropriate judgment.    Review of Systems  Constitutional: Negative.   HENT: Negative.   Eyes: Negative.   Respiratory: Negative.   Cardiovascular: Negative.   Gastrointestinal: Negative.   Musculoskeletal: Negative.   Skin: Negative.   Neurological: Negative.   Psychiatric/Behavioral: Negative for depression, hallucinations, memory loss, substance abuse and suicidal ideas. The patient is nervous/anxious and has insomnia.     Blood pressure (!) 136/96, pulse (!) 116, temperature 98.2 F (36.8 C), temperature source Oral, resp. rate 18, height 6' (1.829 m), weight 68 kg (150 lb), SpO2 99 %.Body mass index is 20.34 kg/m.  General Appearance: Disheveled  Eye Contact:  Fair  Speech:  Garbled and Pressured  Volume:  Increased  Mood:  Anxious, Dysphoric and Irritable  Affect:  Inappropriate and Labile  Thought Process:  Disorganized  Orientation:  Full (Time, Place, and Person)  Thought Content:  Illogical, Delusions and Ideas of Reference:   Delusions  Suicidal Thoughts:  No  Homicidal Thoughts:  No  Memory:  Immediate;   Fair Recent;   Fair Remote;   Fair  Judgement:  Impaired  Insight:  Shallow  Psychomotor Activity:  Decreased  Concentration:  Concentration: Poor  Recall:  [employer] of Knowledge:  Fair  Language:  Fair  Akathisia:  No  Handed:  Right  AIMS (if indicated):     Assets:  Desire for Improvement Housing Resilience  ADL's:  Impaired  Cognition:  Impaired,  Mild  Sleep:  Number of Hours: 7     Treatment Plan Summary: Daily contact with patient to assess and evaluate symptoms and progress in treatment, Medication management and Plan 62 year old man with bipolar disorder currently in a manic phase with hyper religiosity agitation and labile mood and behavior poor insight.  Patient has been compliant with medicine.  Lithium level is now at 0.5.  I will increase his dose from a total of 900 mg/day to a total of 1200 mg/day.  No other change to medicine.   Encourage patient to stay calm sleep well and that with further evaluation we may eventually be able to get him off of the back hallway.  Alethia Berthold, MD 12/05/2017, 1:38 PM

## 2017-12-05 NOTE — Progress Notes (Signed)
Patient is alert and oriented x 2 with periods of confusion to place and situations. Patient is placed on 1:1 for safety no distress noted, sitter and security at bedside. Patient denies SI/HI/AVH, his thoughts are disorganized, speech is less pressured, but tangential. 15 minutes safety checks maintained will continue to monitor.

## 2017-12-05 NOTE — Progress Notes (Addendum)
Received Adam Keller this AM after breakfast, he is calm, but very confused and disorganized . His language is incoherent. He is following directions and no physical aggression. He has been cooperative with his medications and received IM medication this PM related to  beginning agitation and pacing. He has been eating well and taking PO fluids. A sitter is at the bedside and security in the back hall area. He is ambulating from his room the the day room without incident. He is watching the TV briefly throughout the day. 0800 dayroom 0900 dayroom 1000 dayroom 1100 hallway 1200 hallway 1300 dayroom 1400 room 1500 sleeping 1600 room 1700 bathroom 1800 room

## 2017-12-06 NOTE — [facility] Group Notes (Signed)
LCSW Group Therapy Note 12/06/2017 1:15pm  Type of Therapy and Topic: Group Therapy: Feelings Around Returning Home & Establishing a Supportive Framework and Supporting Oneself When Supports Not Available  Participation Level: Did Not Attend  Description of Group:  Patients first processed thoughts and feelings about upcoming discharge. These included fears of upcoming changes, lack of change, new living environments, judgements and expectations from others and overall stigma of mental health issues. The group then discussed the definition of a supportive framework, what that looks and feels like, and how do to discern it from an unhealthy non-supportive network. The group identified different types of supports as well as what to do when your family/friends are less than helpful or unavailable  Therapeutic Goals  1. Patient will identify one healthy supportive network that they can use at discharge. 2. Patient will identify one factor of a supportive framework and how to tell it from an unhealthy network. 3. Patient able to identify one coping skill to use when they do not have positive supports from others. 4. Patient will demonstrate ability to communicate their needs through discussion and/or role plays.  Summary of Patient Progress:  Pt was invited to attend group but chose not to attend. CSW will continue to encourage pt to attend group throughout their admission.    Therapeutic Modalities Cognitive Behavioral Therapy Motivational Interviewing   Maury Groninger  CUEBAS-COLON, LCSW 12/06/2017 9:29 AM

## 2017-12-06 NOTE — Progress Notes (Signed)
[facility] MD Progress Note  12/06/2017 2:28 PM Adam Keller  MRN:  [id number] Subjective: Follow-up 62 year old man with bipolar disorder.  Patient continues to be fairly agitated although not aggressive.  He is on his feet almost constantly.  The sitter's who are with him have been trying to convince him to sit down and try taking a nap.  He talks almost constantly although in a very low tone.  Today he told me that he was learning how to juggle 3 things.  He then however went on to explain to me that 1 of them was the world, 1 of them was Jesus and at that point he became even more disorganized.  Patient's thoughts are hyper religious disorganized and bizarre.  Insight partial at best.  He is compliant with his medicine and eats reasonably well. Principal Problem: Bipolar affective disorder, manic, severe, with psychotic behavior ([place]) Diagnosis:   Patient Active Problem List   Diagnosis Date Noted  . Bipolar affective disorder, current episode manic with psychotic symptoms ([place]) [F31.2] 12/02/2017  . Bipolar affective disorder, manic, severe, with psychotic behavior ([place]) [F31.2] 12/02/2017  . Syncope [R55] 10/01/2017  . Grief [F43.21] 12/01/2016  . Tongue lesion [K14.8] 12/01/2016  . Loss of weight [R63.4] 05/30/2016  . Well adult exam [Z00.00] 05/08/2014  . Hyperglycemia [R73.9] 10/28/2012  . 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 Dissection of carotid artery ([employer]) [I77.71] 06/13/2011  . Left facial pain [R51] 06/09/2011  . 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 PSA, INCREASED [R97.20] 07/10/2010  . ANKLE PAIN [M25.579] 04/08/2010  . Edema [R60.9] 04/08/2010  . Shoulder pain, right [M25.511] 03/06/2010  . Diarrhea [R19.7] 03/28/2009  . HOARSENESS [R49.8] 03/07/2009  . Hypothyroidism [E03.9] 08/10/2007  . Dyslipidemia [E78.5] 08/10/2007  . Bipolar depression ([place]) [F31.9] 08/10/2007  . Allergic rhinitis [J30.9] 08/10/2007  . BPH (benign  prostatic hyperplasia) [N40.0] 08/10/2007   Total Time spent with patient: 30 minutes  Past Psychiatric History: History of bipolar disorder multiple hospitalizations recent decompensation off of his medicine  Past Medical History:  Past Medical History:  Diagnosis Date  . Allergic rhinitis   . BPH (benign prostatic hyperplasia)    elev PSA resolved after abx 2008 Dr Diona Fanti (PSA 5.15 in 8/09)  . Depression    h/o bipolar Dr Letitia Neri at [facility]  . Dissection of carotid artery ([place]) 06/13/2011  . Elevated glucose   . Headache(784.0)   . Horner's syndrome   . Hyperlipidemia   . Hypothyroidism     Past Surgical History:  Procedure Laterality Date  . PROSTATE BIOPSY    . Vocal Cord Surgery  2011   implants   Family History:  Family History  Problem Relation Age of Onset  . Heart disease Mother        CHF  . Colon cancer Mother   . Diabetes Mother   . Hyperlipidemia Mother   . Heart disease Father        CHF  . Hypertension Other    Family Psychiatric  History: Unknown Social History:  Social History   Substance and Sexual Activity  Alcohol Use No     Social History   Substance and Sexual Activity  Drug Use No    Social History   Socioeconomic History  . Marital status: Widowed    Spouse name: Not on file  . Number of children: Not on file  . Years of education: Not on file  . Highest education level: Not  on file  Occupational History  . Not on file  Social Needs  . Financial resource strain: Not on file  . Food insecurity:    Worry: Not on file    Inability: Not on file  . Transportation needs:    Medical: Not on file    Non-medical: Not on file  Tobacco Use  . Smoking status: Former Smoker    Types: Cigarettes, Cigars    Last attempt to quit: 07/28/2008    Years since quitting: 9.3  . Smokeless tobacco: Never Used  Substance and Sexual Activity  . Alcohol use: No  . Drug use: No  . Sexual activity: Not Currently  Lifestyle  . Physical activity:     Days per week: Not on file    Minutes per session: Not on file  . Stress: Not on file  Relationships  . Social connections:    Talks on phone: Not on file    Gets together: Not on file    Attends religious service: Not on file    Active member of club or organization: Not on file    Attends meetings of clubs or organizations: Not on file    Relationship status: Not on file  Other Topics Concern  . Not on file  Social History Narrative   Regular Exercise - yes treadmill            Additional Social History:                         Sleep: Fair  Appetite:  Fair  Current Medications: Current Facility-Administered Medications  Medication Dose Route Frequency Provider Last Rate Last Dose  . acetaminophen (TYLENOL) tablet 650 mg  650 mg Oral Q6H PRN Kaitlyn Skowron T, MD      . alum & mag hydroxide-simeth (MAALOX/MYLANTA) 200-200-20 MG/5ML suspension 30 mL  30 mL Oral Q4H PRN Suresh Audi T, MD      . aspirin EC tablet 81 mg  81 mg Oral Daily Taylon Louison, Madie Reno, MD   81 mg at 12/06/17 0919  . colesevelam [facility]) tablet 625 mg  625 mg Oral Q breakfast Janicia Monterrosa, Madie Reno, MD   625 mg at 12/06/17 0917  . diphenhydrAMINE (BENADRYL) capsule 50 mg  50 mg Oral Q6H PRN Pucilowska, Jolanta B, MD   50 mg at 12/06/17 0411   Or  . diphenhydrAMINE (BENADRYL) injection 50 mg  50 mg Intramuscular Q6H PRN Pucilowska, Jolanta B, MD   50 mg at 12/06/17 1351  . haloperidol (HALDOL) tablet 10 mg  10 mg Oral Q6H PRN Pucilowska, Jolanta B, MD   10 mg at 12/06/17 0411   Or  . haloperidol lactate (HALDOL) injection 10 mg  10 mg Intramuscular Q6H PRN Pucilowska, Jolanta B, MD   10 mg at 12/06/17 1351  . hydrOXYzine (ATARAX/VISTARIL) tablet 50 mg  50 mg Oral TID PRN Roderick Sweezy, Madie Reno, MD   50 mg at 12/05/17 2216  . lamoTRIgine (LAMICTAL) tablet 200 mg  200 mg Oral Daily Paiten Boies, Madie Reno, MD   200 mg at 12/06/17 0919  . levothyroxine (SYNTHROID, LEVOTHROID) tablet 50 mcg  50 mcg Oral QAC breakfast  Kirbie Stodghill, Madie Reno, MD   50 mcg at 12/06/17 0919  . lithium carbonate capsule 600 mg  600 mg Oral BID WC Deaaron Fulghum, Madie Reno, MD   600 mg at 12/06/17 7741  . LORazepam (ATIVAN) tablet 2 mg  2 mg Oral Q6H PRN Pucilowska, Wardell Honour, MD  2 mg at 12/06/17 0411   Or  . LORazepam (ATIVAN) injection 2 mg  2 mg Intramuscular Q6H PRN Pucilowska, Jolanta B, MD   2 mg at 12/05/17 1623  . magnesium hydroxide (MILK OF MAGNESIA) suspension 30 mL  30 mL Oral Daily PRN ,  T, MD      . montelukast (SINGULAIR) tablet 10 mg  10 mg Oral QHS ,  T, MD   10 mg at 12/05/17 2215  . multivitamin-lutein (OCUVITE-LUTEIN) capsule 1 capsule  1 capsule Oral Daily , Madie Reno, MD   1 capsule at 12/06/17 0917  . OLANZapine (ZYPREXA) tablet 15 mg  15 mg Oral BID AC & HS Pucilowska, Jolanta B, MD   15 mg at 12/06/17 0917  . temazepam (RESTORIL) capsule 30 mg  30 mg Oral QHS PRN , Madie Reno, MD   30 mg at 12/05/17 2216    Lab Results:  Results for orders placed or performed during the hospital encounter of 12/02/17 (from the past 48 hour(s))  Lithium level     Status: Abnormal   Collection Time: 12/05/17  7:00 AM  Result Value Ref Range   Lithium Lvl 0.50 (L) 0.60 - 1.20 mmol/L    Comment: Performed at [facility], [address] [place], [place] [address]  Basic metabolic panel     Status: Abnormal   Collection Time: 12/05/17  7:00 AM  Result Value Ref Range   Sodium 139 135 - 145 mmol/L   Potassium 3.9 3.5 - 5.1 mmol/L   Chloride 108 101 - 111 mmol/L   CO2 26 22 - 32 mmol/L   Glucose, Bld 105 (H) 65 - 99 mg/dL   BUN 14 6 - 20 mg/dL   Creatinine, Ser 0.75 0.61 - 1.24 mg/dL   Calcium 9.7 8.9 - 10.3 mg/dL   GFR calc non Af Amer >60 >60 mL/min   GFR calc Af Amer >60 >60 mL/min    Comment: (NOTE) The eGFR has been calculated using the CKD EPI equation. This calculation has not been validated in all clinical situations. eGFR's persistently <60 mL/min signify possible Chronic  Kidney Disease.    Anion gap 5 5 - 15    Comment: Performed at [facility], [address] [place], [place] [address]    Blood Alcohol level:  Lab Results  Component Value Date   [facility] <10 12/02/2017   ETH <5 66/12/3014    Metabolic Disorder Labs: Lab Results  Component Value Date   HGBA1C 4.7 (L) 12/03/2017   MPG 88.19 12/03/2017   No results found for: PROLACTIN Lab Results  Component Value Date   CHOL 204 (H) 12/03/2017   TRIG 35 12/03/2017   HDL 71 12/03/2017   CHOLHDL 2.9 12/03/2017   VLDL 7 12/03/2017   LDLCALC 126 (H) 12/03/2017   LDLCALC 102 (H) 12/01/2016    Physical Findings: AIMS: Facial and Oral Movements Muscles of Facial Expression: None, normal Lips and Perioral Area: None, normal Jaw: None, normal Tongue: None, normal,Extremity Movements Upper (arms, wrists, hands, fingers): None, normal Lower (legs, knees, ankles, toes): None, normal, Trunk Movements Neck, shoulders, hips: None, normal, Overall Severity Severity of abnormal movements (highest score from questions above): None, normal Incapacitation due to abnormal movements: None, normal Patient's awareness of abnormal movements (rate only patient's report): No Awareness, Dental Status Current problems with teeth and/or dentures?: No Does patient usually wear dentures?: No  CIWA:  CIWA-Ar Total: 2 COWS:  COWS Total Score: 2  Musculoskeletal: Strength &  Muscle Tone: within normal limits Gait & Station: normal Patient leans: N/A  Psychiatric Specialty Exam: Physical Exam  Nursing note and vitals reviewed. Constitutional: He appears well-developed and well-nourished.  HENT:  Head: Normocephalic and atraumatic.  Eyes: Pupils are equal, round, and reactive to light. Conjunctivae are normal.  Neck: Normal range of motion.  Cardiovascular: Regular rhythm and normal heart sounds.  Respiratory: Effort normal. No respiratory distress.  GI: Soft.  Musculoskeletal: Normal range of  motion.  Neurological: He is alert.  Skin: Skin is warm and dry.  Psychiatric: His affect is labile. His speech is tangential. He is agitated. He is not aggressive. Thought content is delusional. Cognition and memory are impaired. He expresses inappropriate judgment. He is noncommunicative.    Review of Systems  Constitutional: Negative.   HENT: Negative.   Eyes: Negative.   Respiratory: Negative.   Cardiovascular: Negative.   Gastrointestinal: Negative.   Musculoskeletal: Negative.   Skin: Negative.   Neurological: Negative.   Psychiatric/Behavioral: Positive for hallucinations and memory loss. Negative for depression, substance abuse and suicidal ideas. The patient is nervous/anxious and has insomnia.     Blood pressure 125/72, pulse 75, temperature 98.2 F (36.8 C), temperature source Oral, resp. rate 18, height 6' (1.829 m), weight 68 kg (150 lb), SpO2 98 %.Body mass index is 20.34 kg/m.  General Appearance: Disheveled  Eye Contact:  Fair  Speech:  Garbled  Volume:  Increased  Mood:  Euthymic  Affect:  Constricted and Inappropriate  Thought Process:  Disorganized  Orientation:  Full (Time, Place, and Person)  Thought Content:  Illogical, Delusions, Rumination and Tangential  Suicidal Thoughts:  No  Homicidal Thoughts:  No  Memory:  Immediate;   Fair Recent;   Poor Remote;   Fair  Judgement:  Impaired  Insight:  Lacking  Psychomotor Activity:  Decreased  Concentration:  Concentration: Fair  Recall:  [employer] of Knowledge:  Fair  Language:  Fair  Akathisia:  No  Handed:  Right  AIMS (if indicated):     Assets:  Desire for Improvement Housing Physical Health  ADL's:  Impaired  Cognition:  Impaired,  Mild  Sleep:  Number of Hours: 3.75     Treatment Plan Summary: Daily contact with patient to assess and evaluate symptoms and progress in treatment, Medication management and Plan Patient is on highest dose of Zyprexa and I increased his lithium yesterday.  No  sign of any side effects.  He remains agitated confused and psychotic although he has not been physically aggressive.  Nursing continues to feel better with him staying in the back hallway while he remains somewhat agitated.  Encourage patient to try and get some rest and sleep well at night.  No other change to treatment plan.  Alethia Berthold, MD 12/06/2017, 2:28 PM

## 2017-12-06 NOTE — Progress Notes (Addendum)
Received Markeise this AM in the rear hallway, he remains verbally and cognitively disorganized. He was compliant with his medications with encouragement. He is eating and drinking adequately. He is napping at intervals throughout the day and thus far no behavioral issues.  He continues with the same behavior this PM. He remains in the back hallway with a sitter and security.  0800 sleeping 0900 hallway 1000 dayroom 1100 dayroom 1200 sleeping 1300 hallway 1400 dayroom 1500 hallway 1600 sleeping 1700 dayroom 1800 hallway

## 2017-12-06 NOTE — Plan of Care (Addendum)
Problem: Coping: Goal: Coping ability will improve Outcome: Progressing   Problem: Safety: Goal: Ability to remain free from injury will improve Outcome: Progressing   Problem: Health Behavior/Discharge Planning: Goal: Compliance with therapeutic regimen will improve Outcome: Progressing   Problem: Coping: Goal: Level of anxiety will decrease Outcome: Progressing   Problem: Coping: Goal: Will verbalize feelings Outcome: Not Progressing   Problem: Safety: Goal: Ability to redirect hostility and anger into socially appropriate behaviors will improve Outcome: Not Progressing   Problem: Education: Goal: Knowledge of the prescribed therapeutic regimen will improve Outcome: Not Progressing

## 2017-12-06 NOTE — Progress Notes (Addendum)
2000- Patient walking in hallway. Wandering in and out of back day room. Security and MHT present. Patient is confused and mumbling to himself. Responding to internal stimuli. 2100- Patient remains confused and disoriented. Behavior is bizarre. Patient is labile. Yells at staff periodically. Behavior is menacing. MHT present. Security guard left floor while asleep.  2200- Patient compliant with HS medications. PRN Vistaril, Haldol, Benadryl, and Ativan administered PO. Patient falls asleep almost immediately. 2300- Patient is awake and floridly psychotic. Security guard returns. Behavior bizzare. Walking as if on a tightrope. Feeling the walls and the floor. Stripped bed after attempting to tie corners of sheet into knots. MHT attempts to redirect with no success. Patient attempts to urinate in back TV room. Yells at MHT when redirected. MHT and security present.  0000- Remains awake and hallucinating. MHT remade bed and patient laid down only momentarily. Behavior continues to be bizarre. MHT present.  0100- Patient is sleeping at this time. MHT present. 0200- Patient slept less than an hour. Remains restless and difficult to redirect. Continues to mumble incoherently. Refuses to lie down. PRNs cannot be given for 2 more hours. MHT present.  0300- Remains agitated. Removed clothing and is attempting to close the door in the face of staff. Patient is refusing redirection. Not following direction. Continues to respond to internal stimuli. MHT present.  0400- Remains agitated. Given Haldol, Benadryl, and Ativan PRN. Effective at 0445. Patient sleeping. MHT present. 0500- Remains asleep. MHT present. 0600- Patient remains asleep. VSS. MHT present. 0700- Remains asleep. MHT present.

## 2017-12-06 NOTE — Progress Notes (Addendum)
Patient found in back green hallway upon my arrival. Contacted Dr. Weber Cooks for renewal of observation order. Patient remains confused and disoriented but less irritable. Remains difficult to redirect and restless. Remains religious and preoccupied. Patient unable to appropriately answer assessment questions but observed responding to internal stimuli. Behavior less bizarre and ritualistic. Speech remains tangential but more clear, less garbled. Appetite is ravenous. Is now able to ask for what he wants more appropriately. Compliant with HS medications. Given Haldol and Benadryl PO for restlessness and agitation. Given Restoril for sleep. Vistaril for agitation. All with positive results thus far. Patient would not go to bed when prompted but did go on his own several minutes later. Patient is sleeping @1045 . MHT present. Will continue to monitor throughout the shift.  @2330 , Patient is awake, banging on furniture and walls in his room. MHT Mining engineer. Upon approach, patient is menacing and tells me that if I try to give him a shot, he will punish me. Was able to redirect the patient to sit on his bed. Patient began a tirade of hyper-religiosity regarding who he is and who is "real father" is and that he, Jesus will make sure that the end is near for me. Patient allowed me to give the injection without resistance but states that "when I die in my sleep, you will suffer 10-fold the punishment of God." Patient given Ativan 2mg  IM. MHT present. Patient slept approximately 45 minutes and then began to move about the room, unsteady on his feet. MHT unable to redirect patient. Patient is more compliant with writer and I was able to get patient to lie down after urinating in toilet and washing his hands. When asking patient if there was anything writer could do to help him sleep. Patient replies, "Bring on the Rapture."  2000- Patient is up in hallway, pacing and restless. Requests and receives snack. Patient is  more organized but remains tangential and disoriented. MHT present. 2100- Patient eating another snack. Compliant with HS medications. Refuses redirection to go to bed but is not irritable or yelling. MHT present. 2200- Patient is in bed awake. Appears drowsy. Falls asleep @ approximately 2215. MHT present.  2300- Patient sleeping. MHT present. 2330- Patient is awake, banging on furniture and walls in his room. MHT Mining engineer. Upon approach, patient is menacing and tells me that if I try to give him a shot, he will punish me. Was able to redirect the patient to sit on his bed. Patient began a tirade of hyper-religiosity regarding who he is and who is "real father" is and that he, Jesus will make sure that the end is near for me. Patient allowed me to give the injection without resistance but states that "when I die in my sleep, you will suffer 10-fold the punishment of God. Patient given Ativan 2mg  IM. MHT present. Patient slept 5 hours total. Restless throughout the night. Will endorse care to oncoming shift.  0000- Remains awake and naked. Resistant to redirection.  0100- Patient finally agreed to lie down. RN sat at bedside until patient fell asleep. @ 0125, patient awake again and walking around room. Patient urinated in toilet and washed hands. Continues to be unsteady and was assisted to bed. MHT remains present with patient in bed @ 0130. 0200- Patient remains asleep. No apparent distress. MHT present. 0300- Patient remains asleep. No apparent distress. MHT present. 0400- Patient remains asleep. No apparent distress. MHT Present. 0500- Patient remains asleep. No apparent distress. MHT present. 0600- Patient  compliant with am VS. VSS. Requested patient rise slowly. Patient compliant. Patient dressed and is working on Nurse, mental health. Movements are slow but patient is more compliant with safety measures. MHT present. 0700- Continues to work on Nurse, mental health. MHT present.
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
## 2017-12-08 MED ORDER — TEMAZEPAM 15 MG PO CAPS
30.0000 mg | ORAL_CAPSULE | Freq: Every day | ORAL | Status: DC
  Administered 2017-12-08 – 2017-12-10 (×3): 30 mg via ORAL
  Filled 2017-12-08 (×3): qty 2

## 2017-12-08 MED ORDER — LAMOTRIGINE 100 MG PO TABS
100.0000 mg | ORAL_TABLET | Freq: Every day | ORAL | Status: DC
  Administered 2017-12-09 – 2017-12-13 (×5): 100 mg via ORAL
  Filled 2017-12-08 (×5): qty 1

## 2017-12-08 MED ORDER — DIVALPROEX SODIUM 500 MG PO DR TAB: 500.0000 mg | DELAYED_RELEASE_TABLET | Freq: Three times a day (TID) | ORAL | Status: DC

## 2017-12-08 MED ORDER — DIVALPROEX SODIUM 500 MG PO DR TAB
500.0000 mg | DELAYED_RELEASE_TABLET | Freq: Two times a day (BID) | ORAL | Status: DC
  Administered 2017-12-08 – 2017-12-11 (×7): 500 mg via ORAL
  Filled 2017-12-08 (×7): qty 1

## 2017-12-08 NOTE — Progress Notes (Signed)
Patient is eating lunch in day room on the back hall. Patient filled out his patient survey form and wrote down for the question Is there anything else you would like staff or questions you have? Patient wrote in "I am Jesus." Patient rates depression 1/10, and shoulder pain 1/10; anxiety 0/10, and hopelessness 0/10.  Patient has no distress noted, RN will continue to monitor. Security and MHT present.

## 2017-12-08 NOTE — Progress Notes (Signed)
Patient is alert, denies SI, HI and AVH this morning. Patient continues to be on 1:1 due to aggressive behaviors. Patient ate breakfast and took morning medications except for zyprexia due to missing from pyxis, nurse did message pharmacy about missing dose. Patient is now standing in the hall way talking with staff. There is no distress noted. Nurse will continue to monitor.

## 2017-12-08 NOTE — [facility] Group Notes (Signed)
12/08/2017 1PM  Type of Therapy/Topic:  Group Therapy:  Feelings about Diagnosis  Participation Level:  Did Not Attend   Description of Group:   This group will allow patients to explore their thoughts and feelings about diagnoses they have received. Patients will be guided to explore their level of understanding and acceptance of these diagnoses. Facilitator will encourage patients to process their thoughts and feelings about the reactions of others to their diagnosis and will guide patients in identifying ways to discuss their diagnosis with significant others in their lives. This group will be process-oriented, with patients participating in exploration of their own experiences, giving and receiving support, and processing challenge from other group members.   Therapeutic Goals: 1. Patient will demonstrate understanding of diagnosis as evidenced by identifying two or more symptoms of the disorder 2. Patient will be able to express two feelings regarding the diagnosis 3. Patient will demonstrate their ability to communicate their needs through discussion and/or role play  Summary of Patient Progress: Patient was encouraged and invited to attend group. Patient did not attend group. Social worker will continue to encourage group participation in the future.        Therapeutic Modalities:   Cognitive Behavioral Therapy Brief Therapy Feelings Identification    Darin Engels, Marlinda Mike 12/08/2017 2:29 PM

## 2017-12-08 NOTE — Progress Notes (Signed)
Patient standing in back hallway talking to staff. Patient has been compliant with medications, continues to deny SI, HI and AVH. No distress noted. Security and MHT present. Nurse will continue to monitor.
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
## 2017-12-08 NOTE — Progress Notes (Signed)
Patient sitting in Dayroom on the back hall watching television. MHT and Animal nutritionist present. No distress observed RN will continue to monitor.

## 2017-12-08 NOTE — Progress Notes (Signed)
Patient laying down in bed awake, no distress noted, breaths are even and unlabored. Security and MHT present, RN will continue to monitor.

## 2017-12-08 NOTE — Plan of Care (Signed)
Patient remains resistant to care but compliant with medications. Remains religiously preoccupied and continues to believe he is Jesus. Patient is agitated at times and reports needing no sleep.   Problem: Coping: Goal: Coping ability will improve Outcome: Progressing Goal: Will verbalize feelings Outcome: Progressing   Problem: Safety: Goal: Ability to remain free from injury will improve Outcome: Progressing   Problem: Health Behavior/Discharge Planning: Goal: Compliance with therapeutic regimen will improve Outcome: Progressing   Problem: Coping: Goal: Ability to use eye contact when communicating with others will improve Outcome: Progressing   Problem: Safety: Goal: Ability to redirect hostility and anger into socially appropriate behaviors will improve Outcome: Not Progressing   Problem: Education: Goal: Utilization of techniques to improve thought processes will improve Outcome: Not Progressing Goal: Knowledge of the prescribed therapeutic regimen will improve Outcome: Not Progressing   Problem: Health Behavior/Discharge Planning: Goal: Ability to make decisions will improve Outcome: Not Progressing   Problem: Coping: Goal: Ability to interact with others will improve Outcome: Not Progressing Goal: Demonstration of participation in decision-making regarding own care will improve Outcome: Not Progressing

## 2017-12-08 NOTE — Progress Notes (Addendum)
Patient found in hallway talking with staff upon my arrival. Patient is visible and social with staff this evening. Remains delusional, continues to believe he is Bricelyn he is responsible for choosing those who go to heaven.  Denies need for sleep. Compliant with HS medications. Patient is agitated and fidgety. Given Haldol, Benadryl, and Ativan PO for agitation. Given Restoril for sleep. Patient went to sleep for about 20 minutes but rose again stating he does not need sleep. Patient goes in and out of his room all evening after medication administration. Continues to be awake @2330 . Patient remains disorganized and unable to process information. Patient does not answer assessment questions appropriately. Patient has not been observed responding to internal stimuli but will not deny or endorse AVH. Does not endorse SI/HI. Sitter present with security guard. Will continue to monitor. Patient slept 7 hours. No apparent distress. Compliant with Synthroid. Will endorse care to oncoming shift.  2000- Patient awake sitting in hall talking with MHT and security guard. Remains religiously delusional.  2100- Patient compliant with HS medication and given Haldol, Ativan, and Benadryl PRN PO for psychomotor agitation and religiousity. Given Restoril for sleep. Remains awake @ 2140. MHT and security present. 2200- Patient laid down for 20 minutes and then got up again. Patient reports not needing sleep because he is Jesus and must decide who goes to [place]. MHT and security present. 2300- Patient is moving from bed to hallway frequently. Remains restless, fidgety, and resistant to sleep. MHT and security present.  0000- Patient sleeping at this time. MHT present.  0100- Patient sleeping. No apparent distress. MHT present. 0200- Patient sleeping. No apparent distress. MHT Present. 0300- Patient sleeping. No apparent distress. MHT present. 0400- Patient sleeping. No apparent distress. MHT  present. 0500- Patient woke briefly and was redirected back to bed. Patient complied. MHT present. 0600- Compliant with am vitals and synthroid. Patient is pleasant but religiously preoccupied. States, "Are you ready to meet Jesus?" MHT present. 0700- Remains awake. Requests and is given ice water. Patient currently sitting with staff talking. MHT and security present.

## 2017-12-08 NOTE — Progress Notes (Signed)
Recreation Therapy Notes  Date: 12/08/2017  Time: 9:30 am   Location: Craft Room   Behavioral response: N/A   Intervention Topic:  Coping Skills  Discussion/Intervention: Patient did not attend group.   Clinical Observations/Feedback:  Patient did not attend group.   Adam Keller LRT/CTRS        Adam Keller 12/08/2017 10:30 AM

## 2017-12-09 MED ORDER — HYDROCORTISONE 1 % EX CREA
TOPICAL_CREAM | Freq: Two times a day (BID) | CUTANEOUS | Status: DC
  Administered 2017-12-09 – 2017-12-13 (×8): via TOPICAL
  Filled 2017-12-09: qty 28

## 2017-12-09 NOTE — Progress Notes (Signed)
Patient verbalized "I feel happy."Patient appropriate behavior with 1:1.

## 2017-12-09 NOTE — Progress Notes (Signed)
Patient appropriate behavior with 1:1. 

## 2017-12-09 NOTE — Plan of Care (Signed)
Patient is calm and cooperative with staff & peers today.Patient could hold on logical conversation and ends up into religious talk.At the end of the shift patient was hyper verbal and little intrusive with other patients.PRN medications given.Compliant with medications.Patient verbalized that he enjoyed outside.Denies SI,HI and AVH.Appetite and energy level good.1:1 sitter with patient.

## 2017-12-09 NOTE — [facility] Group Notes (Signed)
LCSW Group Therapy Note  12/09/2017 1:00 pm  Type of Therapy/Topic:  Group Therapy:  Emotion Regulation  Participation Level:  Did Not Attend   Description of Group:    The purpose of this group is to assist patients in learning to regulate negative emotions and experience positive emotions. Patients will be guided to discuss ways in which they have been vulnerable to their negative emotions. These vulnerabilities will be juxtaposed with experiences of positive emotions or situations, and patients will be challenged to use positive emotions to combat negative ones. Special emphasis will be placed on coping with negative emotions in conflict situations, and patients will process healthy conflict resolution skills.  Therapeutic Goals: 1. Patient will identify two positive emotions or experiences to reflect on in order to balance out negative emotions 2. Patient will label two or more emotions that they find the most difficult to experience 3. Patient will demonstrate positive conflict resolution skills through discussion and/or role plays  Summary of Patient Progress: Adam Keller was invited to today's group, but chose not to attend.     Therapeutic Modalities:   Cognitive Behavioral Therapy Feelings Identification Dialectical Behavioral Therapy

## 2017-12-09 NOTE — Progress Notes (Signed)
Patient off from security.Patient out in the milieu with sitter.No aggressive behaviors noted.

## 2017-12-09 NOTE — Progress Notes (Signed)
Compliant with medications.Appropriate with sitter and security.
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
## 2017-12-09 NOTE — Tx Team (Signed)
Interdisciplinary Treatment and Diagnostic Plan Update  12/09/2017 Time of Session: 10:50 AM Adam Keller MRN: [id number]  Principal Diagnosis: Bipolar affective disorder, manic, severe, with psychotic behavior ([place])  Secondary Diagnoses: Principal Problem:   Bipolar affective disorder, manic, severe, with psychotic behavior ([place]) Active Problems:   Hypothyroidism   Dyslipidemia   Current Medications:  Current Facility-Administered Medications  Medication Dose Route Frequency Provider Last Rate Last Dose  . acetaminophen (TYLENOL) tablet 650 mg  650 mg Oral Q6H PRN Clapacs, John T, MD      . alum & mag hydroxide-simeth (MAALOX/MYLANTA) 200-200-20 MG/5ML suspension 30 mL  30 mL Oral Q4H PRN Clapacs, John T, MD      . colesevelam [facility]) tablet 625 mg  625 mg Oral Q breakfast Clapacs, Madie Reno, MD   625 mg at 12/09/17 0745  . diphenhydrAMINE (BENADRYL) capsule 50 mg  50 mg Oral Q6H PRN Pucilowska, Jolanta B, MD   50 mg at 12/08/17 2051   Or  . diphenhydrAMINE (BENADRYL) injection 50 mg  50 mg Intramuscular Q6H PRN Pucilowska, Jolanta B, MD   50 mg at 12/06/17 1351  . divalproex (DEPAKOTE) DR tablet 500 mg  500 mg Oral Q12H Pucilowska, Jolanta B, MD   500 mg at 12/09/17 0744  . haloperidol (HALDOL) tablet 10 mg  10 mg Oral Q6H PRN Pucilowska, Jolanta B, MD   10 mg at 12/08/17 2051   Or  . haloperidol lactate (HALDOL) injection 10 mg  10 mg Intramuscular Q6H PRN Pucilowska, Jolanta B, MD   10 mg at 12/06/17 1351  . hydrocortisone cream 1 %   Topical BID Pucilowska, Jolanta B, MD      . hydrOXYzine (ATARAX/VISTARIL) tablet 50 mg  50 mg Oral TID PRN Clapacs, Madie Reno, MD   50 mg at 12/06/17 2104  . lamoTRIgine (LAMICTAL) tablet 100 mg  100 mg Oral Daily Pucilowska, Jolanta B, MD   100 mg at 12/09/17 0744  . levothyroxine (SYNTHROID, LEVOTHROID) tablet 50 mcg  50 mcg Oral QAC breakfast Clapacs, Madie Reno, MD   50 mcg at 12/09/17 0617  . lithium carbonate capsule 600 mg  600 mg Oral BID WC  Clapacs, Madie Reno, MD   600 mg at 12/09/17 0744  . LORazepam (ATIVAN) tablet 2 mg  2 mg Oral Q6H PRN Pucilowska, Jolanta B, MD   2 mg at 12/08/17 2051   Or  . LORazepam (ATIVAN) injection 2 mg  2 mg Intramuscular Q6H PRN Pucilowska, Jolanta B, MD   2 mg at 12/06/17 2340  . magnesium hydroxide (MILK OF MAGNESIA) suspension 30 mL  30 mL Oral Daily PRN Clapacs, John T, MD      . montelukast (SINGULAIR) tablet 10 mg  10 mg Oral QHS Clapacs, Madie Reno, MD   10 mg at 12/08/17 2049  . multivitamin-lutein (OCUVITE-LUTEIN) capsule 1 capsule  1 capsule Oral Daily Clapacs, Madie Reno, MD   1 capsule at 12/09/17 0744  . OLANZapine (ZYPREXA) tablet 15 mg  15 mg Oral BID AC & HS Pucilowska, Jolanta B, MD   15 mg at 12/09/17 0746  . temazepam (RESTORIL) capsule 30 mg  30 mg Oral QHS Pucilowska, Jolanta B, MD   30 mg at 12/08/17 2049   PTA Medications: Medications Prior to Admission  Medication Sig Dispense Refill Last Dose  . amantadine (SYMMETREL) 100 MG capsule Take 100 mg by mouth daily.     Taking  . aspirin 81 MG EC tablet Take 1 tablet (81 mg  total) by mouth 2 (two) times daily. 100 tablet 5 Taking  . Cholecalciferol 1000 UNITS tablet Take 1,000 Units by mouth daily.     Taking  . colesevelam (WELCHOL) 625 MG tablet Take 625-1,250 mg by mouth as needed. For diarrhea    Taking  . dutasteride (AVODART) 0.5 MG capsule Take 0.5 mg by mouth every other day.     Taking  . fish oil-omega-3 fatty acids 1000 MG capsule Take 1 g by mouth daily.     Taking  . lamoTRIgine (LAMICTAL) 200 MG tablet Take 200 mg by mouth at bedtime.     Taking  . levothyroxine (SYNTHROID) 50 MCG tablet Take 1.5 tablets (75 mcg total) by mouth daily before breakfast. 135 tablet 3   . lithium carbonate 300 MG capsule Take by mouth.   Taking  . Lutein 20 MG TABS Take 1 tablet by mouth daily. Reported on 11/29/2015   Taking  . meloxicam (MOBIC) 7.5 MG tablet Take 1 tablet (7.5 mg total) by mouth daily as needed for pain. 30 tablet 0   . mometasone  (NASONEX) 50 MCG/ACT nasal spray Place 2 sprays into the nose daily. 51 g 3 Taking  . montelukast (SINGULAIR) 10 MG tablet Take 1 tablet (10 mg total) by mouth daily. Yearly physical due in May must see MD for future refills 90 tablet 3 Taking  . Multiple Vitamins-Minerals (MULTIVITAMIN,TX-MINERALS) tablet Take 1 tablet by mouth daily.     Taking  . OLANZapine (ZYPREXA) 10 MG tablet Take by mouth.   Taking  . omeprazole (PRILOSEC) 40 MG capsule Take 1 capsule (40 mg total) by mouth daily as needed. 30 capsule 2   . Selenium 200 MCG CAPS Take 1 capsule by mouth daily. Reported on 11/29/2015   Taking  . zaleplon (SONATA) 5 MG capsule Take 5 mg by mouth at bedtime.     Taking    Patient Stressors: Financial difficulties Health problems Medication change or noncompliance Occupational concerns  Patient Strengths: Capable of independent living Curator fund of knowledge Motivation for treatment/growth  Treatment Modalities: Medication Management, Group therapy, Case management,  1 to 1 session with clinician, Psychoeducation, Recreational therapy.   Physician Treatment Plan for Primary Diagnosis: Bipolar affective disorder, manic, severe, with psychotic behavior ([place]) Long Term Goal(s): Improvement in symptoms so as ready for discharge NA   Short Term Goals: Ability to identify changes in lifestyle to reduce recurrence of condition will improve Ability to verbalize feelings will improve Ability to disclose and discuss suicidal ideas Ability to demonstrate self-control will improve Ability to identify and develop effective coping behaviors will improve Ability to maintain clinical measurements within normal limits will improve Compliance with prescribed medications will improve Ability to identify triggers associated with substance abuse/mental health issues will improve NA  Medication Management: Evaluate patient's response, side effects, and tolerance of medication  regimen.  Therapeutic Interventions: 1 to 1 sessions, Unit Group sessions and Medication administration.  Evaluation of Outcomes: Progressing  Physician Treatment Plan for Secondary Diagnosis: Principal Problem:   Bipolar affective disorder, manic, severe, with psychotic behavior ([place]) Active Problems:   Hypothyroidism   Dyslipidemia  Long Term Goal(s): Improvement in symptoms so as ready for discharge NA   Short Term Goals: Ability to identify changes in lifestyle to reduce recurrence of condition will improve Ability to verbalize feelings will improve Ability to disclose and discuss suicidal ideas Ability to demonstrate self-control will improve Ability to identify and develop effective coping behaviors will improve Ability  to maintain clinical measurements within normal limits will improve Compliance with prescribed medications will improve Ability to identify triggers associated with substance abuse/mental health issues will improve NA     Medication Management: Evaluate patient's response, side effects, and tolerance of medication regimen.  Therapeutic Interventions: 1 to 1 sessions, Unit Group sessions and Medication administration.  Evaluation of Outcomes: Progressing   RN Treatment Plan for Primary Diagnosis: Bipolar affective disorder, manic, severe, with psychotic behavior ([place]) Long Term Goal(s): Knowledge of disease and therapeutic regimen to maintain health will improve  Short Term Goals: Ability to remain free from injury will improve, Ability to demonstrate self-control and Compliance with prescribed medications will improve  Medication Management: RN will administer medications as ordered by provider, will assess and evaluate patient's response and provide education to patient for prescribed medication. RN will report any adverse and/or side effects to prescribing provider.  Therapeutic Interventions: 1 on 1 counseling sessions, Psychoeducation, Medication  administration, Evaluate responses to treatment, Monitor vital signs and CBGs as ordered, Perform/monitor CIWA, COWS, AIMS and Fall Risk screenings as ordered, Perform wound care treatments as ordered.  Evaluation of Outcomes: Progressing   LCSW Treatment Plan for Primary Diagnosis: Bipolar affective disorder, manic, severe, with psychotic behavior ([place]) Long Term Goal(s): Safe transition to appropriate next level of care at discharge, Engage patient in therapeutic group addressing interpersonal concerns.  Short Term Goals: Engage patient in aftercare planning with referrals and resources and Increase skills for wellness and recovery  Therapeutic Interventions: Assess for all discharge needs, 1 to 1 time with Social worker, Explore available resources and support systems, Assess for adequacy in community support network, Educate family and significant other(s) on suicide prevention, Complete Psychosocial Assessment, Interpersonal group therapy.  Evaluation of Outcomes: Progressing   Progress in Treatment: Attending groups: No. Participating in groups: No. Taking medication as prescribed: Yes. Toleration medication: Yes. Family/Significant other contact made: No, will contact:  Pt did not give consent to CSW to contact any family or support person. Patient understands diagnosis: No. Discussing patient identified problems/goals with staff: Yes. Medical problems stabilized or resolved: Yes. Denies suicidal/homicidal ideation: Yes. Issues/concerns per patient self-inventory: No. Other: n/a  New problem(s) identified: No, Describe:  No new problems identified.  New Short Term/Long Term Goal(s):  Patient Goals:  "To get out of here as soon as possible"  Discharge Plan or Barriers: Discharge plan continues to be for pt to return to his home and resume outpatient services with psychiatrist at [facility] at [facility].  Reason for Continuation of Hospitalization: Delusions   Medication stabilization  Estimated Length of Stay: 3-5 days  Attendees: Patient:  12/09/2017 4:28 PM  Physician: Orson Slick, MD 12/09/2017 4:28 PM  Nursing: Polly Cobia, RN 12/09/2017 4:28 PM  RN Care Manager: 12/09/2017 4:28 PM  Social Worker: Derrek Gu, LCSW 12/09/2017 4:28 PM  Recreational Therapist: Roanna Epley, LRT 12/09/2017 4:28 PM  Other: Darin Engels, [place] 12/09/2017 4:28 PM  Other:  12/09/2017 4:28 PM  Other: 12/09/2017 4:28 PM    Scribe for Treatment Team: Devona Konig, LCSW 12/09/2017 4:28 PM

## 2017-12-09 NOTE — Progress Notes (Signed)
Patient outside in the courtyard with sitter.Pleasant and cooperative with staff & peers.

## 2017-12-09 NOTE — Progress Notes (Signed)
Recreation Therapy Notes  Date: 12/09/2017  Time: 9:30 am   Location: Craft Room   Behavioral response: N/A   Intervention Topic:  Problem Solving  Discussion/Intervention: Patient did not attend group.   Clinical Observations/Feedback:  Patient did not attend group.   Emmabelle Fear LRT/CTRS         Chavela Justiniano 12/09/2017 10:24 AM

## 2017-12-10 NOTE — Progress Notes (Signed)
D- Patient alert and oriented. Patient presents in a pleasant mood on assessment stating that he slept "good" last night and overall he is "feeling good". Patient has no major complaints at this time. Patient denies SI, HI, AVH, and pain at this time. Patient also denies any signs/symptoms of depression/anxiety. Patient's goal for today is to "stay in the moment".   A- Scheduled medications administered to patient, per MD orders. Support and encouragement provided.  Routine safety checks conducted every 15 minutes.  Patient informed to notify staff with problems or concerns.   R- No adverse drug reactions noted. Patient contracts for safety at this time. Patient compliant with medications and treatment plan. Patient receptive, calm, and cooperative. Patient interacts well with others on the unit.  Patient remains safe at this time.

## 2017-12-10 NOTE — [facility] Group Notes (Signed)
LCSW Group Therapy Note  12/10/2017 1:00 pm  Type of Therapy/Topic:  Group Therapy:  Balance in Life  Participation Level:  None  Description of Group:    This group will address the concept of balance and how it feels and looks when one is unbalanced. Patients will be encouraged to process areas in their lives that are out of balance and identify reasons for remaining unbalanced. Facilitators will guide patients in utilizing problem-solving interventions to address and correct the stressor making their life unbalanced. Understanding and applying boundaries will be explored and addressed for obtaining and maintaining a balanced life. Patients will be encouraged to explore ways to assertively make their unbalanced needs known to significant others in their lives, using other group members and facilitator for support and feedback.  Therapeutic Goals: 1. Patient will identify two or more emotions or situations they have that consume much of in their lives. 2. Patient will identify signs/triggers that life has become out of balance:  3. Patient will identify two ways to set boundaries in order to achieve balance in their lives:  4. Patient will demonstrate ability to communicate their needs through discussion and/or role plays  Summary of Patient Progress:  Adam Keller did not chose to engage in any of the group discussions on balance in life.  He sat appropriately throughout most of the group, but left before CSW ended the group officially.    Therapeutic Modalities:   Cognitive Behavioral Therapy Solution-Focused Therapy Assertiveness Training  Adam Keller, [place] 12/10/2017 4:36 PM

## 2017-12-10 NOTE — Plan of Care (Signed)
Patient slept for Estimated Hours of 7.45; Precautionary checks every 15 minutes for safety maintained While Asleep, room free of safety hazards, patient sustains no injury or falls during this shift.  Problem: Coping: Goal: Coping ability will improve Outcome: Progressing Goal: Will verbalize feelings Outcome: Progressing   Problem: Safety: Goal: Ability to remain free from injury will improve Outcome: Progressing   Problem: Self-Concept: Goal: Will verbalize positive feelings about self Outcome: Progressing   Problem: Safety: Goal: Ability to remain free from injury will improve Outcome: Progressing

## 2017-12-10 NOTE — Progress Notes (Signed)
Recreation Therapy Notes   Date: 12/10/2017  Time: 9:30 am   Location: Craft Room   Behavioral response: N/A   Intervention Topic:  Communication  Discussion/Intervention: Patient did not attend group.   Clinical Observations/Feedback:  Patient did not attend group.   Kaidon Kinker LRT/CTRS        Adolfo Granieri 12/10/2017 10:40 AM

## 2017-12-10 NOTE — Progress Notes (Signed)
Patient observed resting in bed at this time, no indication of distress, will resume 1:1 back while awake.

## 2017-12-10 NOTE — [facility] Group Notes (Signed)
LCSW Group Therapy Note 12/10/2017 9:00 AM  Type of Therapy and Topic:  Group Therapy:  Setting Goals  Participation Level:  Minimal  Description of Group: In this process group, patients discussed using strengths to work toward goals and address challenges.  Patients identified two positive things about themselves and one goal they were working on.  Patients were given the opportunity to share openly and support each other's plan for self-empowerment.  The group discussed the value of gratitude and were encouraged to have a daily reflection of positive characteristics or circumstances.  Patients were encouraged to identify a plan to utilize their strengths to work on current challenges and goals.  Therapeutic Goals 1. Patient will verbalize personal strengths/positive qualities and relate how these can assist with achieving desired personal goals 2. Patients will verbalize affirmation of peers plans for personal change and goal setting 3. Patients will explore the value of gratitude and positive focus as related to successful achievement of goals 4. Patients will verbalize a plan for regular reinforcement of personal positive qualities and circumstances.  Summary of Patient Progress: Adam Keller participated some in today's group discussion on setting goals using the SMART Model.  He did not chose to share any personal strengths/positive qualities or plans for personal change.  Adam Keller nodded his head "yes" that he understood how to establish his own goals using the SMART Model.  Adam Keller displayed some delusional thinking as evidenced by being prompted by CSW to use the Model to establish a goal that he would like to work on while in the hospital or when he is discharged back to his home, Adam Keller stated, "I would like to stop telemarketers from calling all the time".  He was a little more rational when he stated  that he would also like to attend all groups while in the hospital.      [place], LCSW 12/10/2017 3:21 PM

## 2017-12-10 NOTE — Progress Notes (Signed)
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

## 2017-12-10 NOTE — Progress Notes (Signed)
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
## 2017-12-10 NOTE — Progress Notes (Signed)
1:1 Patient Hourly Rounding  0700: Patient is on his assigned hall with his assigned MHT.  0800: Patient is in the dayroom with his assigned MHT.   0900: Patient is in his room sleeping with his assigned MHT present.

## 2017-12-10 NOTE — Plan of Care (Signed)
Patient has the ability to cope and has been observed attending and participating in unit groups when appropriate for him. Patient has the ability to verbalize feelings and his thought processes have improved by stating to this writer that he is "feeling good" overall. Patient has the ability to redirect anger and hostility into socially appropriate behaviors. Patient has remained free from injury on the unit thus far and has the ability to make decisions for himself. Patient verbalizes understanding of and has been in compliance with his prescribed therapeutic regimen and all questions/concerns have been addressed and answered at this time. Patient has been observed interacting well with other members on the unit without any issues at this time. Patient denies SI/HI/AVH and has been using fair eye contact while communicating with this Probation officer. Patient has been functioning at an adequate level and remains safe on the unit at this time.  Problem: Coping: Goal: Coping ability will improve Outcome: Progressing Goal: Will verbalize feelings Outcome: Progressing   Problem: Safety: Goal: Ability to redirect hostility and anger into socially appropriate behaviors will improve Outcome: Progressing Goal: Ability to remain free from injury will improve Outcome: Progressing   Problem: Education: Goal: Utilization of techniques to improve thought processes will improve Outcome: Progressing Goal: Knowledge of the prescribed therapeutic regimen will improve Outcome: Progressing   Problem: Health Behavior/Discharge Planning: Goal: Ability to make decisions will improve Outcome: Progressing Goal: Compliance with therapeutic regimen will improve Outcome: Progressing   Problem: Activity: Goal: Will identify at least one activity in which they can participate Outcome: Progressing   Problem: Coping: Goal: Ability to identify and develop effective coping behavior will improve Outcome: Progressing Goal:  Ability to interact with others will improve Outcome: Progressing Goal: Demonstration of participation in decision-making regarding own care will improve Outcome: Progressing Goal: Ability to use eye contact when communicating with others will improve Outcome: Progressing   Problem: Self-Concept: Goal: Will verbalize positive feelings about self Outcome: Progressing   Problem: Spiritual Needs Goal: Ability to function at adequate level Outcome: Progressing

## 2017-12-10 NOTE — Progress Notes (Signed)
Visible in the milieu, slightly disheveled, rashes noted on his chest, "they gave me a cream for that already, I a,m okay...." Calm, pleasant, friendly, logical, no excessive talking, no delusional or hyper-religiosity pronouncement; enjoying the company of 1:1.

## 2017-12-10 NOTE — Progress Notes (Signed)
Pleasant, walking about with 1:1 Safety Sitter, no temper tantrums. Depakote given as ordered.

## 2017-12-11 LAB — COMPREHENSIVE METABOLIC PANEL
ALT: 11 U/L — ABNORMAL LOW (ref 17–63)
ANION GAP: 6 (ref 5–15)
AST: 28 U/L (ref 15–41)
Albumin: 4.4 g/dL (ref 3.5–5.0)
Alkaline Phosphatase: 62 U/L (ref 38–126)
BILIRUBIN TOTAL: 0.9 mg/dL (ref 0.3–1.2)
BUN: 16 mg/dL (ref 6–20)
CO2: 25 mmol/L (ref 22–32)
Calcium: 9.6 mg/dL (ref 8.9–10.3)
Chloride: 108 mmol/L (ref 101–111)
Creatinine, Ser: 0.81 mg/dL (ref 0.61–1.24)
GFR calc Af Amer: >60 mL/min (ref >60)
Glucose, Bld: 105 mg/dL — ABNORMAL HIGH (ref 65–99)
POTASSIUM: 4.2 mmol/L (ref 3.5–5.1)
Sodium: 139 mmol/L (ref 135–145)
TOTAL PROTEIN: 6.3 g/dL — AB (ref 6.5–8.1)

## 2017-12-11 LAB — AMMONIA: AMMONIA: 64 umol/L — AB (ref 9–35)

## 2017-12-11 LAB — LITHIUM LEVEL: LITHIUM LVL: 0.83 mmol/L (ref 0.60–1.20)

## 2017-12-11 LAB — VALPROIC ACID LEVEL: Valproic Acid Lvl: 67 ug/mL (ref 50.0–100.0)

## 2017-12-11 MED ORDER — OLANZAPINE 10 MG PO TABS
30.0000 mg | ORAL_TABLET | Freq: Every day | ORAL | Status: DC
  Administered 2017-12-12 – 2017-12-13 (×2): 30 mg via ORAL
  Filled 2017-12-11 (×2): qty 3

## 2017-12-11 MED ORDER — TEMAZEPAM 15 MG PO CAPS
15.0000 mg | ORAL_CAPSULE | Freq: Every evening | ORAL | Status: DC | PRN
  Administered 2017-12-11: 15 mg via ORAL
  Filled 2017-12-11: qty 1

## 2017-12-11 MED ORDER — LEVOTHYROXINE SODIUM 50 MCG PO TABS
50.0000 ug | ORAL_TABLET | Freq: Every day | ORAL | Status: DC
  Administered 2017-12-11 – 2017-12-14 (×4): 50 ug via ORAL
  Filled 2017-12-11 (×4): qty 1

## 2017-12-11 MED ORDER — DIVALPROEX SODIUM 250 MG PO DR TAB
250.0000 mg | DELAYED_RELEASE_TABLET | Freq: Two times a day (BID) | ORAL | Status: DC
  Administered 2017-12-11 – 2017-12-13 (×5): 250 mg via ORAL
  Filled 2017-12-11 (×6): qty 1

## 2017-12-11 NOTE — Progress Notes (Signed)
   12/11/17 1520  Clinical Encounter Type  Visited With Patient  Visit Type Follow-up   Patient participated in chaplain's spirituality group until meditation component began.

## 2017-12-11 NOTE — Progress Notes (Signed)
Patient ID: Adam Keller, male   DOB: 1956/07/10, 62 y.o.   MRN: [id number] High functioning, logical, coherent, attentive, strong faith; appearance much more improved; no aggressive or temper tantrums; respective, polite, peer to peer interactions appropriate; denied SI/HI/AVH.

## 2017-12-11 NOTE — Progress Notes (Signed)
D- Patient alert and oriented. Patient presents in a pleasant mood on assessment stating that he slept "fine" last night. Patient denies SI, HI, AVH, and pain at this time. Patient also denies any signs/symptoms of depression/anxiety. Patient's goal for today is "to enjoy the day, make new friends, and help people", in which he will "speak with people".  A- Scheduled medications administered to patient, per MD orders. Support and encouragement provided.  Routine safety checks conducted every 15 minutes.  Patient informed to notify staff with problems or concerns.  R- No adverse drug reactions noted. Patient contracts for safety at this time. Patient compliant with medications and treatment plan. Patient receptive, calm, and cooperative. Patient interacts well with others on the unit.  Patient remains safe at this time.

## 2017-12-11 NOTE — Progress Notes (Signed)
[facility] MD Progress Note  12/11/2017 1:24 PM Adam Keller  MRN:  [id number]  Subjective:    Adam Keller is able to hold a conversatrion today and is more relaxed. He is not arguing or discussing his rights. There is some slowness. This could be from elevated ammonia. Li and VPA levels are therapeutic. As usual, he denies any symptoms. Sleep and appetite are good. Tolerates medications well.  Principal Problem: Bipolar affective disorder, manic, severe, with psychotic behavior ([place]) Diagnosis:   Patient Active Problem List   Diagnosis Date Noted  . Bipolar affective disorder, manic, severe, with psychotic behavior ([place]) [F31.2] 12/02/2017    Priority: High  . Bipolar affective disorder, current episode manic with psychotic symptoms ([place]) [F31.2] 12/02/2017  . Syncope [R55] 10/01/2017  . Grief [F43.21] 12/01/2016  . Tongue lesion [K14.8] 12/01/2016  . Loss of weight [R63.4] 05/30/2016  . Well adult exam [Z00.00] 05/08/2014  . Hyperglycemia [R73.9] 10/28/2012  . 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 Left facial pain [R51] 06/09/2011  . 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 PSA, INCREASED [R97.20] 07/10/2010  . ANKLE PAIN [M25.579] 04/08/2010  . Edema [R60.9] 04/08/2010  . Shoulder pain, right [M25.511] 03/06/2010  . Diarrhea [R19.7] 03/28/2009  . HOARSENESS [R49.8] 03/07/2009  . Hypothyroidism [E03.9] 08/10/2007  . Dyslipidemia [E78.5] 08/10/2007  . Bipolar depression ([place]) [F31.9] 08/10/2007  . Allergic rhinitis [J30.9] 08/10/2007  . BPH (benign prostatic hyperplasia) [N40.0] 08/10/2007   Total Time spent with patient: 20 minutes  Past Psychiatric History: bipolar  Past Medical History:  Past Medical History:  Diagnosis Date  . Allergic rhinitis   . BPH (benign prostatic hyperplasia)    elev PSA resolved after abx 2008 Dr Diona Fanti (PSA 5.15 in 8/09)  . Depression    h/o  bipolar Dr Letitia Neri at [facility]  . Dissection of carotid artery ([place]) 06/13/2011  . Elevated glucose   . Headache(784.0)   . Horner's syndrome   . Hyperlipidemia   . Hypothyroidism     Past Surgical History:  Procedure Laterality Date  . PROSTATE BIOPSY    . Vocal Cord Surgery  2011   implants   Family History:  Family History  Problem Relation Age of Onset  . Heart disease Mother        CHF  . Colon cancer Mother   . Diabetes Mother   . Hyperlipidemia Mother   . Heart disease Father        CHF  . Hypertension Other    Family Psychiatric  History: bipolar, suicide Social History:  Social History   Substance and Sexual Activity  Alcohol Use No     Social History   Substance and Sexual Activity  Drug Use No    Social History   Socioeconomic History  . Marital status: Widowed    Spouse name: Not on file  . Number of children: Not on file  . Years of education: Not on file  . Highest education level: Not on file  Occupational History  . Not on file  Social Needs  . Financial resource strain: Not on file  . Food insecurity:    Worry: Not on file    Inability: Not on file  . Transportation needs:    Medical: Not on file    Non-medical: Not on file  Tobacco Use  . Smoking status: Former Smoker    Types: Cigarettes, Cigars  Last attempt to quit: 07/28/2008    Years since quitting: 9.3  . Smokeless tobacco: Never Used  Substance and Sexual Activity  . Alcohol use: No  . Drug use: No  . Sexual activity: Not Currently  Lifestyle  . Physical activity:    Days per week: Not on file    Minutes per session: Not on file  . Stress: Not on file  Relationships  . Social connections:    Talks on phone: Not on file    Gets together: Not on file    Attends religious service: Not on file    Active member of club or organization: Not on file    Attends meetings of clubs or organizations: Not on file    Relationship status: Not on file  Other Topics Concern  . Not on  file  Social History Narrative   Regular Exercise - yes treadmill            Additional Social History:                         Sleep: Fair  Appetite:  Fair  Current Medications: Current Facility-Administered Medications  Medication Dose Route Frequency Provider Last Rate Last Dose  . acetaminophen (TYLENOL) tablet 650 mg  650 mg Oral Q6H PRN Clapacs, John T, MD      . alum & mag hydroxide-simeth (MAALOX/MYLANTA) 200-200-20 MG/5ML suspension 30 mL  30 mL Oral Q4H PRN Clapacs, John T, MD      . colesevelam [facility]) tablet 625 mg  625 mg Oral Q breakfast Clapacs, John T, MD   625 mg at 12/11/17 0900  . divalproex (DEPAKOTE) DR tablet 250 mg  250 mg Oral Q12H Shelda Truby B, MD      . hydrocortisone cream 1 %   Topical BID Faryn Sieg B, MD      . lamoTRIgine (LAMICTAL) tablet 100 mg  100 mg Oral Daily Larnell Granlund B, MD   100 mg at 12/11/17 0900  . levothyroxine (SYNTHROID, LEVOTHROID) tablet 50 mcg  50 mcg Oral QAC breakfast Fairley Copher B, MD   50 mcg at 12/11/17 0527  . lithium carbonate capsule 600 mg  600 mg Oral BID WC Clapacs, John T, MD   600 mg at 12/11/17 0900  . magnesium hydroxide (MILK OF MAGNESIA) suspension 30 mL  30 mL Oral Daily PRN Clapacs, John T, MD      . montelukast (SINGULAIR) tablet 10 mg  10 mg Oral QHS Clapacs, Madie Reno, MD   10 mg at 12/10/17 2134  . multivitamin-lutein (OCUVITE-LUTEIN) capsule 1 capsule  1 capsule Oral Daily Clapacs, Madie Reno, MD   1 capsule at 12/11/17 0900  . [START ON 12/12/2017] OLANZapine (ZYPREXA) tablet 30 mg  30 mg Oral QHS Vencil Basnett B, MD      . temazepam (RESTORIL) capsule 15 mg  15 mg Oral QHS PRN Birttany Dechellis B, MD        Lab Results:  Results for orders placed or performed during the hospital encounter of 12/02/17 (from the past 48 hour(s))  Lithium level     Status: None   Collection Time: 12/11/17  7:11 AM  Result Value Ref Range   Lithium Lvl 0.83 0.60 - 1.20 mmol/L     Comment: Performed at [facility], [address], [place], [place] [address]  Valproic acid level     Status: None   Collection Time: 12/11/17  7:11 AM  Result Value Ref Range   Valproic Acid Lvl 67 50.0 - 100.0 ug/mL    Comment: Performed at [facility], [address] [place], [place] [address]  Comprehensive metabolic panel     Status: Abnormal   Collection Time: 12/11/17  7:11 AM  Result Value Ref Range   Sodium 139 135 - 145 mmol/L   Potassium 4.2 3.5 - 5.1 mmol/L   Chloride 108 101 - 111 mmol/L   CO2 25 22 - 32 mmol/L   Glucose, Bld 105 (H) 65 - 99 mg/dL   BUN 16 6 - 20 mg/dL   Creatinine, Ser 0.81 0.61 - 1.24 mg/dL   Calcium 9.6 8.9 - 10.3 mg/dL   Total Protein 6.3 (L) 6.5 - 8.1 g/dL   Albumin 4.4 3.5 - 5.0 g/dL   AST 28 15 - 41 U/L   ALT 11 (L) 17 - 63 U/L   Alkaline Phosphatase 62 38 - 126 U/L   Total Bilirubin 0.9 0.3 - 1.2 mg/dL   GFR calc non Af Amer >60 >60 mL/min   GFR calc Af Amer >60 >60 mL/min    Comment: (NOTE) The eGFR has been calculated using the CKD EPI equation. This calculation has not been validated in all clinical situations. eGFR's persistently <60 mL/min signify possible Chronic Kidney Disease.    Anion gap 6 5 - 15    Comment: Performed at [facility], [address] [place], [place] [address]  Ammonia     Status: Abnormal   Collection Time: 12/11/17  7:11 AM  Result Value Ref Range   Ammonia 64 (H) 9 - 35 umol/L    Comment: Performed at [facility], [address] [place], [place] [address]    Blood Alcohol level:  Lab Results  Component Value Date   [facility] <10 12/02/2017   ETH <5 38/46/6599    Metabolic Disorder Labs: Lab Results  Component Value Date   HGBA1C 4.7 (L) 12/03/2017   MPG 88.19 12/03/2017   No results found for: PROLACTIN Lab Results  Component Value Date   CHOL 204 (H) 12/03/2017   TRIG 35 12/03/2017   HDL 71 12/03/2017   CHOLHDL 2.9 12/03/2017   VLDL 7 12/03/2017    LDLCALC 126 (H) 12/03/2017   LDLCALC 102 (H) 12/01/2016    Physical Findings: AIMS: Facial and Oral Movements Muscles of Facial Expression: None, normal Lips and Perioral Area: None, normal Jaw: None, normal Tongue: None, normal,Extremity Movements Upper (arms, wrists, hands, fingers): None, normal Lower (legs, knees, ankles, toes): None, normal, Trunk Movements Neck, shoulders, hips: None, normal, Overall Severity Severity of abnormal movements (highest score from questions above): None, normal Incapacitation due to abnormal movements: None, normal Patient's awareness of abnormal movements (rate only patient's report): No Awareness, Dental Status Current problems with teeth and/or dentures?: No Does patient usually wear dentures?: No  CIWA:  CIWA-Ar Total: 2 COWS:  COWS Total Score: 2  Musculoskeletal: Strength & Muscle Tone: within normal limits Gait & Station: normal Patient leans: N/A  Psychiatric Specialty Exam: Physical Exam  Nursing note and vitals reviewed. Psychiatric: His speech is normal and behavior is normal. His affect is blunt. Thought content is delusional. Cognition and memory are normal. He expresses impulsivity.    Review of Systems  Neurological: Negative.   Psychiatric/Behavioral: Negative.   All other systems reviewed and are negative.   Blood pressure 104/77, pulse 100, temperature 97.8 F (36.6 C), temperature source Oral, resp. rate 16, height 6' (1.829 m),  weight 68 kg (150 lb), SpO2 95 %.Body mass index is 20.34 kg/m.  General Appearance: Casual  Eye Contact:  Good  Speech:  Slow and Slurred  Volume:  Decreased  Mood:  Euthymic  Affect:  Flat  Thought Process:  Goal Directed and Descriptions of Associations: Intact  Orientation:  Full (Time, Place, and Person)  Thought Content:  Delusions  Suicidal Thoughts:  No  Homicidal Thoughts:  No  Memory:  Immediate;   Fair Recent;   Fair Remote;   Fair  Judgement:  Poor  Insight:  Lacking   Psychomotor Activity:  Decreased  Concentration:  Concentration: Fair and Attention Span: Fair  Recall:  [employer] of Knowledge:  Fair  Language:  Fair  Akathisia:  No  Handed:  Right  AIMS (if indicated):     Assets:  Communication Skills Desire for Improvement Financial Resources/Insurance Housing Physical Health Resilience  ADL's:  Intact  Cognition:  WNL  Sleep:  Number of Hours: 5     Treatment Plan Summary: Daily contact with patient to assess and evaluate symptoms and progress in treatment and Medication management   Adam Keller is a 61 year old male with a history of bipolar illness admitted floridly psychotic, threatening neighbors with a gun, in the context ofmedication noncomplianace.He is no longer agitated or threatening but still delusional. He accepts medications.   #Suicidal and homicidal ideation, resolved  #Aggressive behavior,resolved -discontinue Haldol 10 mg, Ativan 2 mg, Benadryl 50 mg PRN every 6 hours  #Mood/psychosis, improvement -continue Lithium 600 mg BID, level 0.83 on 5/31 -switch Zyprexa to 30 mg nightly -continue Lamictal 100 mg nightly -lower Depakote to 250 mg BID as his VPA level 67 with ammonia 64   #Insomnia, improved with treatment -lower Restoril to 15 mg nightly and give PRNRestoril 30 mg nightly  #Dyslipidemia -continue Welchol 625 mg daily  #Hypothyroidism -Synthroid50 ucg daily  #Metabolic syndrome monitoring -Lipid panel, TSH and HgbA1C areunremarkble -EKGreviewed, NSR QTc 439  #Admission status -IVC  #Disposition -discharge to home -follow up with primary psychiatrist -patient owns guns that were reportedly removed from his house    Orson Slick, MD 12/11/2017, 1:24 PM

## 2017-12-11 NOTE — Plan of Care (Signed)
Patient has the ability to cope and has been observed attending and participating in unit groups when appropriate for him. Patient has the ability to verbalize feelings and his thought processes have improved by stating to this writer that he is feeling "very good" overall. Patient has the ability to redirect anger and hostility into socially appropriate behaviors. Patient has remained free from injury on the unit thus far and has the ability to make decisions for himself. Patient verbalizes understanding of and has been in compliance with his prescribed therapeutic regimen and all questions/concerns have been addressed and answered at this time. Patient has been observed interacting well with staff and other members on the unit without any issues at this time. Patient denies SI/HI/AVH and has been using fair eye contact while communicating with this Probation officer. Patient has been functioning at an adequate level and remains safe on the unit at this time.  Problem: Coping: Goal: Coping ability will improve Outcome: Progressing Goal: Will verbalize feelings Outcome: Progressing   Problem: Safety: Goal: Ability to redirect hostility and anger into socially appropriate behaviors will improve Outcome: Progressing Goal: Ability to remain free from injury will improve Outcome: Progressing   Problem: Education: Goal: Utilization of techniques to improve thought processes will improve Outcome: Progressing Goal: Knowledge of the prescribed therapeutic regimen will improve Outcome: Progressing   Problem: Health Behavior/Discharge Planning: Goal: Ability to make decisions will improve Outcome: Progressing Goal: Compliance with therapeutic regimen will improve Outcome: Progressing   Problem: Activity: Goal: Will identify at least one activity in which they can participate Outcome: Progressing   Problem: Coping: Goal: Ability to identify and develop effective coping behavior will improve Outcome:  Progressing Goal: Ability to interact with others will improve Outcome: Progressing Goal: Demonstration of participation in decision-making regarding own care will improve Outcome: Progressing Goal: Ability to use eye contact when communicating with others will improve Outcome: Progressing   Problem: Self-Concept: Goal: Will verbalize positive feelings about self Outcome: Progressing   Problem: Spiritual Needs Goal: Ability to function at adequate level Outcome: Progressing

## 2017-12-11 NOTE — Plan of Care (Signed)
Patient slept for Estimated Hours of 5; Precautionary checks every 15 minutes for safety maintained, room free of safety hazards, patient sustains no injury or falls during this shift.  Problem: Coping: Goal: Will verbalize feelings Outcome: Progressing   Problem: Safety: Goal: Ability to redirect hostility and anger into socially appropriate behaviors will improve Outcome: Progressing Goal: Ability to remain free from injury will improve Outcome: Progressing   Problem: Health Behavior/Discharge Planning: Goal: Ability to make decisions will improve Outcome: Progressing Goal: Compliance with therapeutic regimen will improve Outcome: Progressing

## 2017-12-12 MED ORDER — TEMAZEPAM 7.5 MG PO CAPS
7.5000 mg | ORAL_CAPSULE | Freq: Every evening | ORAL | Status: DC | PRN
  Administered 2017-12-12 – 2017-12-13 (×2): 7.5 mg via ORAL
  Filled 2017-12-12 (×2): qty 1

## 2017-12-12 NOTE — [facility] Group Notes (Signed)
LCSW Group Therapy Note  12/12/2017 1:15pm  Type of Therapy and Topic:  Group Therapy:  Cognitive Distortions  Participation Level:  Minimal   Description of Group:    Patients in this group will be introduced to the topic of cognitive distortions.  Patients will identify and describe cognitive distortions, describe the feelings these distortions create for them.  Patients will identify one or more situations in their personal life where they have cognitively distorted thinking and will verbalize challenging this cognitive distortion through positive thinking skills.  Patients will practice the skill of using positive affirmations to challenge cognitive distortions using affirmation cards.    Therapeutic Goals:  1. Patient will identify two or more cognitive distortions they have used 2. Patient will identify one or more emotions that stem from use of a cognitive distortion 3. Patient will demonstrate use of a positive affirmation to counter a cognitive distortion through discussion and/or role play. 4. Patient will describe one way cognitive distortions can be detrimental to wellness   Summary of Patient Progress: The patient scored his mood at an 8 (10 best). Patient shared he did not have any problems with "leaving the past in the past and walking away from what's negative." Pt was unable to list any unhelpful thinking style since he stated it did not apply to him.       Therapeutic Modalities:   Cognitive Behavioral Therapy Motivational Interviewing   Kennidi Yoshida  CUEBAS-COLON, LCSW 12/12/2017 12:40 PM

## 2017-12-12 NOTE — Plan of Care (Signed)
Pt continues to progress towards goals and d/c. RN will continue to monitor.  

## 2017-12-12 NOTE — Progress Notes (Signed)
Nursing note 7p-7a  Pt observed interacting with peers on unit this shift. Displayed a  bright affect and mood upon interaction with this Probation officer. Pt denies pain ,denies SI/HI, and also denies audio or visual hallucinations at this time.  Pt continues to remain safe on the unit and is observed by rounding every 15 min. Pt able to contract for safety. Pt resting in bed with eyes closed with no signs or symptoms of pain or distress noted. RN will continue to monitor.

## 2017-12-12 NOTE — Plan of Care (Signed)
Patient is interacting appropriately with peers, compliant with medications, milieu rules and regulations no distress noted.

## 2017-12-12 NOTE — Plan of Care (Signed)
Data: Patient is appropriate and cooperative to assessment. Patient denies SI/HI/AVH. Patient has completed daily self inventory worksheet. Patient reports a positive mood, has an appropriate affect. Patient is pleasant during interaction. Patient states that he is preparing for discharge. Patient has no complaints and a pain rating of 0/10. Patient reports good sleep quality, appetite is good. Patient rates depression "0/10" , feelings of hopelessness "0/10" and anxiety "0/10" Patients goal for today is "prepare for discharge."  Action:  Q x 15 minute observation checks were completed for safety. Patient was provided with education on medications. Patient was offered support and encouragement. Patient was given scheduled medications. Patient  was encourage to attend groups, participate in unit activities and continue with plan of care.    Response: Patient is medication compliant. Patient has no complaints at this time. Patient is receptive to treatment and safety maintained on unit.    Problem: Coping: Goal: Will verbalize feelings Outcome: Progressing   Problem: Safety: Goal: Ability to remain free from injury will improve Outcome: Progressing   Problem: Health Behavior/Discharge Planning: Goal: Ability to make decisions will improve Outcome: Progressing Goal: Compliance with therapeutic regimen will improve Outcome: Progressing   Problem: Coping: Goal: Ability to use eye contact when communicating with others will improve Outcome: Progressing   Problem: Self-Concept: Goal: Will verbalize positive feelings about self Outcome: Progressing   Problem: Spiritual Needs Goal: Ability to function at adequate level Outcome: Progressing

## 2017-12-12 NOTE — Progress Notes (Signed)
Patient is alert and oriented x 4 no distress noted. Patient's thought are organized no distress noted. Patient denies SI/HI/AVH, his thoughts are organized, speech is less pressured, not tangential. 15 minutes safety checks maintained will continue to monitor.

## 2017-12-12 NOTE — Progress Notes (Signed)
[facility] MD Progress Note  12/12/2017 1:08 PM DEBORAH DONDERO  MRN:  [id number]  Subjective:    Adam Keller is no longer overtly psychotic. He appears depressed but denies any symptoms whatsoever. He spends tine in common area watching TV but not interacting with peers or staff. There is no psychotic nonten but he is evasive when discussing guns. He reportedly owns guns, threatened neighbors prior to admission. I do not have a good sense if the guns were removed.  Principal Problem: Bipolar affective disorder, manic, severe, with psychotic behavior ([place]) Diagnosis:   Patient Active Problem List   Diagnosis Date Noted  . Bipolar affective disorder, manic, severe, with psychotic behavior ([place]) [F31.2] 12/02/2017    Priority: High  . Bipolar affective disorder, current episode manic with psychotic symptoms ([place]) [F31.2] 12/02/2017  . Syncope [R55] 10/01/2017  . Grief [F43.21] 12/01/2016  . Tongue lesion [K14.8] 12/01/2016  . Loss of weight [R63.4] 05/30/2016  . Well adult exam [Z00.00] 05/08/2014  . Hyperglycemia [R73.9] 10/28/2012  . 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 Left facial pain [R51] 06/09/2011  . 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 PSA, INCREASED [R97.20] 07/10/2010  . ANKLE PAIN [M25.579] 04/08/2010  . Edema [R60.9] 04/08/2010  . Shoulder pain, right [M25.511] 03/06/2010  . Diarrhea [R19.7] 03/28/2009  . HOARSENESS [R49.8] 03/07/2009  . Hypothyroidism [E03.9] 08/10/2007  . Dyslipidemia [E78.5] 08/10/2007  . Bipolar depression ([place]) [F31.9] 08/10/2007  . Allergic rhinitis [J30.9] 08/10/2007  . BPH (benign prostatic hyperplasia) [N40.0] 08/10/2007   Total Time spent with patient: 20 minutes  Past Psychiatric History: bipolar disorder  Past Medical History:  Past Medical History:  Diagnosis Date  . Allergic rhinitis   . BPH (benign prostatic hyperplasia)    elev PSA  resolved after abx 2008 Dr Diona Fanti (PSA 5.15 in 8/09)  . Depression    h/o bipolar Dr Letitia Neri at [facility]  . Dissection of carotid artery ([place]) 06/13/2011  . Elevated glucose   . Headache(784.0)   . Horner's syndrome   . Hyperlipidemia   . Hypothyroidism     Past Surgical History:  Procedure Laterality Date  . PROSTATE BIOPSY    . Vocal Cord Surgery  2011   implants   Family History:  Family History  Problem Relation Age of Onset  . Heart disease Mother        CHF  . Colon cancer Mother   . Diabetes Mother   . Hyperlipidemia Mother   . Heart disease Father        CHF  . Hypertension Other    Family Psychiatric  History: bipolar, suicide Social History:  Social History   Substance and Sexual Activity  Alcohol Use No     Social History   Substance and Sexual Activity  Drug Use No    Social History   Socioeconomic History  . Marital status: Widowed    Spouse name: Not on file  . Number of children: Not on file  . Years of education: Not on file  . Highest education level: Not on file  Occupational History  . Not on file  Social Needs  . Financial resource strain: Not on file  . Food insecurity:    Worry: Not on file    Inability: Not on file  . Transportation needs:    Medical: Not on file    Non-medical: Not on file  Tobacco Use  .  Smoking status: Former Smoker    Types: Cigarettes, Cigars    Last attempt to quit: 07/28/2008    Years since quitting: 9.3  . Smokeless tobacco: Never Used  Substance and Sexual Activity  . Alcohol use: No  . Drug use: No  . Sexual activity: Not Currently  Lifestyle  . Physical activity:    Days per week: Not on file    Minutes per session: Not on file  . Stress: Not on file  Relationships  . Social connections:    Talks on phone: Not on file    Gets together: Not on file    Attends religious service: Not on file    Active member of club or organization: Not on file    Attends meetings of clubs or organizations: Not  on file    Relationship status: Not on file  Other Topics Concern  . Not on file  Social History Narrative   Regular Exercise - yes treadmill            Additional Social History:                         Sleep: Fair  Appetite:  Fair  Current Medications: Current Facility-Administered Medications  Medication Dose Route Frequency Provider Last Rate Last Dose  . acetaminophen (TYLENOL) tablet 650 mg  650 mg Oral Q6H PRN Clapacs, John T, MD      . alum & mag hydroxide-simeth (MAALOX/MYLANTA) 200-200-20 MG/5ML suspension 30 mL  30 mL Oral Q4H PRN Clapacs, John T, MD      . colesevelam [facility]) tablet 625 mg  625 mg Oral Q breakfast Clapacs, Madie Reno, MD   625 mg at 12/12/17 0834  . divalproex (DEPAKOTE) DR tablet 250 mg  250 mg Oral Q12H ,  B, MD   250 mg at 12/12/17 0834  . hydrocortisone cream 1 %   Topical BID ,  B, MD      . lamoTRIgine (LAMICTAL) tablet 100 mg  100 mg Oral Daily ,  B, MD   100 mg at 12/12/17 0834  . levothyroxine (SYNTHROID, LEVOTHROID) tablet 50 mcg  50 mcg Oral QAC breakfast ,  B, MD   50 mcg at 12/12/17 0645  . lithium carbonate capsule 600 mg  600 mg Oral BID WC Clapacs, Madie Reno, MD   600 mg at 12/12/17 0834  . magnesium hydroxide (MILK OF MAGNESIA) suspension 30 mL  30 mL Oral Daily PRN Clapacs, John T, MD      . montelukast (SINGULAIR) tablet 10 mg  10 mg Oral QHS Clapacs, Madie Reno, MD   10 mg at 12/11/17 2128  . multivitamin-lutein (OCUVITE-LUTEIN) capsule 1 capsule  1 capsule Oral Daily Clapacs, Madie Reno, MD   1 capsule at 12/12/17 0834  . OLANZapine (ZYPREXA) tablet 30 mg  30 mg Oral QHS ,  B, MD      . temazepam (RESTORIL) capsule 15 mg  15 mg Oral QHS PRN ,  B, MD   15 mg at 12/11/17 2128    Lab Results:  Results for orders placed or performed during the hospital encounter of 12/02/17 (from the past 48 hour(s))  Lithium level     Status: None    Collection Time: 12/11/17  7:11 AM  Result Value Ref Range   Lithium Lvl 0.83 0.60 - 1.20 mmol/L    Comment: Performed at [facility], [address] [place], [place] [address]  Valproic acid  level     Status: None   Collection Time: 12/11/17  7:11 AM  Result Value Ref Range   Valproic Acid Lvl 67 50.0 - 100.0 ug/mL    Comment: Performed at [facility], [address] [place], [place] [address]  Comprehensive metabolic panel     Status: Abnormal   Collection Time: 12/11/17  7:11 AM  Result Value Ref Range   Sodium 139 135 - 145 mmol/L   Potassium 4.2 3.5 - 5.1 mmol/L   Chloride 108 101 - 111 mmol/L   CO2 25 22 - 32 mmol/L   Glucose, Bld 105 (H) 65 - 99 mg/dL   BUN 16 6 - 20 mg/dL   Creatinine, Ser 0.81 0.61 - 1.24 mg/dL   Calcium 9.6 8.9 - 10.3 mg/dL   Total Protein 6.3 (L) 6.5 - 8.1 g/dL   Albumin 4.4 3.5 - 5.0 g/dL   AST 28 15 - 41 U/L   ALT 11 (L) 17 - 63 U/L   Alkaline Phosphatase 62 38 - 126 U/L   Total Bilirubin 0.9 0.3 - 1.2 mg/dL   GFR calc non Af Amer >60 >60 mL/min   GFR calc Af Amer >60 >60 mL/min    Comment: (NOTE) The eGFR has been calculated using the CKD EPI equation. This calculation has not been validated in all clinical situations. eGFR's persistently <60 mL/min signify possible Chronic Kidney Disease.    Anion gap 6 5 - 15    Comment: Performed at [facility], [address] [place], [place] [address]  Ammonia     Status: Abnormal   Collection Time: 12/11/17  7:11 AM  Result Value Ref Range   Ammonia 64 (H) 9 - 35 umol/L    Comment: Performed at [facility], [address] [place], [place] [address]    Blood Alcohol level:  Lab Results  Component Value Date   [facility] <10 12/02/2017   ETH <5 39/76/7341    Metabolic Disorder Labs: Lab Results  Component Value Date   HGBA1C 4.7 (L) 12/03/2017   MPG 88.19 12/03/2017   No results found for: PROLACTIN Lab Results  Component Value Date   CHOL 204 (H)  12/03/2017   TRIG 35 12/03/2017   HDL 71 12/03/2017   CHOLHDL 2.9 12/03/2017   VLDL 7 12/03/2017   LDLCALC 126 (H) 12/03/2017   LDLCALC 102 (H) 12/01/2016    Physical Findings: AIMS: Facial and Oral Movements Muscles of Facial Expression: None, normal Lips and Perioral Area: None, normal Jaw: None, normal Tongue: None, normal,Extremity Movements Upper (arms, wrists, hands, fingers): None, normal Lower (legs, knees, ankles, toes): None, normal, Trunk Movements Neck, shoulders, hips: None, normal, Overall Severity Severity of abnormal movements (highest score from questions above): None, normal Incapacitation due to abnormal movements: None, normal Patient's awareness of abnormal movements (rate only patient's report): No Awareness, Dental Status Current problems with teeth and/or dentures?: No Does patient usually wear dentures?: No  CIWA:  CIWA-Ar Total: 2 COWS:  COWS Total Score: 2  Musculoskeletal: Strength & Muscle Tone: within normal limits Gait & Station: normal Patient leans: N/A  Psychiatric Specialty Exam: Physical Exam  Nursing note and vitals reviewed. Psychiatric: His speech is normal. Thought content normal. His affect is blunt. He is withdrawn. Cognition and memory are normal. He expresses impulsivity.    Review of Systems  Neurological: Negative.   Psychiatric/Behavioral: Negative.   All other systems reviewed and are negative.   Blood pressure 118/80, pulse 96, temperature  97.7 F (36.5 C), temperature source Oral, resp. rate 18, height 6' (1.829 m), weight 68 kg (150 lb), SpO2 97 %.Body mass index is 20.34 kg/m.  General Appearance: Casual  Eye Contact:  Good  Speech:  Clear and Coherent  Volume:  Decreased  Mood:  Depressed  Affect:  Flat  Thought Process:  Goal Directed and Descriptions of Associations: Intact  Orientation:  Full (Time, Place, and Person)  Thought Content:  WDL  Suicidal Thoughts:  No  Homicidal Thoughts:  No  Memory:   Immediate;   Fair Recent;   Fair Remote;   Fair  Judgement:  Poor  Insight:  Lacking  Psychomotor Activity:  Psychomotor Retardation  Concentration:  Concentration: Fair and Attention Span: Fair  Recall:  [employer] of Knowledge:  Fair  Language:  Fair  Akathisia:  No  Handed:  Right  AIMS (if indicated):     Assets:  Communication Skills Desire for Improvement Financial Resources/Insurance Housing Physical Health Resilience Transportation  ADL's:  Intact  Cognition:  WNL  Sleep:  Number of Hours: 6.3     Treatment Plan Summary: Daily contact with patient to assess and evaluate symptoms and progress in treatment and Medication management   Mr. Gjerde is a 62 year old male with a history of bipolar illness admitted floridly psychotic, threatening neighbors with a gun, in the context ofmedication noncomplianace.He is no longer agitated or threatening but still delusional. He accepts medications.   #Suicidal and homicidal ideation, resolved  #Aggressive behavior,resolved  #Mood/psychosis, improvement -continue Lithium 600 mg BID, level 0.83 on 5/31 -continue Zyprexa to 30 mg nightly -continue Lamictal 100 mg nightly -lower Depakote to 250 mg BID as his VPA level 67 with ammonia 64, VPA level on Mon  #Insomnia, improved with treatment -slept with Restoril 15 mg, let's lower to 7.5 mg PRN  #Dyslipidemia -continue Welchol 625 mg daily  #Hypothyroidism -Synthroid50 ucg daily  #Metabolic syndrome monitoring -Lipid panel, TSH and HgbA1C areunremarkble -EKGreviewed, NSR QTc 439  #Admission status -IVC  #Disposition -discharge to home -follow up with primary psychiatrist -patient owns guns that were reportedly removed from his house    Orson Slick, MD 12/12/2017, 1:08 PM

## 2017-12-13 MED ORDER — OLANZAPINE 15 MG PO TABS: 30.0000 mg | ORAL_TABLET | Freq: Every day | ORAL | 1 refills | Status: DC

## 2017-12-13 MED ORDER — MONTELUKAST SODIUM 10 MG PO TABS: 10.0000 mg | ORAL_TABLET | Freq: Every day | ORAL | 3 refills | Status: DC

## 2017-12-13 MED ORDER — DUTASTERIDE 0.5 MG PO CAPS: 0.5000 mg | ORAL_CAPSULE | ORAL | 1 refills | Status: DC

## 2017-12-13 MED ORDER — OMEPRAZOLE 40 MG PO CPDR: 40.0000 mg | DELAYED_RELEASE_CAPSULE | Freq: Every day | ORAL | 2 refills | Status: AC | PRN

## 2017-12-13 MED ORDER — LITHIUM CARBONATE 600 MG PO CAPS: 600.0000 mg | ORAL_CAPSULE | Freq: Two times a day (BID) | ORAL | 1 refills | Status: AC

## 2017-12-13 MED ORDER — ZALEPLON 5 MG PO CAPS: 5.0000 mg | ORAL_CAPSULE | Freq: Every day | ORAL | 1 refills | Status: DC

## 2017-12-13 MED ORDER — HYDROCORTISONE 1 % EX CREA: TOPICAL_CREAM | Freq: Two times a day (BID) | CUTANEOUS | 0 refills | Status: DC

## 2017-12-13 MED ORDER — DIVALPROEX SODIUM 250 MG PO DR TAB: 250.0000 mg | DELAYED_RELEASE_TABLET | Freq: Two times a day (BID) | ORAL | 1 refills | Status: DC

## 2017-12-13 MED ORDER — LAMOTRIGINE 100 MG PO TABS: 100.0000 mg | ORAL_TABLET | Freq: Every day | ORAL | 1 refills | Status: DC

## 2017-12-13 MED ORDER — LEVOTHYROXINE SODIUM 50 MCG PO TABS: 75.0000 ug | ORAL_TABLET | Freq: Every day | ORAL | 3 refills | Status: DC

## 2017-12-13 NOTE — Progress Notes (Signed)
[facility] MD Progress Note  12/13/2017 11:47 AM Adam Keller  MRN:  [id number]  Subjective:    [name] appears stable. Denies any symptoms of depression, anxiety or psychosis,.accepts medications and tolerates therm well. We will rechecj VPA and ammonia level tomorrow before discharge.   Principal Problem: Bipolar affective disorder, manic, severe, with psychotic behavior ([place]) Diagnosis:   Patient Active Problem List   Diagnosis Date Noted  . Bipolar affective disorder, manic, severe, with psychotic behavior ([place]) [F31.2] 12/02/2017    Priority: High  . Bipolar affective disorder, current episode manic with psychotic symptoms ([place]) [F31.2] 12/02/2017  . Syncope [R55] 10/01/2017  . Grief [F43.21] 12/01/2016  . Tongue lesion [K14.8] 12/01/2016  . Loss of weight [R63.4] 05/30/2016  . Well adult exam [Z00.00] 05/08/2014  . Hyperglycemia [R73.9] 10/28/2012  . 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 Left facial pain [R51] 06/09/2011  . 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 PSA, INCREASED [R97.20] 07/10/2010  . ANKLE PAIN [M25.579] 04/08/2010  . Edema [R60.9] 04/08/2010  . Shoulder pain, right [M25.511] 03/06/2010  . Diarrhea [R19.7] 03/28/2009  . HOARSENESS [R49.8] 03/07/2009  . Hypothyroidism [E03.9] 08/10/2007  . Dyslipidemia [E78.5] 08/10/2007  . Bipolar depression ([place]) [F31.9] 08/10/2007  . Allergic rhinitis [J30.9] 08/10/2007  . BPH (benign prostatic hyperplasia) [N40.0] 08/10/2007   Total Time spent with patient: 20 minutes  Past Psychiatric History: bipolar  Past Medical History:  Past Medical History:  Diagnosis Date  . Allergic rhinitis   . BPH (benign prostatic hyperplasia)    elev PSA resolved after abx 2008 Dr Adam Keller (PSA 5.15 in 8/09)  . Depression    h/o bipolar Dr Adam Keller at [facility]  . Dissection of carotid artery ([place]) 06/13/2011  . Elevated glucose   .  Headache(784.0)   . Horner's syndrome   . Hyperlipidemia   . Hypothyroidism     Past Surgical History:  Procedure Laterality Date  . PROSTATE BIOPSY    . Vocal Cord Surgery  2011   implants   Family History:  Family History  Problem Relation Age of Onset  . Heart disease Mother        CHF  . Colon cancer Mother   . Diabetes Mother   . Hyperlipidemia Mother   . Heart disease Father        CHF  . Hypertension Other    Family Psychiatric  History: bipolar, completed suicide Social History:  Social History   Substance and Sexual Activity  Alcohol Use No     Social History   Substance and Sexual Activity  Drug Use No    Social History   Socioeconomic History  . Marital status: Widowed    Spouse name: Not on file  . Number of children: Not on file  . Years of education: Not on file  . Highest education level: Not on file  Occupational History  . Not on file  Social Needs  . Financial resource strain: Not on file  . Food insecurity:    Worry: Not on file    Inability: Not on file  . Transportation needs:    Medical: Not on file    Non-medical: Not on file  Tobacco Use  . Smoking status: Former Smoker    Types: Cigarettes, Cigars    Last attempt to quit: 07/28/2008    Years since quitting: 9.3  . Smokeless tobacco: Never Used  Substance and Sexual  Activity  . Alcohol use: No  . Drug use: No  . Sexual activity: Not Currently  Lifestyle  . Physical activity:    Days per week: Not on file    Minutes per session: Not on file  . Stress: Not on file  Relationships  . Social connections:    Talks on phone: Not on file    Gets together: Not on file    Attends religious service: Not on file    Active member of club or organization: Not on file    Attends meetings of clubs or organizations: Not on file    Relationship status: Not on file  Other Topics Concern  . Not on file  Social History Narrative   Regular Exercise - yes treadmill             Additional Social History:                         Sleep: Fair  Appetite:  Fair  Current Medications: Current Facility-Administered Medications  Medication Dose Route Frequency Provider Last Rate Last Dose  . acetaminophen (TYLENOL) tablet 650 mg  650 mg Oral Q6H PRN Adam Keller, Adam T, MD      . alum & mag hydroxide-simeth (MAALOX/MYLANTA) 200-200-20 MG/5ML suspension 30 mL  30 mL Oral Q4H PRN Adam Keller, Adam T, MD      . colesevelam [facility]) tablet 625 mg  625 mg Oral Q breakfast Adam Keller, Adam Reno, MD   625 mg at 12/13/17 0915  . divalproex (DEPAKOTE) DR tablet 250 mg  250 mg Oral Q12H Adam Keller B, MD   250 mg at 12/13/17 0915  . hydrocortisone cream 1 %   Topical BID Adam Keller B, MD      . lamoTRIgine (LAMICTAL) tablet 100 mg  100 mg Oral Daily Adam Keller B, MD   100 mg at 12/13/17 0915  . levothyroxine (SYNTHROID, LEVOTHROID) tablet 50 mcg  50 mcg Oral QAC breakfast Adam Keller B, MD   50 mcg at 12/13/17 0614  . lithium carbonate capsule 600 mg  600 mg Oral BID WC Adam Keller, Adam Reno, MD   600 mg at 12/13/17 0915  . magnesium hydroxide (MILK OF MAGNESIA) suspension 30 mL  30 mL Oral Daily PRN Adam Keller, Adam T, MD      . montelukast (SINGULAIR) tablet 10 mg  10 mg Oral QHS Adam Keller, Adam Reno, MD   10 mg at 12/12/17 2139  . multivitamin-lutein (OCUVITE-LUTEIN) capsule 1 capsule  1 capsule Oral Daily Adam Keller, Adam Reno, MD   1 capsule at 12/13/17 0915  . OLANZapine (ZYPREXA) tablet 30 mg  30 mg Oral QHS Adam Keller B, MD   30 mg at 12/12/17 2139  . temazepam (RESTORIL) capsule 7.5 mg  7.5 mg Oral QHS PRN Adam Keller B, MD   7.5 mg at 12/12/17 2139    Lab Results: No results found for this or any previous visit (from the past 48 hour(s)).  Blood Alcohol level:  Lab Results  Component Value Date   ETH <10 12/02/2017   ETH <5 00/86/7619    Metabolic Disorder Labs: Lab Results  Component Value Date   HGBA1C 4.7 (L) 12/03/2017   MPG  88.19 12/03/2017   No results found for: PROLACTIN Lab Results  Component Value Date   CHOL 204 (H) 12/03/2017   TRIG 35 12/03/2017   HDL 71 12/03/2017   CHOLHDL 2.9 12/03/2017   VLDL 7 12/03/2017   LDLCALC  126 (H) 12/03/2017   LDLCALC 102 (H) 12/01/2016    Physical Findings: AIMS: Facial and Oral Movements Muscles of Facial Expression: None, normal Lips and Perioral Area: None, normal Jaw: None, normal Tongue: None, normal,Extremity Movements Upper (arms, wrists, hands, fingers): None, normal Lower (legs, knees, ankles, toes): None, normal, Trunk Movements Neck, shoulders, hips: None, normal, Overall Severity Severity of abnormal movements (highest score from questions above): None, normal Incapacitation due to abnormal movements: None, normal Patient's awareness of abnormal movements (rate only patient's report): No Awareness, Dental Status Current problems with teeth and/or dentures?: No Does patient usually wear dentures?: No  CIWA:  CIWA-Ar Total: 2 COWS:  COWS Total Score: 2  Musculoskeletal: Strength & Muscle Tone: within normal limits Gait & Station: normal Patient leans: N/A  Psychiatric Specialty Exam: Physical Exam  Nursing note and vitals reviewed. Psychiatric: He has a normal mood and affect. His speech is normal and behavior is normal. Thought content normal. Cognition and memory are normal. He expresses impulsivity.    Review of Systems  Genitourinary: Negative.   Musculoskeletal: Negative.   All other systems reviewed and are negative.   Blood pressure 111/72, pulse (!) 101, temperature 98.1 F (36.7 C), temperature source Oral, resp. rate (!) 6, height 6' (1.829 m), weight 68 kg (150 lb), SpO2 97 %.Body mass index is 20.34 kg/m.  General Appearance: Casual  Eye Contact:  Good  Speech:  Clear and Coherent  Volume:  Normal  Mood:  Euthymic  Affect:  Appropriate  Thought Process:  Goal Directed and Descriptions of Associations: Intact   Orientation:  Full (Time, Place, and Person)  Thought Content:  WDL  Suicidal Thoughts:  No  Homicidal Thoughts:  No  Memory:  Immediate;   Fair Recent;   Fair Remote;   Fair  Judgement:  Impaired  Insight:  Lacking  Psychomotor Activity:  Normal  Concentration:  Concentration: Fair and Attention Span: Fair  Recall:  [employer] of Knowledge:  Fair  Language:  Fair  Akathisia:  No  Handed:  Right  AIMS (if indicated):     Assets:  Communication Skills Desire for Improvement Financial Resources/Insurance Housing Physical Health Resilience  ADL's:  Intact  Cognition:  WNL  Sleep:  Number of Hours: 8     Treatment Plan Summary: Daily contact with patient to assess and evaluate symptoms and progress in treatment and Medication management   Mr. Hessling is a 62 year old male with a history of bipolar illness admitted floridly psychotic, threatening neighbors with a gun, in the context ofmedication noncomplianace.Psychotic and mood symptoms have resolved. Tolerates medications well. Discharg tomorrow.   #Suicidal and homicidal ideation, resolved  #Aggressive behavior,resolved  #Mood/psychosis, improvement -continue Lithium 600 mg BID, level0.83 on 5/31 -continue Zyprexa to 30 mg nightly -continue Lamictal 100 mg nightly -continue Depakote to 250 mg BID, level in am  #Insomnia, improved with treatment -slept with Restoril 15 mg, let's lower to 7.5 mg PRN  #Dyslipidemia -continue Welchol 625 mg daily  #Hypothyroidism -Synthroid50 ucg daily  #Metabolic syndrome monitoring -Lipid panel, TSH and HgbA1C areunremarkble -EKGreviewed, NSR QTc 439  #Admission status -IVC  #Disposition -discharge to home -follow up with primary psychiatrist -patient owns guns that were reportedly removed from his house    Orson Slick, MD 12/13/2017, 11:47 AM
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
## 2017-12-13 NOTE — BHH Suicide Risk Assessment (Signed)
[facility] Discharge Suicide Risk Assessment   Principal Problem: Bipolar affective disorder, manic, severe, with psychotic behavior [facility]) Discharge Diagnoses:  Patient Active Problem List   Diagnosis Date Noted  . Bipolar affective disorder, manic, severe, with psychotic behavior ([place]) [F31.2] 12/02/2017    Priority: High  . Bipolar affective disorder, current episode manic with psychotic symptoms ([place]) [F31.2] 12/02/2017  . Syncope [R55] 10/01/2017  . Grief [F43.21] 12/01/2016  . Tongue lesion [K14.8] 12/01/2016  . Loss of weight [R63.4] 05/30/2016  . Well adult exam [Z00.00] 05/08/2014  . Hyperglycemia [R73.9] 10/28/2012  . 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 Left facial pain [R51] 06/09/2011  . 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 PSA, INCREASED [R97.20] 07/10/2010  . ANKLE PAIN [M25.579] 04/08/2010  . Edema [R60.9] 04/08/2010  . Shoulder pain, right [M25.511] 03/06/2010  . Diarrhea [R19.7] 03/28/2009  . HOARSENESS [R49.8] 03/07/2009  . Hypothyroidism [E03.9] 08/10/2007  . Dyslipidemia [E78.5] 08/10/2007  . Bipolar depression ([place]) [F31.9] 08/10/2007  . Allergic rhinitis [J30.9] 08/10/2007  . BPH (benign prostatic hyperplasia) [N40.0] 08/10/2007    Total Time spent with patient: 20 minutes  Musculoskeletal: Strength & Muscle Tone: within normal limits Gait & Station: normal Patient leans: N/A  Psychiatric Specialty Exam: Review of Systems  Neurological: Negative.   Psychiatric/Behavioral: Negative.   All other systems reviewed and are negative.   Blood pressure 109/80, pulse (!) 107, temperature 98.4 F (36.9 C), temperature source Oral, resp. rate 16, height 6' (1.829 m), weight 68 kg (150 lb), SpO2 98 %.Body mass index is 20.34 kg/m.  General Appearance: Casual  Eye Contact::  Good  Speech:  Clear and Coherent409  Volume:  Normal  Mood:  Euthymic  Affect:   Appropriate  Thought Process:  Goal Directed and Descriptions of Associations: Intact  Orientation:  Full (Time, Place, and Person)  Thought Content:  WDL  Suicidal Thoughts:  No  Homicidal Thoughts:  No  Memory:  Immediate;   Fair Recent;   Fair Remote;   Fair  Judgement:  Impaired  Insight:  Shallow  Psychomotor Activity:  Normal  Concentration:  Fair  Recall:  [employer] of Knowledge:Fair  Language: Fair  Akathisia:  No  Handed:  Right  AIMS (if indicated):     Assets:  Communication Skills Desire for Improvement Financial Resources/Insurance Housing Physical Health Resilience Transportation  Sleep:  Number of Hours: 7.5  Cognition: WNL  ADL's:  Intact   Mental Status Per Nursing Assessment::   On Admission:  Belief that plan would result in death  Demographic Factors:  Male, Divorced or widowed, Caucasian and Living alone  Loss Factors: NA  Historical Factors: Family history of suicide, Family history of mental illness or substance abuse and Impulsivity  Risk Reduction Factors:   Sense of responsibility to family and Positive therapeutic relationship  Continued Clinical Symptoms:  Bipolar Disorder:   Mixed State  Cognitive Features That Contribute To Risk:  None    Suicide Risk:  Minimal: No identifiable suicidal ideation.  Patients presenting with no risk factors but with morbid ruminations; may be classified as minimal risk based on the severity of the depressive symptoms  Follow-up Information    [facility] Follow up.   Why:  TBD Contact information: [address] [place], [place]  [address] [phone] Phone  Care Team: Dr. Susy Frizzle, MD [address] [place], [place]  [address] [phone] Phone          Plan Of Care/Follow-up recommendations:  Activity:  as tolerated Diet:  low sodium heart healthy Other:  keep follow up appoiuntments  Orson Slick, MD 12/14/2017, 8:26 AM

## 2017-12-13 NOTE — Plan of Care (Signed)
Patient is pleasant and cooperative.  Alert and oriented.  Smiles with engagement.  Visible in the milieu.  Interacting with peers and staff appropriately.  No inappropriate behavior noted, no outbursts.  Denies SI/HI/AVH.  Support and encouragement offered.  Safety maintained.

## 2017-12-13 NOTE — BHH Group Notes (Signed)
[facility] Group Notes:  (Nursing/MHT/Case Management/Adjunct)  Date:  12/13/2017  Time:  9:13 PM  Type of Therapy:  Group Therapy  Participation Level:  Active  Participation Quality:  Attentive  Affect:  Appropriate  Cognitive:  Appropriate  Insight:  Appropriate  Engagement in Group:  Engaged  Modes of Intervention:  Discussion  Summary of Progress/Problems:  Barnie Mort 12/13/2017, 9:13 PM

## 2017-12-13 NOTE — BHH Group Notes (Signed)
LCSW Group Therapy Note 12/13/2017 1:15pm  Type of Therapy and Topic: Group Therapy: Feelings Around Returning Home & Establishing a Supportive Framework and Supporting Oneself When Supports Not Available  Participation Level: Active  Description of Group:  Patients first processed thoughts and feelings about upcoming discharge. These included fears of upcoming changes, lack of change, new living environments, judgements and expectations from others and overall stigma of mental health issues. The group then discussed the definition of a supportive framework, what that looks and feels like, and how do to discern it from an unhealthy non-supportive network. The group identified different types of supports as well as what to do when your family/friends are less than helpful or unavailable  Therapeutic Goals  1. Patient will identify one healthy supportive network that they can use at discharge. 2. Patient will identify one factor of a supportive framework and how to tell it from an unhealthy network. 3. Patient able to identify one coping skill to use when they do not have positive supports from others. 4. Patient will demonstrate ability to communicate their needs through discussion and/or role plays.  Summary of Patient Progress:  Pt scored his mood at an 8 (10 best.) Pt engaged during group session. As patients processed their anxiety about discharge and described healthy supports patient shared that he is ready to go back home. He stated " I knew I was coming here coming here before I came, I took a break now is time to go back."  Patients identified at least one self-care tool they were willing to use after discharge; go to church.   Therapeutic Modalities Cognitive Behavioral Therapy Motivational Interviewing   Anwar Sakata  CUEBAS-COLON, LCSW 12/13/2017 12:59 PM

## 2017-12-14 LAB — LITHIUM LEVEL: Lithium Lvl: 0.73 mmol/L (ref 0.60–1.20)

## 2017-12-14 LAB — AMMONIA: AMMONIA: 49 umol/L — AB (ref 9–35)

## 2017-12-14 LAB — VALPROIC ACID LEVEL: Valproic Acid Lvl: 41 ug/mL — ABNORMAL LOW (ref 50.0–100.0)

## 2017-12-14 MED ORDER — LAMOTRIGINE 100 MG PO TABS: 200.0000 mg | ORAL_TABLET | Freq: Every day | ORAL | Status: DC

## 2017-12-14 MED ORDER — LAMOTRIGINE 200 MG PO TABS: 200.0000 mg | ORAL_TABLET | Freq: Every day | ORAL | 1 refills | Status: AC

## 2017-12-14 NOTE — Progress Notes (Signed)
Patient alert, oriented and ambulatory. Patient discharge home via cab. AVS/Follow-up/Prescriptions reviewed with patient and he verbalized understanding. Written copies given to patient. All patient belongings returned to him. Patient escorted to lobby by staff to get in cab to go home.

## 2017-12-14 NOTE — Progress Notes (Signed)
  [facility] Adult Case Management Discharge Plan :  Will you be returning to the same living situation after discharge:  Yes,    At discharge, do you have transportation home?: No.Will discharge by cab Do you have the ability to pay for your medications: Yes,     Release of information consent forms completed and in the chart;  Patient's signature needed at discharge.  Patient to Follow up at: Follow-up Information    [facility]. Go on 12/29/2017.   Why:  10:30am For Hospital Follow up with Dr. Melrose Nakayama. Contact information: [address] [place], [place]  [address] [phone] Phone  Care Team: Dr. Susy Frizzle, MD [address] [place], [place]  [address] [phone] Phone [phone]          Next level of care provider has access to [facility] and Suicide Prevention discussed: Yes,     Have you used any form of tobacco in the last 30 days? (Cigarettes, Smokeless Tobacco, Cigars, and/or Pipes): Yes  Has patient been referred to the Quitline?: Patient refused referral  Patient has been referred for addiction treatment: Yes  August Saucer, LCSW 12/14/2017, 4:13 PM

## 2017-12-14 NOTE — Plan of Care (Signed)
No psychosis or bizarre behavior , thoughts are organized and coherent.

## 2017-12-14 NOTE — Plan of Care (Signed)
D: Pt denies SI/HI/AV hallucinations. Pt is pleasant and cooperative. Pt goal for today is work in discharge home. A: Pt was offered support and encouragement. Pt was given scheduled medications. Pt was encourage to attend groups. Q 15 minute checks were done for safety.  R:Pt attends groups and interacts well with peers and staff. Pt is taking medication. Pt has no complaints.Pt receptive to treatment and safety maintained on unit.    Problem: Safety: Goal: Ability to remain free from injury will improve Outcome: Progressing Note:  Patient denies SI and remains safe on unit.

## 2017-12-14 NOTE — Progress Notes (Signed)
Recreation Therapy Notes  Date: 12/11/2017  Time: 9:30 am  Location: Craft [facility]  Behavioral response: Appropriate   Intervention Topic: Leisure  Discussion/Intervention:  Group content today was focused on leisure. The group defined what leisure is and some positive leisure activities they participate in. Individuals identified the difference between good and bad leisure. Participants expressed how they feel after participating in the leisure of their choice. The group discussed how they go about picking a leisure activity and if others are involved in their leisure activities. The patient stated how many leisure activities they too choose from and reasons why it is important to have leisure time. Individuals participated in the intervention "Leisure Jeopardy" where they had a chance to identify new leisure activities as well as benefits of leisure. Clinical Observations/Feedback:  Patient came to group and was focused on what his peers and staff had to say about leisure. He participated in the intervention and was social with peers and staff during group. Matt Delpizzo LRT/CTRS         Michail Boyte 12/14/2017 11:57 AM

## 2017-12-14 NOTE — BHH Suicide Risk Assessment (Signed)
[facility] INPATIENT:  Family/Significant Other Suicide Prevention Education  Suicide Prevention Education:  Patient Refusal for Family/Significant Other Suicide Prevention Education: The patient Adam Keller has refused to provide written consent for family/significant other to be provided Family/Significant Other Suicide Prevention Education during admission and/or prior to discharge.  Physician notified.  Carloyn Jaeger Aroldo Galli 12/14/2017, 4:16 PM

## 2017-12-14 NOTE — Progress Notes (Signed)
Recreation Therapy Notes  INPATIENT RECREATION TR PLAN  Patient Details Name: Adam Keller MRN: [id number] DOB: Jun 23, 1956 Today's Date: 12/14/2017  Rec Therapy Plan Is patient appropriate for Therapeutic Recreation?: Yes Treatment times per week: at least 3 Estimated Length of Stay: 5-7 days TR Treatment/Interventions: Group participation (Comment)  Discharge Criteria Pt will be discharged from therapy if:: Discharged Treatment plan/goals/alternatives discussed and agreed upon by:: Patient/family  Discharge Summary Short term goals set: Patient will cooperate with redirection given by staff within 5 recreation therapy group sessions, with no more than 3 prompts Short term goals met: Complete Progress toward goals comments: Groups attended Which groups?: AAA/T, Leisure education, Other (Comment)(Happiness) Reason goals not met: N/A Therapeutic equipment acquired: N/A Reason patient discharged from therapy: Discharge from hospital Pt/family agrees with progress & goals achieved: Yes Date patient discharged from therapy: 12/14/17   Takeru Bose 12/14/2017, 12:00 PM

## 2017-12-14 NOTE — Progress Notes (Signed)
Patient ID: GRANTHAM HIPPERT, male   DOB: 09-25-1955, 62 y.o.   MRN: [id number] Pt's sister Shone Leventhal called leaving voicemail with concerns about Pt.  CSW did not have release to talk with her, however notified Pt that she had called and asked if it would be okay to call her back, he declined saying he would call her when he got home.  CSW received call later in the day again from Pt's sister and informed her that there was no release to speak with her about his plans or condition. She verbalized concerns about his discharge and not knowing what was going on. She states she had been calling him and he wasn't answering.  (He was just being walked out to taxi approximately 5 minutes before she called CSW)  She says he still has guns in the home and hung up.  Dossie Arbour, LCSW

## 2017-12-14 NOTE — Progress Notes (Signed)
Recreation Therapy Notes Date: 12/14/2017  Time: 9:30 am  Location: Craft Room  Behavioral response: Appropriate   Intervention Topic: Happiness  Discussion/Intervention:  Group content today was focused on Happiness. The group defined happiness and stated reasons they are and are not happy at times. Participants identified reasons they are normally happy and why. Individuals expressed how not being happy affects themselves and others. Patients stated reasons why happiness is important to them. The group described how they feel when they are happy. Individuals participated in the intervention "What is happiness" where they defined what happiness means to them.  Clinical Observations/Feedback:  Patient came to group and identified happiness as a uplifting emotion. He stated that sharing things makes him happy. Individual explained to the group that he feels joys is something you can hold onto and happiness can be taken away. Patient was social with peers and staff while participating in the intervention.  Miciah Covelli LRT/CTRS        Christeena Krogh 12/14/2017 11:38 AM

## 2017-12-14 NOTE — Progress Notes (Signed)
Patient is alert and oriented x 4 no distress noted. Patient's thought are organized  And coherent no bizarre behavior. Patient's speech is non pressured, non tangential, no   distress noted. Patient denies SI/HI/AVH, complaint with medication and interacting appropriately with peers and staff.15 minutes safety checks maintained will continue to monitor.
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
## 2017-12-29 DIAGNOSIS — F3174 Bipolar disorder, in full remission, most recent episode manic: Secondary | ICD-10-CM | POA: Diagnosis not present

## 2018-01-04 ENCOUNTER — Ambulatory Visit: Payer: Federal, State, Local not specified - PPO | Admitting: Internal Medicine

## 2018-01-04 ENCOUNTER — Encounter: Payer: Self-pay | Admitting: Internal Medicine

## 2018-01-04 DIAGNOSIS — N400 Enlarged prostate without lower urinary tract symptoms: Secondary | ICD-10-CM | POA: Diagnosis not present

## 2018-01-04 DIAGNOSIS — F319 Bipolar disorder, unspecified: Secondary | ICD-10-CM | POA: Diagnosis not present

## 2018-01-04 DIAGNOSIS — F4321 Adjustment disorder with depressed mood: Secondary | ICD-10-CM

## 2018-01-04 DIAGNOSIS — F312 Bipolar disorder, current episode manic severe with psychotic features: Secondary | ICD-10-CM | POA: Diagnosis not present

## 2018-01-04 DIAGNOSIS — E039 Hypothyroidism, unspecified: Secondary | ICD-10-CM

## 2018-01-04 NOTE — Assessment & Plan Note (Signed)
Repeat hospital stay 5/19 Dr Melrose Nakayama - [facility]

## 2018-01-04 NOTE — Assessment & Plan Note (Signed)
On Levothroid 

## 2018-01-04 NOTE — Assessment & Plan Note (Addendum)
Repeat hospital stay 5/19 F/u Dr Melrose Nakayama - [facility] Lithium and Zyprexa were upped tremor R>L[facility] w/Dr Melrose Nakayama

## 2018-01-04 NOTE — Assessment & Plan Note (Signed)
Avodart
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
## 2018-01-04 NOTE — Assessment & Plan Note (Signed)
Discussed.

## 2018-01-27 ENCOUNTER — Other Ambulatory Visit: Payer: Self-pay

## 2018-01-27 MED ORDER — MOMETASONE FUROATE 50 MCG/ACT NA SUSP: 2.0000 | Freq: Every day | NASAL | 3 refills | Status: DC

## 2018-02-01 DIAGNOSIS — F5101 Primary insomnia: Secondary | ICD-10-CM | POA: Diagnosis not present

## 2018-02-01 DIAGNOSIS — F3174 Bipolar disorder, in full remission, most recent episode manic: Secondary | ICD-10-CM | POA: Diagnosis not present

## 2018-02-17 DIAGNOSIS — K148 Other diseases of tongue: Secondary | ICD-10-CM | POA: Diagnosis not present

## 2018-02-23 DIAGNOSIS — F5101 Primary insomnia: Secondary | ICD-10-CM | POA: Diagnosis not present

## 2018-02-23 DIAGNOSIS — F3174 Bipolar disorder, in full remission, most recent episode manic: Secondary | ICD-10-CM | POA: Diagnosis not present

## 2018-03-09 DIAGNOSIS — H1045 Other chronic allergic conjunctivitis: Secondary | ICD-10-CM | POA: Diagnosis not present

## 2018-03-11 DIAGNOSIS — K080 Exfoliation of teeth due to systemic causes: Secondary | ICD-10-CM | POA: Diagnosis not present

## 2018-04-07 ENCOUNTER — Encounter: Payer: Self-pay | Admitting: Internal Medicine

## 2018-04-07 ENCOUNTER — Other Ambulatory Visit (INDEPENDENT_AMBULATORY_CARE_PROVIDER_SITE_OTHER): Payer: Federal, State, Local not specified - PPO

## 2018-04-07 ENCOUNTER — Ambulatory Visit: Payer: Federal, State, Local not specified - PPO | Admitting: Internal Medicine

## 2018-04-07 VITALS — BP 124/74 | HR 71 | Temp 98.3°F | Ht 72.0 in | Wt 165.0 lb

## 2018-04-07 DIAGNOSIS — Z23 Encounter for immunization: Secondary | ICD-10-CM | POA: Diagnosis not present

## 2018-04-07 DIAGNOSIS — F319 Bipolar disorder, unspecified: Secondary | ICD-10-CM

## 2018-04-07 DIAGNOSIS — F4321 Adjustment disorder with depressed mood: Secondary | ICD-10-CM | POA: Diagnosis not present

## 2018-04-07 DIAGNOSIS — R634 Abnormal weight loss: Secondary | ICD-10-CM

## 2018-04-07 DIAGNOSIS — R972 Elevated prostate specific antigen [PSA]: Secondary | ICD-10-CM

## 2018-04-07 LAB — CBC WITH DIFFERENTIAL/PLATELET
Basophils Absolute: 0.1 10*3/uL (ref 0.0–0.1)
Basophils Relative: 2.2 % (ref 0.0–3.0)
EOS PCT: 9.3 % — AB (ref 0.0–5.0)
Eosinophils Absolute: 0.4 10*3/uL (ref 0.0–0.7)
HCT: 45.2 % (ref 39.0–52.0)
Hemoglobin: 15.6 g/dL (ref 13.0–17.0)
Lymphocytes Relative: 28 % (ref 12.0–46.0)
Lymphs Abs: 1.2 10*3/uL (ref 0.7–4.0)
MCHC: 34.5 g/dL (ref 30.0–36.0)
MCV: 94.8 fl (ref 78.0–100.0)
MONO ABS: 0.3 10*3/uL (ref 0.1–1.0)
MONOS PCT: 7 % (ref 3.0–12.0)
NEUTROS ABS: 2.3 10*3/uL (ref 1.4–7.7)
NEUTROS PCT: 53.5 % (ref 43.0–77.0)
PLATELETS: 180 10*3/uL (ref 150.0–400.0)
RBC: 4.77 Mil/uL (ref 4.22–5.81)
RDW: 12.6 % (ref 11.5–15.5)
WBC: 4.3 10*3/uL (ref 4.0–10.5)

## 2018-04-07 LAB — BASIC METABOLIC PANEL
BUN: 11 mg/dL (ref 6–23)
CALCIUM: 10 mg/dL (ref 8.4–10.5)
CO2: 27 mEq/L (ref 19–32)
CREATININE: 0.93 mg/dL (ref 0.40–1.50)
Chloride: 106 mEq/L (ref 96–112)
GFR: 87.8 mL/min (ref >60.00)
Glucose, Bld: 97 mg/dL (ref 70–99)
Potassium: 4.2 mEq/L (ref 3.5–5.1)
SODIUM: 141 meq/L (ref 135–145)

## 2018-04-07 LAB — HEPATIC FUNCTION PANEL
ALBUMIN: 4.7 g/dL (ref 3.5–5.2)
ALK PHOS: 64 U/L (ref 39–117)
ALT: 10 U/L (ref 0–53)
AST: 19 U/L (ref 0–37)
BILIRUBIN DIRECT: 0.1 mg/dL (ref 0.0–0.3)
Total Bilirubin: 0.5 mg/dL (ref 0.2–1.2)
Total Protein: 6.9 g/dL (ref 6.0–8.3)

## 2018-04-07 LAB — TSH: TSH: 3.55 u[IU]/mL (ref 0.35–4.50)

## 2018-04-07 LAB — T4, FREE: Free T4: 0.85 ng/dL (ref 0.60–1.60)

## 2018-04-07 NOTE — Addendum Note (Signed)
Addended by: Karren Cobble on: 04/07/2018 10:30 AM   Modules accepted: Orders

## 2018-04-07 NOTE — Assessment & Plan Note (Signed)
Coping ok 

## 2018-04-07 NOTE — Progress Notes (Signed)
Subjective:  Patient ID: Adam Keller, male    DOB: 1955/12/19  Age: 62 y.o. MRN: [id number]  CC: No chief complaint on file.   HPI Cullan Launer Mcpheeters presents for OCD depression, grief, hypothyroidism f/u  Outpatient Medications Prior to Visit  Medication Sig Dispense Refill  . Cholecalciferol 1000 UNITS tablet Take 1,000 Units by mouth daily.      . clonazePAM (KLONOPIN) 0.5 MG tablet Take 0.5 mg by mouth 2 (two) times daily as needed for anxiety.    . colesevelam (WELCHOL) 625 MG tablet Take 625-1,250 mg by mouth as needed. For diarrhea     . dutasteride (AVODART) 0.5 MG capsule Take 1 capsule (0.5 mg total) by mouth every other day. 15 capsule 1  . fish oil-omega-3 fatty acids 1000 MG capsule Take 1 g by mouth daily.      . hydrocortisone cream 1 % Apply topically 2 (two) times daily. 30 g 0  . lamoTRIgine (LAMICTAL) 200 MG tablet Take 1 tablet (200 mg total) by mouth at bedtime. 30 tablet 1  . levothyroxine (SYNTHROID) 50 MCG tablet Take 1.5 tablets (75 mcg total) by mouth daily before breakfast. 135 tablet 3  . lithium carbonate 600 MG capsule Take 1 capsule (600 mg total) by mouth 2 (two) times daily with a meal. 60 capsule 1  . mometasone (NASONEX) 50 MCG/ACT nasal spray Place 2 sprays into the nose daily. 51 g 3  . montelukast (SINGULAIR) 10 MG tablet Take 1 tablet (10 mg total) by mouth daily. Yearly physical due in May must see MD for future refills 90 tablet 3  . Multiple Vitamins-Minerals (MULTIVITAMIN,TX-MINERALS) tablet Take 1 tablet by mouth daily.      Marland Kitchen OLANZapine (ZYPREXA) 15 MG tablet Take by mouth.    Marland Kitchen omeprazole (PRILOSEC) 40 MG capsule Take 1 capsule (40 mg total) by mouth daily as needed. 30 capsule 2  . OLANZapine (ZYPREXA) 15 MG tablet Take 2 tablets (30 mg total) by mouth at bedtime. 60 tablet 1  . zaleplon (SONATA) 5 MG capsule Take 1 capsule (5 mg total) by mouth at bedtime. (Patient not taking: Reported on 04/07/2018) 30 capsule 1   No facility-administered  medications prior to visit.     ROS: Review of Systems  Constitutional: Negative for appetite change, fatigue and unexpected weight change.  HENT: Negative for congestion, nosebleeds, sneezing, sore throat and trouble swallowing.   Eyes: Negative for itching and visual disturbance.  Respiratory: Negative for cough.   Cardiovascular: Negative for chest pain, palpitations and leg swelling.  Gastrointestinal: Negative for abdominal distention, blood in stool, diarrhea and nausea.  Genitourinary: Negative for frequency and hematuria.  Musculoskeletal: Negative for back pain, gait problem, joint swelling and neck pain.  Skin: Negative for rash.  Neurological: Negative for dizziness, tremors, speech difficulty and weakness.  Hematological: Does not bruise/bleed easily.  Psychiatric/Behavioral: Negative for agitation, behavioral problems, confusion, dysphoric mood, hallucinations, self-injury, sleep disturbance and suicidal ideas. The patient is not nervous/anxious.     Objective:  BP 124/74 (BP Location: Left Arm, Patient Position: Sitting, Cuff Size: Normal)   Pulse 71   Temp 98.3 F (36.8 C) (Oral)   Ht 6' (1.829 m)   Wt 165 lb (74.8 kg)   SpO2 99%   BMI 22.38 kg/m   BP Readings from Last 3 Encounters:  04/07/18 124/74  01/04/18 116/72  12/02/17 131/89    Wt Readings from Last 3 Encounters:  04/07/18 165 lb (74.8 kg)  01/04/18 162 lb (  73.5 kg)  12/02/17 152 lb (68.9 kg)    Physical Exam  Constitutional: He is oriented to person, place, and time. He appears well-developed. No distress.  NAD  HENT:  Mouth/Throat: Oropharynx is clear and moist.  Eyes: Pupils are equal, round, and reactive to light. Conjunctivae are normal.  Neck: Normal range of motion. No JVD present. No thyromegaly present.  Cardiovascular: Normal rate, regular rhythm, normal heart sounds and intact distal pulses. Exam reveals no gallop and no friction rub.  No murmur heard. Pulmonary/Chest: Effort  normal and breath sounds normal. No respiratory distress. He has no wheezes. He has no rales. He exhibits no tenderness.  Abdominal: Soft. Bowel sounds are normal. He exhibits no distension and no mass. There is no tenderness. There is no rebound and no guarding.  Musculoskeletal: Normal range of motion. He exhibits no edema or tenderness.  Lymphadenopathy:    He has no cervical adenopathy.  Neurological: He is alert and oriented to person, place, and time. He has normal reflexes. No cranial nerve deficit. He exhibits normal muscle tone. He displays a negative Romberg sign. Coordination and gait normal.  Skin: Skin is warm and dry. No rash noted.  Psychiatric: He has a normal mood and affect. His behavior is normal. Judgment and thought content normal.    Lab Results  Component Value Date   WBC 8.3 12/02/2017   HGB 16.0 12/02/2017   HCT 45.9 12/02/2017   PLT 234 12/02/2017   GLUCOSE 105 (H) 12/11/2017   CHOL 204 (H) 12/03/2017   TRIG 35 12/03/2017   HDL 71 12/03/2017   LDLDIRECT 116.5 01/02/2011   LDLCALC 126 (H) 12/03/2017   ALT 11 (L) 12/11/2017   AST 28 12/11/2017   NA 139 12/11/2017   K 4.2 12/11/2017   CL 108 12/11/2017   CREATININE 0.81 12/11/2017   BUN 16 12/11/2017   CO2 25 12/11/2017   TSH 3.086 12/03/2017   PSA 5.20 (H) 05/08/2014   HGBA1C 4.7 (L) 12/03/2017    No results found.  Assessment & Plan:   There are no diagnoses linked to this encounter.   No orders of the defined types were placed in this encounter.    Follow-up: No follow-ups on file.  Walker Kehr, MD

## 2018-04-07 NOTE — Assessment & Plan Note (Signed)
Wt Readings from Last 3 Encounters:  04/07/18 165 lb (74.8 kg)  01/04/18 162 lb (73.5 kg)  12/02/17 152 lb (68.9 kg)

## 2018-04-07 NOTE — Assessment & Plan Note (Signed)
No relapse per pt

## 2018-04-07 NOTE — Assessment & Plan Note (Signed)
Dr D q 6 mo

## 2018-04-08 LAB — LITHIUM LEVEL: Lithium Lvl: 0.6 mmol/L (ref 0.6–1.2)

## 2018-04-26 DIAGNOSIS — F5101 Primary insomnia: Secondary | ICD-10-CM | POA: Diagnosis not present

## 2018-04-26 DIAGNOSIS — F3174 Bipolar disorder, in full remission, most recent episode manic: Secondary | ICD-10-CM | POA: Diagnosis not present

## 2018-05-19 DIAGNOSIS — R972 Elevated prostate specific antigen [PSA]: Secondary | ICD-10-CM | POA: Diagnosis not present

## 2018-05-26 DIAGNOSIS — R972 Elevated prostate specific antigen [PSA]: Secondary | ICD-10-CM | POA: Diagnosis not present

## 2018-07-26 DIAGNOSIS — F5101 Primary insomnia: Secondary | ICD-10-CM | POA: Diagnosis not present

## 2018-07-26 DIAGNOSIS — F3174 Bipolar disorder, in full remission, most recent episode manic: Secondary | ICD-10-CM | POA: Diagnosis not present

## 2018-08-10 ENCOUNTER — Encounter: Payer: Self-pay | Admitting: Internal Medicine

## 2018-08-10 ENCOUNTER — Ambulatory Visit: Payer: Federal, State, Local not specified - PPO | Admitting: Internal Medicine

## 2018-08-10 ENCOUNTER — Other Ambulatory Visit (INDEPENDENT_AMBULATORY_CARE_PROVIDER_SITE_OTHER): Payer: Federal, State, Local not specified - PPO

## 2018-08-10 VITALS — BP 124/76 | HR 70 | Temp 97.5°F | Ht 72.0 in | Wt 174.0 lb

## 2018-08-10 DIAGNOSIS — F312 Bipolar disorder, current episode manic severe with psychotic features: Secondary | ICD-10-CM

## 2018-08-10 DIAGNOSIS — R739 Hyperglycemia, unspecified: Secondary | ICD-10-CM | POA: Diagnosis not present

## 2018-08-10 DIAGNOSIS — E039 Hypothyroidism, unspecified: Secondary | ICD-10-CM | POA: Diagnosis not present

## 2018-08-10 DIAGNOSIS — E785 Hyperlipidemia, unspecified: Secondary | ICD-10-CM

## 2018-08-10 DIAGNOSIS — F4321 Adjustment disorder with depressed mood: Secondary | ICD-10-CM

## 2018-08-10 LAB — BASIC METABOLIC PANEL
BUN: 11 mg/dL (ref 6–23)
CO2: 26 mEq/L (ref 19–32)
Calcium: 10.3 mg/dL (ref 8.4–10.5)
Chloride: 106 mEq/L (ref 96–112)
Creatinine, Ser: 0.92 mg/dL (ref 0.40–1.50)
GFR: 83.55 mL/min (ref >60.00)
Glucose, Bld: 101 mg/dL — ABNORMAL HIGH (ref 70–99)
Potassium: 4.3 mEq/L (ref 3.5–5.1)
Sodium: 139 mEq/L (ref 135–145)

## 2018-08-10 LAB — CBC WITH DIFFERENTIAL/PLATELET
Basophils Absolute: 0.1 10*3/uL (ref 0.0–0.1)
Basophils Relative: 1.4 % (ref 0.0–3.0)
Eosinophils Absolute: 0.2 10*3/uL (ref 0.0–0.7)
Eosinophils Relative: 5.3 % — ABNORMAL HIGH (ref 0.0–5.0)
HCT: 46.8 % (ref 39.0–52.0)
Hemoglobin: 16.1 g/dL (ref 13.0–17.0)
Lymphocytes Relative: 25.4 % (ref 12.0–46.0)
Lymphs Abs: 1.1 10*3/uL (ref 0.7–4.0)
MCHC: 34.4 g/dL (ref 30.0–36.0)
MCV: 93.8 fl (ref 78.0–100.0)
Monocytes Absolute: 0.3 10*3/uL (ref 0.1–1.0)
Monocytes Relative: 7.7 % (ref 3.0–12.0)
Neutro Abs: 2.7 10*3/uL (ref 1.4–7.7)
Neutrophils Relative %: 60.2 % (ref 43.0–77.0)
Platelets: 179 10*3/uL (ref 150.0–400.0)
RBC: 4.99 Mil/uL (ref 4.22–5.81)
RDW: 12.9 % (ref 11.5–15.5)
WBC: 4.4 10*3/uL (ref 4.0–10.5)

## 2018-08-10 LAB — HEPATIC FUNCTION PANEL
ALT: 9 U/L (ref 0–53)
AST: 20 U/L (ref 0–37)
Albumin: 4.7 g/dL (ref 3.5–5.2)
Alkaline Phosphatase: 60 U/L (ref 39–117)
Bilirubin, Direct: 0.1 mg/dL (ref 0.0–0.3)
Total Bilirubin: 0.6 mg/dL (ref 0.2–1.2)
Total Protein: 6.3 g/dL (ref 6.0–8.3)

## 2018-08-10 LAB — TSH: TSH: 1.78 u[IU]/mL (ref 0.35–4.50)

## 2018-08-10 LAB — T4, FREE: Free T4: 0.87 ng/dL (ref 0.60–1.60)

## 2018-08-10 NOTE — Assessment & Plan Note (Signed)
Discussed.

## 2018-08-10 NOTE — Progress Notes (Signed)
Subjective:  Patient ID: Adam Keller, male    DOB: 1955-08-16  Age: 63 y.o. MRN: [id number]  CC: No chief complaint on file.   HPI Adam Keller presents for dyslipidemia, hypothyroidism, bipolar disorder  Outpatient Medications Prior to Visit  Medication Sig Dispense Refill  . Cholecalciferol 1000 UNITS tablet Take 1,000 Units by mouth daily.      . colesevelam (WELCHOL) 625 MG tablet Take 625-1,250 mg by mouth as needed. For diarrhea     . dutasteride (AVODART) 0.5 MG capsule Take 1 capsule (0.5 mg total) by mouth every other day. 15 capsule 1  . fish oil-omega-3 fatty acids 1000 MG capsule Take 1 g by mouth daily.      . hydrocortisone cream 1 % Apply topically 2 (two) times daily. 30 g 0  . lamoTRIgine (LAMICTAL) 200 MG tablet Take 1 tablet (200 mg total) by mouth at bedtime. 30 tablet 1  . levothyroxine (SYNTHROID) 50 MCG tablet Take 1.5 tablets (75 mcg total) by mouth daily before breakfast. 135 tablet 3  . lithium carbonate 600 MG capsule Take 1 capsule (600 mg total) by mouth 2 (two) times daily with a meal. 60 capsule 1  . mometasone (NASONEX) 50 MCG/ACT nasal spray Place 2 sprays into the nose daily. 51 g 3  . montelukast (SINGULAIR) 10 MG tablet Take 1 tablet (10 mg total) by mouth daily. Yearly physical due in May must see MD for future refills 90 tablet 3  . Multiple Vitamins-Minerals (MULTIVITAMIN,TX-MINERALS) tablet Take 1 tablet by mouth daily.      Marland Kitchen OLANZapine (ZYPREXA) 15 MG tablet Take by mouth.    Marland Kitchen omeprazole (PRILOSEC) 40 MG capsule Take 1 capsule (40 mg total) by mouth daily as needed. 30 capsule 2  . clonazePAM (KLONOPIN) 0.5 MG tablet Take 0.5 mg by mouth 2 (two) times daily as needed for anxiety.     No facility-administered medications prior to visit.     ROS: Review of Systems  Constitutional: Negative for appetite change, fatigue and unexpected weight change.  HENT: Negative for congestion, nosebleeds, sneezing, sore throat and trouble swallowing.     Eyes: Negative for itching and visual disturbance.  Respiratory: Negative for cough.   Cardiovascular: Negative for chest pain, palpitations and leg swelling.  Gastrointestinal: Negative for abdominal distention, blood in stool, diarrhea and nausea.  Genitourinary: Negative for frequency and hematuria.  Musculoskeletal: Negative for back pain, gait problem, joint swelling and neck pain.  Skin: Negative for rash.  Neurological: Negative for dizziness, tremors, speech difficulty and weakness.  Psychiatric/Behavioral: Negative for agitation, dysphoric mood, sleep disturbance and suicidal ideas. The patient is nervous/anxious.     Objective:  BP 124/76 (BP Location: Left Arm, Patient Position: Sitting, Cuff Size: Normal)   Pulse 70   Temp (!) 97.5 F (36.4 C) (Oral)   Ht 6' (1.829 m)   Wt 174 lb (78.9 kg)   SpO2 98%   BMI 23.60 kg/m   BP Readings from Last 3 Encounters:  08/10/18 124/76  04/07/18 124/74  01/04/18 116/72    Wt Readings from Last 3 Encounters:  08/10/18 174 lb (78.9 kg)  04/07/18 165 lb (74.8 kg)  01/04/18 162 lb (73.5 kg)    Physical Exam Constitutional:      General: He is not in acute distress.    Appearance: He is well-developed.     Comments: NAD  Eyes:     Conjunctiva/sclera: Conjunctivae normal.     Pupils: Pupils are equal, round,  and reactive to light.  Neck:     Musculoskeletal: Normal range of motion.     Thyroid: No thyromegaly.     Vascular: No JVD.  Cardiovascular:     Rate and Rhythm: Normal rate and regular rhythm.     Heart sounds: Normal heart sounds. No murmur. No friction rub. No gallop.   Pulmonary:     Effort: Pulmonary effort is normal. No respiratory distress.     Breath sounds: Normal breath sounds. No wheezing or rales.  Chest:     Chest wall: No tenderness.  Abdominal:     General: Bowel sounds are normal. There is no distension.     Palpations: Abdomen is soft. There is no mass.     Tenderness: There is no abdominal  tenderness. There is no guarding or rebound.  Musculoskeletal: Normal range of motion.        General: No tenderness.  Lymphadenopathy:     Cervical: No cervical adenopathy.  Skin:    General: Skin is warm and dry.     Findings: No rash.  Neurological:     Mental Status: He is alert and oriented to person, place, and time.     Cranial Nerves: No cranial nerve deficit.     Motor: No abnormal muscle tone.     Coordination: Coordination normal.     Gait: Gait normal.     Deep Tendon Reflexes: Reflexes are normal and symmetric.  Psychiatric:        Behavior: Behavior normal.        Thought Content: Thought content normal.        Judgment: Judgment normal.     Lab Results  Component Value Date   WBC 4.3 04/07/2018   HGB 15.6 04/07/2018   HCT 45.2 04/07/2018   PLT 180.0 04/07/2018   GLUCOSE 97 04/07/2018   CHOL 204 (H) 12/03/2017   TRIG 35 12/03/2017   HDL 71 12/03/2017   LDLDIRECT 116.5 01/02/2011   LDLCALC 126 (H) 12/03/2017   ALT 10 04/07/2018   AST 19 04/07/2018   NA 141 04/07/2018   K 4.2 04/07/2018   CL 106 04/07/2018   CREATININE 0.93 04/07/2018   BUN 11 04/07/2018   CO2 27 04/07/2018   TSH 3.55 04/07/2018   PSA 5.20 (H) 05/08/2014   HGBA1C 4.7 (L) 12/03/2017    No results found.  Assessment & Plan:   There are no diagnoses linked to this encounter.   No orders of the defined types were placed in this encounter.    Follow-up: No follow-ups on file.  Walker Kehr, MD

## 2018-08-10 NOTE — Assessment & Plan Note (Signed)
Labs

## 2018-08-10 NOTE — Patient Instructions (Signed)

## 2018-08-10 NOTE — Assessment & Plan Note (Signed)
Lithium levels,LFT, CBC

## 2018-08-10 NOTE — Assessment & Plan Note (Addendum)
Dyslipidemia A cardiac CT scan for coronary calcium offered

## 2018-08-10 NOTE — Assessment & Plan Note (Signed)
Levothroid 

## 2018-08-11 LAB — LITHIUM LEVEL: Lithium Lvl: 0.8 mmol/L (ref 0.6–1.2)

## 2018-08-18 DIAGNOSIS — K148 Other diseases of tongue: Secondary | ICD-10-CM | POA: Diagnosis not present

## 2018-09-22 DIAGNOSIS — K080 Exfoliation of teeth due to systemic causes: Secondary | ICD-10-CM | POA: Diagnosis not present

## 2018-10-25 DIAGNOSIS — F3174 Bipolar disorder, in full remission, most recent episode manic: Secondary | ICD-10-CM | POA: Diagnosis not present

## 2018-11-10 ENCOUNTER — Ambulatory Visit: Payer: Federal, State, Local not specified - PPO | Admitting: Internal Medicine

## 2018-11-22 DIAGNOSIS — R972 Elevated prostate specific antigen [PSA]: Secondary | ICD-10-CM | POA: Diagnosis not present

## 2019-01-10 ENCOUNTER — Encounter: Payer: Self-pay | Admitting: Internal Medicine

## 2019-01-10 ENCOUNTER — Ambulatory Visit (INDEPENDENT_AMBULATORY_CARE_PROVIDER_SITE_OTHER): Payer: Federal, State, Local not specified - PPO | Admitting: Internal Medicine

## 2019-01-10 ENCOUNTER — Other Ambulatory Visit (INDEPENDENT_AMBULATORY_CARE_PROVIDER_SITE_OTHER): Payer: Federal, State, Local not specified - PPO

## 2019-01-10 ENCOUNTER — Other Ambulatory Visit: Payer: Self-pay

## 2019-01-10 VITALS — BP 124/80 | HR 79 | Temp 98.5°F | Ht 72.0 in | Wt 166.0 lb

## 2019-01-10 DIAGNOSIS — E039 Hypothyroidism, unspecified: Secondary | ICD-10-CM

## 2019-01-10 DIAGNOSIS — R739 Hyperglycemia, unspecified: Secondary | ICD-10-CM

## 2019-01-10 DIAGNOSIS — R634 Abnormal weight loss: Secondary | ICD-10-CM

## 2019-01-10 DIAGNOSIS — F319 Bipolar disorder, unspecified: Secondary | ICD-10-CM

## 2019-01-10 LAB — CBC WITH DIFFERENTIAL/PLATELET
Basophils Absolute: 0.1 10*3/uL (ref 0.0–0.1)
Basophils Relative: 2.1 % (ref 0.0–3.0)
Eosinophils Absolute: 0.3 10*3/uL (ref 0.0–0.7)
Eosinophils Relative: 6.1 % — ABNORMAL HIGH (ref 0.0–5.0)
HCT: 44.8 % (ref 39.0–52.0)
Hemoglobin: 15.7 g/dL (ref 13.0–17.0)
Lymphocytes Relative: 24.5 % (ref 12.0–46.0)
Lymphs Abs: 1.1 10*3/uL (ref 0.7–4.0)
MCHC: 35 g/dL (ref 30.0–36.0)
MCV: 93.9 fl (ref 78.0–100.0)
Monocytes Absolute: 0.3 10*3/uL (ref 0.1–1.0)
Monocytes Relative: 7 % (ref 3.0–12.0)
Neutro Abs: 2.8 10*3/uL (ref 1.4–7.7)
Neutrophils Relative %: 60.3 % (ref 43.0–77.0)
Platelets: 205 10*3/uL (ref 150.0–400.0)
RBC: 4.78 Mil/uL (ref 4.22–5.81)
RDW: 12.5 % (ref 11.5–15.5)
WBC: 4.6 10*3/uL (ref 4.0–10.5)

## 2019-01-10 LAB — T4, FREE: Free T4: 0.86 ng/dL (ref 0.60–1.60)

## 2019-01-10 LAB — TSH: TSH: 0.81 u[IU]/mL (ref 0.35–4.50)

## 2019-01-10 NOTE — Assessment & Plan Note (Signed)
Wt Readings from Last 3 Encounters:  01/10/19 166 lb (75.3 kg)  08/10/18 174 lb (78.9 kg)  04/07/18 165 lb (74.8 kg)

## 2019-01-10 NOTE — Assessment & Plan Note (Signed)
Stable on Lithium, Zyprexa

## 2019-01-10 NOTE — Assessment & Plan Note (Signed)
Labs

## 2019-01-10 NOTE — Assessment & Plan Note (Signed)
On Levothroid 

## 2019-01-10 NOTE — Progress Notes (Signed)
Subjective:  Patient ID: Adam Keller, male    DOB: 1955/09/22  Age: 63 y.o. MRN: [id number]  CC: No chief complaint on file.   HPI Adam Keller presents for BPH, depression, hypothyroidism f/u  Outpatient Medications Prior to Visit  Medication Sig Dispense Refill  . Cholecalciferol 1000 UNITS tablet Take 1,000 Units by mouth daily.      . colesevelam (WELCHOL) 625 MG tablet Take 625-1,250 mg by mouth as needed. For diarrhea     . dutasteride (AVODART) 0.5 MG capsule Take 1 capsule (0.5 mg total) by mouth every other day. 15 capsule 1  . fish oil-omega-3 fatty acids 1000 MG capsule Take 1 g by mouth daily.      . hydrocortisone cream 1 % Apply topically 2 (two) times daily. 30 g 0  . lamoTRIgine (LAMICTAL) 200 MG tablet Take 1 tablet (200 mg total) by mouth at bedtime. 30 tablet 1  . levothyroxine (SYNTHROID) 50 MCG tablet Take 1.5 tablets (75 mcg total) by mouth daily before breakfast. 135 tablet 3  . lithium carbonate 600 MG capsule Take 1 capsule (600 mg total) by mouth 2 (two) times daily with a meal. 60 capsule 1  . mometasone (NASONEX) 50 MCG/ACT nasal spray Place 2 sprays into the nose daily. 51 g 3  . montelukast (SINGULAIR) 10 MG tablet Take 1 tablet (10 mg total) by mouth daily. Yearly physical due in May must see MD for future refills 90 tablet 3  . Multiple Vitamins-Minerals (MULTIVITAMIN,TX-MINERALS) tablet Take 1 tablet by mouth daily.      Marland Kitchen OLANZapine (ZYPREXA) 15 MG tablet Take by mouth.    Marland Kitchen omeprazole (PRILOSEC) 40 MG capsule Take 1 capsule (40 mg total) by mouth daily as needed. 30 capsule 2   No facility-administered medications prior to visit.     ROS: Review of Systems  Constitutional: Negative for appetite change, fatigue and unexpected weight change.  HENT: Negative for congestion, nosebleeds, sneezing, sore throat and trouble swallowing.   Eyes: Negative for itching and visual disturbance.  Respiratory: Negative for cough.   Cardiovascular: Negative for  chest pain, palpitations and leg swelling.  Gastrointestinal: Negative for abdominal distention, blood in stool, diarrhea and nausea.  Genitourinary: Negative for frequency and hematuria.  Musculoskeletal: Negative for back pain, gait problem, joint swelling and neck pain.  Skin: Negative for rash.  Neurological: Negative for dizziness, tremors, speech difficulty and weakness.  Psychiatric/Behavioral: Negative for agitation, dysphoric mood, sleep disturbance and suicidal ideas. The patient is nervous/anxious.     Objective:  BP 124/80 (BP Location: Left Arm, Patient Position: Sitting, Cuff Size: Normal)   Pulse 79   Temp 98.5 F (36.9 C) (Oral)   Ht 6' (1.829 m)   Wt 166 lb (75.3 kg)   SpO2 96%   BMI 22.51 kg/m   BP Readings from Last 3 Encounters:  01/10/19 124/80  08/10/18 124/76  04/07/18 124/74    Wt Readings from Last 3 Encounters:  01/10/19 166 lb (75.3 kg)  08/10/18 174 lb (78.9 kg)  04/07/18 165 lb (74.8 kg)    Physical Exam Constitutional:      General: He is not in acute distress.    Appearance: He is well-developed.     Comments: NAD  Eyes:     Conjunctiva/sclera: Conjunctivae normal.     Pupils: Pupils are equal, round, and reactive to light.  Neck:     Musculoskeletal: Normal range of motion.     Thyroid: No thyromegaly.  Vascular: No JVD.  Cardiovascular:     Rate and Rhythm: Normal rate and regular rhythm.     Heart sounds: Normal heart sounds. No murmur. No friction rub. No gallop.   Pulmonary:     Effort: Pulmonary effort is normal. No respiratory distress.     Breath sounds: Normal breath sounds. No wheezing or rales.  Chest:     Chest wall: No tenderness.  Abdominal:     General: Bowel sounds are normal. There is no distension.     Palpations: Abdomen is soft. There is no mass.     Tenderness: There is no abdominal tenderness. There is no guarding or rebound.  Musculoskeletal: Normal range of motion.        General: No tenderness.   Lymphadenopathy:     Cervical: No cervical adenopathy.  Skin:    General: Skin is warm and dry.     Findings: No rash.  Neurological:     Mental Status: He is alert and oriented to person, place, and time.     Cranial Nerves: No cranial nerve deficit.     Motor: No abnormal muscle tone.     Coordination: Coordination normal.     Gait: Gait normal.     Deep Tendon Reflexes: Reflexes are normal and symmetric.  Psychiatric:        Behavior: Behavior normal.        Thought Content: Thought content normal.        Judgment: Judgment normal.   Thin  Lab Results  Component Value Date   WBC 4.4 08/10/2018   HGB 16.1 08/10/2018   HCT 46.8 08/10/2018   PLT 179.0 08/10/2018   GLUCOSE 101 (H) 08/10/2018   CHOL 204 (H) 12/03/2017   TRIG 35 12/03/2017   HDL 71 12/03/2017   LDLDIRECT 116.5 01/02/2011   LDLCALC 126 (H) 12/03/2017   ALT 9 08/10/2018   AST 20 08/10/2018   NA 139 08/10/2018   K 4.3 08/10/2018   CL 106 08/10/2018   CREATININE 0.92 08/10/2018   BUN 11 08/10/2018   CO2 26 08/10/2018   TSH 1.78 08/10/2018   PSA 5.20 (H) 05/08/2014   HGBA1C 4.7 (L) 12/03/2017    No results found.  Assessment & Plan:   There are no diagnoses linked to this encounter.   No orders of the defined types were placed in this encounter.    Follow-up: No follow-ups on file.  Walker Kehr, MD

## 2019-01-11 LAB — BASIC METABOLIC PANEL
BUN: 15 mg/dL (ref 6–23)
CO2: 22 mEq/L (ref 19–32)
Calcium: 9.9 mg/dL (ref 8.4–10.5)
Chloride: 106 mEq/L (ref 96–112)
Creatinine, Ser: 0.99 mg/dL (ref 0.40–1.50)
GFR: 76.66 mL/min (ref >60.00)
Glucose, Bld: 101 mg/dL — ABNORMAL HIGH (ref 70–99)
Potassium: 4.1 mEq/L (ref 3.5–5.1)
Sodium: 139 mEq/L (ref 135–145)

## 2019-01-11 LAB — HEPATIC FUNCTION PANEL
ALT: 7 U/L (ref 0–53)
AST: 18 U/L (ref 0–37)
Albumin: 4.7 g/dL (ref 3.5–5.2)
Alkaline Phosphatase: 61 U/L (ref 39–117)
Bilirubin, Direct: 0.1 mg/dL (ref 0.0–0.3)
Total Bilirubin: 0.6 mg/dL (ref 0.2–1.2)
Total Protein: 6.7 g/dL (ref 6.0–8.3)

## 2019-01-11 LAB — LITHIUM LEVEL: Lithium Lvl: 0.8 mmol/L (ref 0.6–1.2)

## 2019-01-24 DIAGNOSIS — F3174 Bipolar disorder, in full remission, most recent episode manic: Secondary | ICD-10-CM | POA: Diagnosis not present

## 2019-03-03 DIAGNOSIS — K148 Other diseases of tongue: Secondary | ICD-10-CM | POA: Diagnosis not present

## 2019-03-03 DIAGNOSIS — L821 Other seborrheic keratosis: Secondary | ICD-10-CM | POA: Diagnosis not present

## 2019-04-01 ENCOUNTER — Other Ambulatory Visit: Payer: Self-pay | Admitting: Internal Medicine

## 2019-04-07 MED ORDER — LEVOTHYROXINE SODIUM 50 MCG PO TABS: 75.0000 ug | ORAL_TABLET | Freq: Every day | ORAL | 3 refills | Status: DC

## 2019-04-26 DIAGNOSIS — F3174 Bipolar disorder, in full remission, most recent episode manic: Secondary | ICD-10-CM | POA: Diagnosis not present

## 2019-04-27 ENCOUNTER — Other Ambulatory Visit: Payer: Self-pay

## 2019-04-27 DIAGNOSIS — Z20828 Contact with and (suspected) exposure to other viral communicable diseases: Secondary | ICD-10-CM | POA: Diagnosis not present

## 2019-04-27 DIAGNOSIS — Z20822 Contact with and (suspected) exposure to covid-19: Secondary | ICD-10-CM

## 2019-04-28 LAB — NOVEL CORONAVIRUS, NAA: SARS-CoV-2, NAA: NOT DETECTED

## 2019-05-16 ENCOUNTER — Ambulatory Visit (INDEPENDENT_AMBULATORY_CARE_PROVIDER_SITE_OTHER): Payer: Federal, State, Local not specified - PPO | Admitting: Internal Medicine

## 2019-05-16 ENCOUNTER — Other Ambulatory Visit: Payer: Self-pay

## 2019-05-16 ENCOUNTER — Other Ambulatory Visit (INDEPENDENT_AMBULATORY_CARE_PROVIDER_SITE_OTHER): Payer: Federal, State, Local not specified - PPO

## 2019-05-16 ENCOUNTER — Encounter: Payer: Self-pay | Admitting: Internal Medicine

## 2019-05-16 VITALS — BP 124/80 | HR 74 | Temp 98.2°F | Ht 72.0 in | Wt 172.0 lb

## 2019-05-16 DIAGNOSIS — K148 Other diseases of tongue: Secondary | ICD-10-CM

## 2019-05-16 DIAGNOSIS — Z23 Encounter for immunization: Secondary | ICD-10-CM

## 2019-05-16 DIAGNOSIS — R634 Abnormal weight loss: Secondary | ICD-10-CM

## 2019-05-16 DIAGNOSIS — E039 Hypothyroidism, unspecified: Secondary | ICD-10-CM | POA: Diagnosis not present

## 2019-05-16 DIAGNOSIS — F312 Bipolar disorder, current episode manic severe with psychotic features: Secondary | ICD-10-CM

## 2019-05-16 LAB — CBC WITH DIFFERENTIAL/PLATELET
Basophils Absolute: 0.1 10*3/uL (ref 0.0–0.1)
Basophils Relative: 2 % (ref 0.0–3.0)
Eosinophils Absolute: 0.2 10*3/uL (ref 0.0–0.7)
Eosinophils Relative: 5.4 % — ABNORMAL HIGH (ref 0.0–5.0)
HCT: 44.8 % (ref 39.0–52.0)
Hemoglobin: 15.4 g/dL (ref 13.0–17.0)
Lymphocytes Relative: 21.4 % (ref 12.0–46.0)
Lymphs Abs: 0.9 10*3/uL (ref 0.7–4.0)
MCHC: 34.3 g/dL (ref 30.0–36.0)
MCV: 95 fl (ref 78.0–100.0)
Monocytes Absolute: 0.3 10*3/uL (ref 0.1–1.0)
Monocytes Relative: 7 % (ref 3.0–12.0)
Neutro Abs: 2.8 10*3/uL (ref 1.4–7.7)
Neutrophils Relative %: 64.2 % (ref 43.0–77.0)
Platelets: 181 10*3/uL (ref 150.0–400.0)
RBC: 4.72 Mil/uL (ref 4.22–5.81)
RDW: 12.5 % (ref 11.5–15.5)
WBC: 4.4 10*3/uL (ref 4.0–10.5)

## 2019-05-16 LAB — BASIC METABOLIC PANEL
BUN: 16 mg/dL (ref 6–23)
CO2: 28 mEq/L (ref 19–32)
Calcium: 9.8 mg/dL (ref 8.4–10.5)
Chloride: 107 mEq/L (ref 96–112)
Creatinine, Ser: 0.98 mg/dL (ref 0.40–1.50)
GFR: 77.48 mL/min (ref >60.00)
Glucose, Bld: 101 mg/dL — ABNORMAL HIGH (ref 70–99)
Potassium: 4.2 mEq/L (ref 3.5–5.1)
Sodium: 139 mEq/L (ref 135–145)

## 2019-05-16 LAB — HEPATIC FUNCTION PANEL
ALT: 9 U/L (ref 0–53)
AST: 20 U/L (ref 0–37)
Albumin: 4.7 g/dL (ref 3.5–5.2)
Alkaline Phosphatase: 62 U/L (ref 39–117)
Bilirubin, Direct: 0.1 mg/dL (ref 0.0–0.3)
Total Bilirubin: 0.6 mg/dL (ref 0.2–1.2)
Total Protein: 6.6 g/dL (ref 6.0–8.3)

## 2019-05-16 LAB — AMMONIA: Ammonia: 44 umol/L — ABNORMAL HIGH (ref 11–35)

## 2019-05-16 MED ORDER — MONTELUKAST SODIUM 10 MG PO TABS: 10.0000 mg | ORAL_TABLET | Freq: Every day | ORAL | 3 refills | Status: DC

## 2019-05-16 NOTE — Assessment & Plan Note (Addendum)
[facility]

## 2019-05-16 NOTE — Assessment & Plan Note (Signed)
On Levothroid 

## 2019-05-16 NOTE — Patient Instructions (Signed)
Case Report Surgical Pathology                Case: [id number]                 Authorizing Provider: Guido Sander, MD   Collected:      03/03/2019 1055       Ordering Location:   [facility]    Received:      03/03/2019 1643       Pathologist:      Sharman Cheek, Andee Poles,                                       MD                                      Specimen:  Tongue, Biopsy, right lateral tongue                            DIAGNOSIS A. 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   Reviewed by resident/fellow Jennings Books, CAROLINE   Clinical Information Leukoplakia.  Tongue lesion.   Gross Examination A. "Tongue", received in formalin is a segment of tan soft tissue measuring 0.5 x 0.3 x 0.2 cm. The specimen is submitted entirely in block A1.  W. Issa/Dr. Ralph Leyden   Microscopic Examination Microscopic examination is performed.   Disclaimer All immunohistochemical and in situ hybridization tests performed at [facility] and reported herein were developed, validated and their performance characteristics determined by the, [facility]. During the performance of these tests, appropriate positive and negative control slides are also performed and reviewed. All control slides and internal controls (when applicable) demonstrate the expected immunoreactive patterns and/or nucleic acid hybridization. Some of the tests may not be cleared or approved by the U.S. Food and Drug Administration (FDA). The FDA has determined that such clearance or approval is not necessary. These tests are used for clinical purposes and should not be regarded as investigational or as research. This laboratory is certified  under the [place] (CLIA) as qualified to perform high complexity clinical testing.   Attestation All of the diagnostic evaluations on the enumerated specimens have been personally conducted by the pathologists involved in the care of this patient as indicated by the electronic signatures above.   Specimen Collected on  Tissue-Pathology - Tongue, Biopsy 03/03/2019 10:55 AM

## 2019-05-16 NOTE — Assessment & Plan Note (Addendum)
S/p bx, sx's resolved Case Report Surgical Pathology                Case: [id number]                 Authorizing Provider: Guido Sander, MD   Collected:      03/03/2019 1055       Ordering Location:   [facility]    Received:      03/03/2019 1643       Pathologist:      Sharman Cheek, Andee Poles,                                       MD                                      Specimen:  Tongue, Biopsy, right lateral tongue                            DIAGNOSIS A. 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   Reviewed by resident/fellow Jennings Books, CAROLINE   Clinical Information Leukoplakia.  Tongue lesion.   Gross Examination A. "Tongue", received in formalin is a segment of tan soft tissue measuring 0.5 x 0.3 x 0.2 cm. The specimen is submitted entirely in block A1.  W. Issa/Dr. Ralph Leyden   Microscopic Examination Microscopic examination is performed.   Disclaimer All immunohistochemical and in situ hybridization tests performed at [facility] and reported herein were developed, validated and their performance characteristics determined by the, [facility]. During the performance of these tests, appropriate positive and negative control slides are also performed and reviewed. All control slides and internal controls (when applicable) demonstrate the expected immunoreactive patterns and/or nucleic acid hybridization. Some of the tests may not be cleared or approved by the U.S. Food and Drug Administration (FDA). The FDA has determined that such clearance or approval is not necessary. These tests are used for clinical purposes and should not be regarded as investigational or as research. This  laboratory is certified under the [place] (CLIA) as qualified to perform high complexity clinical testing.   Attestation All of the diagnostic evaluations on the enumerated specimens have been personally conducted by the pathologists involved in the care of this patient as indicated by the electronic signatures above.   Specimen Collected on  Tissue-Pathology - Tongue, Biopsy 03/03/2019 10:55 AM

## 2019-05-16 NOTE — Progress Notes (Signed)
Subjective:  Patient ID: Adam Keller, male    DOB: [birth year]  Age: 63 y.o. MRN: [id number]  CC: No chief complaint on file.   HPI Adam Keller presents for allergies, hypothyroidism, GERD f/u On Omeprazole prn   Outpatient Medications Prior to Visit  Medication Sig Dispense Refill  . Cholecalciferol 1000 UNITS tablet Take 1,000 Units by mouth daily.      . colesevelam (WELCHOL) 625 MG tablet Take 625-1,250 mg by mouth as needed. For diarrhea     . dutasteride (AVODART) 0.5 MG capsule Take 1 capsule (0.5 mg total) by mouth every other day. 15 capsule 1  . fish oil-omega-3 fatty acids 1000 MG capsule Take 1 g by mouth daily.      . hydrocortisone cream 1 % Apply topically 2 (two) times daily. 30 g 0  . lamoTRIgine (LAMICTAL) 200 MG tablet Take 1 tablet (200 mg total) by mouth at bedtime. 30 tablet 1  . levothyroxine (SYNTHROID) 50 MCG tablet Take 1.5 tablets (75 mcg total) by mouth daily before breakfast. 135 tablet 3  . lithium carbonate 600 MG capsule Take 1 capsule (600 mg total) by mouth 2 (two) times daily with a meal. 60 capsule 1  . mometasone (NASONEX) 50 MCG/ACT nasal spray SHAKE LIQUID AND USE 2 SPRAYS IN EACH NOSTRIL DAILY 51 g 3  . Multiple Vitamins-Minerals (MULTIVITAMIN,TX-MINERALS) tablet Take 1 tablet by mouth daily.      Marland Kitchen OLANZapine (ZYPREXA) 15 MG tablet Take by mouth.    Marland Kitchen omeprazole (PRILOSEC) 40 MG capsule Take 1 capsule (40 mg total) by mouth daily as needed. 30 capsule 2  . montelukast (SINGULAIR) 10 MG tablet Take 1 tablet (10 mg total) by mouth daily. Yearly physical due in May must see MD for future refills 90 tablet 3   No facility-administered medications prior to visit.     ROS: Review of Systems  Constitutional: Negative for appetite change, fatigue and unexpected weight change.  HENT: Negative for congestion, nosebleeds, sneezing, sore throat and trouble swallowing.   Eyes: Negative for itching and visual disturbance.  Respiratory: Negative for  cough.   Cardiovascular: Negative for chest pain, palpitations and leg swelling.  Gastrointestinal: Negative for abdominal distention, blood in stool, diarrhea and nausea.  Genitourinary: Negative for frequency and hematuria.  Musculoskeletal: Negative for back pain, gait problem, joint swelling and neck pain.  Skin: Negative for rash.  Neurological: Negative for dizziness, tremors, speech difficulty and weakness.  Psychiatric/Behavioral: Negative for agitation, dysphoric mood, sleep disturbance and suicidal ideas. The patient is not nervous/anxious.     Objective:  BP 124/80 (BP Location: Left Arm, Patient Position: Sitting, Cuff Size: Normal)   Pulse 74   Temp 98.2 F (36.8 C) (Oral)   Ht 6' (1.829 m)   Wt 172 lb (78 kg)   SpO2 97%   BMI 23.33 kg/m   BP Readings from Last 3 Encounters:  05/16/19 124/80  01/10/19 124/80  08/10/18 124/76    Wt Readings from Last 3 Encounters:  05/16/19 172 lb (78 kg)  01/10/19 166 lb (75.3 kg)  08/10/18 174 lb (78.9 kg)    Physical Exam Constitutional:      General: He is not in acute distress.    Appearance: He is well-developed.     Comments: NAD  Eyes:     Conjunctiva/sclera: Conjunctivae normal.     Pupils: Pupils are equal, round, and reactive to light.  Neck:     Musculoskeletal: Normal range of motion.  Thyroid: No thyromegaly.     Vascular: No JVD.  Cardiovascular:     Rate and Rhythm: Normal rate and regular rhythm.     Heart sounds: Normal heart sounds. No murmur. No friction rub. No gallop.   Pulmonary:     Effort: Pulmonary effort is normal. No respiratory distress.     Breath sounds: Normal breath sounds. No wheezing or rales.  Chest:     Chest wall: No tenderness.  Abdominal:     General: Bowel sounds are normal. There is no distension.     Palpations: Abdomen is soft. There is no mass.     Tenderness: There is no abdominal tenderness. There is no guarding or rebound.  Musculoskeletal: Normal range of motion.         General: No tenderness.  Lymphadenopathy:     Cervical: No cervical adenopathy.  Skin:    General: Skin is warm and dry.     Findings: No rash.  Neurological:     Mental Status: He is alert and oriented to person, place, and time.     Cranial Nerves: No cranial nerve deficit.     Motor: No abnormal muscle tone.     Coordination: Coordination normal.     Gait: Gait normal.     Deep Tendon Reflexes: Reflexes are normal and symmetric.  Psychiatric:        Behavior: Behavior normal.        Thought Content: Thought content normal.        Judgment: Judgment normal.    R tongue defect   Case Report Surgical Pathology                Case: [id number]                 Authorizing Provider: Guido Sander, MD   Collected:      03/03/2019 1055       Ordering Location:   [facility]    Received:      03/03/2019 1643       Pathologist:      Sharman Cheek, Andee Poles,                                       MD                                      Specimen:  Tongue, Biopsy, right lateral tongue                            DIAGNOSIS A. 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   Reviewed by resident/fellow Jennings Books, CAROLINE   Clinical Information Leukoplakia.  Tongue lesion.   Gross Examination A. "Tongue", received in formalin is a segment of tan soft tissue measuring 0.5 x 0.3 x 0.2 cm. The specimen is submitted entirely in block A1.  W. Issa/Dr. Ralph Leyden   Microscopic Examination Microscopic examination is performed.   Disclaimer All immunohistochemical and in situ hybridization tests performed at [facility] and reported herein were developed, validated and their performance characteristics determined by the, [facility]. During the performance of these tests, appropriate positive and negative control slides are also performed and  reviewed. All control slides and internal controls (when applicable) demonstrate the expected immunoreactive patterns and/or nucleic acid hybridization. Some of the tests may not be cleared or approved by the U.S. Food and Drug Administration (FDA). The FDA has determined that such clearance or approval is not necessary. These tests are used for clinical purposes and should not be regarded as investigational or as research. This laboratory is certified under the [place] (CLIA) as qualified to perform high complexity clinical testing.   Attestation All of the diagnostic evaluations on the enumerated specimens have been personally conducted by the pathologists involved in the care of this patient as indicated by the electronic signatures above.   Specimen Collected on  Tissue-Pathology - Tongue, Biopsy 03/03/2019 10:55 AM     Lab Results  Component Value Date   WBC 4.6 01/10/2019   HGB 15.7 01/10/2019   HCT 44.8 01/10/2019   PLT 205.0 01/10/2019   GLUCOSE 101 (H) 01/10/2019   CHOL 204 (H) 12/03/2017   TRIG 35 12/03/2017   HDL 71 12/03/2017   LDLDIRECT 116.5 01/02/2011   LDLCALC 126 (H) 12/03/2017   ALT 7 01/10/2019   AST 18 01/10/2019   NA 139 01/10/2019   K 4.1 01/10/2019   CL 106 01/10/2019   CREATININE 0.99 01/10/2019   BUN 15 01/10/2019   CO2 22 01/10/2019   TSH 0.81 01/10/2019   PSA 5.20 (H) 05/08/2014   HGBA1C 4.7 (L) 12/03/2017    No results found.  Assessment & Plan:   Diagnoses and all orders for this visit:  Need for influenza vaccination -     Flu Vaccine QUAD 6+ mos PF IM (Fluarix Quad PF)  Other orders -     montelukast (SINGULAIR) 10 MG tablet; Take 1 tablet (10 mg total) by mouth daily. Yearly physical due in May must see MD for future refills     Meds  ordered this encounter  Medications  . montelukast (SINGULAIR) 10 MG tablet    Sig: Take 1 tablet (10 mg total) by mouth daily. Yearly physical due in May must see MD for future refills    Dispense:  90 tablet    Refill:  3     Follow-up: No follow-ups on file.  Walker Kehr, MD

## 2019-05-16 NOTE — Assessment & Plan Note (Signed)
Reversed

## 2019-05-17 ENCOUNTER — Encounter: Payer: Self-pay | Admitting: Internal Medicine

## 2019-05-17 DIAGNOSIS — R7989 Other specified abnormal findings of blood chemistry: Secondary | ICD-10-CM | POA: Insufficient documentation

## 2019-05-17 LAB — LITHIUM LEVEL: Lithium Lvl: 0.7 mmol/L (ref 0.6–1.2)

## 2019-05-23 DIAGNOSIS — R972 Elevated prostate specific antigen [PSA]: Secondary | ICD-10-CM | POA: Diagnosis not present

## 2019-06-02 ENCOUNTER — Other Ambulatory Visit: Payer: Self-pay | Admitting: Urology

## 2019-06-02 DIAGNOSIS — R972 Elevated prostate specific antigen [PSA]: Secondary | ICD-10-CM

## 2019-06-08 DIAGNOSIS — N400 Enlarged prostate without lower urinary tract symptoms: Secondary | ICD-10-CM | POA: Diagnosis not present

## 2019-06-29 ENCOUNTER — Ambulatory Visit
Admission: RE | Admit: 2019-06-29 | Discharge: 2019-06-29 | Disposition: A | Discharge status: Home / Self Care | Payer: Federal, State, Local not specified - PPO | Source: Ambulatory Visit | Attending: Urology | Admitting: Urology

## 2019-06-29 DIAGNOSIS — N3289 Other specified disorders of bladder: Secondary | ICD-10-CM | POA: Diagnosis not present

## 2019-06-29 DIAGNOSIS — N400 Enlarged prostate without lower urinary tract symptoms: Secondary | ICD-10-CM | POA: Diagnosis not present

## 2019-06-29 DIAGNOSIS — R972 Elevated prostate specific antigen [PSA]: Secondary | ICD-10-CM

## 2019-06-29 DIAGNOSIS — N4289 Other specified disorders of prostate: Secondary | ICD-10-CM | POA: Diagnosis not present

## 2019-06-29 DIAGNOSIS — K4090 Unilateral inguinal hernia, without obstruction or gangrene, not specified as recurrent: Secondary | ICD-10-CM | POA: Diagnosis not present

## 2019-06-29 MED ORDER — GADOBENATE DIMEGLUMINE 529 MG/ML IV SOLN
15.0000 mL | Freq: Once | INTRAVENOUS | Status: AC | PRN
  Administered 2019-06-29: 15 mL via INTRAVENOUS

## 2019-07-18 DIAGNOSIS — K148 Other diseases of tongue: Secondary | ICD-10-CM | POA: Diagnosis not present

## 2019-07-25 DIAGNOSIS — F3174 Bipolar disorder, in full remission, most recent episode manic: Secondary | ICD-10-CM | POA: Diagnosis not present

## 2019-07-25 DIAGNOSIS — F5101 Primary insomnia: Secondary | ICD-10-CM | POA: Diagnosis not present

## 2019-08-12 DIAGNOSIS — K148 Other diseases of tongue: Secondary | ICD-10-CM | POA: Diagnosis not present

## 2019-08-12 DIAGNOSIS — C029 Malignant neoplasm of tongue, unspecified: Secondary | ICD-10-CM | POA: Diagnosis not present

## 2019-08-22 DIAGNOSIS — C61 Malignant neoplasm of prostate: Secondary | ICD-10-CM | POA: Diagnosis not present

## 2019-08-22 DIAGNOSIS — R972 Elevated prostate specific antigen [PSA]: Secondary | ICD-10-CM | POA: Diagnosis not present

## 2019-08-24 DIAGNOSIS — C029 Malignant neoplasm of tongue, unspecified: Secondary | ICD-10-CM | POA: Diagnosis not present

## 2019-08-31 DIAGNOSIS — C61 Malignant neoplasm of prostate: Secondary | ICD-10-CM | POA: Diagnosis not present

## 2019-09-05 DIAGNOSIS — C029 Malignant neoplasm of tongue, unspecified: Secondary | ICD-10-CM | POA: Diagnosis not present

## 2019-09-05 DIAGNOSIS — Z20822 Contact with and (suspected) exposure to covid-19: Secondary | ICD-10-CM | POA: Diagnosis not present

## 2019-09-06 DIAGNOSIS — C029 Malignant neoplasm of tongue, unspecified: Secondary | ICD-10-CM | POA: Diagnosis not present

## 2019-09-06 DIAGNOSIS — Z87891 Personal history of nicotine dependence: Secondary | ICD-10-CM | POA: Diagnosis not present

## 2019-09-06 DIAGNOSIS — Z79899 Other long term (current) drug therapy: Secondary | ICD-10-CM | POA: Diagnosis not present

## 2019-09-06 DIAGNOSIS — C61 Malignant neoplasm of prostate: Secondary | ICD-10-CM | POA: Diagnosis not present

## 2019-09-06 DIAGNOSIS — C021 Malignant neoplasm of border of tongue: Secondary | ICD-10-CM | POA: Diagnosis not present

## 2019-09-06 DIAGNOSIS — K1321 Leukoplakia of oral mucosa, including tongue: Secondary | ICD-10-CM | POA: Diagnosis not present

## 2019-09-12 DIAGNOSIS — K1379 Other lesions of oral mucosa: Secondary | ICD-10-CM | POA: Diagnosis not present

## 2019-09-12 DIAGNOSIS — Z87891 Personal history of nicotine dependence: Secondary | ICD-10-CM | POA: Diagnosis not present

## 2019-09-12 DIAGNOSIS — Z91048 Other nonmedicinal substance allergy status: Secondary | ICD-10-CM | POA: Diagnosis not present

## 2019-09-12 DIAGNOSIS — E079 Disorder of thyroid, unspecified: Secondary | ICD-10-CM | POA: Diagnosis not present

## 2019-09-12 DIAGNOSIS — C029 Malignant neoplasm of tongue, unspecified: Secondary | ICD-10-CM | POA: Diagnosis not present

## 2019-09-12 DIAGNOSIS — Z79890 Hormone replacement therapy (postmenopausal): Secondary | ICD-10-CM | POA: Diagnosis not present

## 2019-09-12 DIAGNOSIS — Z5181 Encounter for therapeutic drug level monitoring: Secondary | ICD-10-CM | POA: Diagnosis not present

## 2019-09-12 DIAGNOSIS — K91841 Postprocedural hemorrhage and hematoma of a digestive system organ or structure following other procedure: Secondary | ICD-10-CM | POA: Diagnosis not present

## 2019-09-12 DIAGNOSIS — R5383 Other fatigue: Secondary | ICD-10-CM | POA: Diagnosis not present

## 2019-09-12 DIAGNOSIS — Z6740 Type O blood, Rh positive: Secondary | ICD-10-CM | POA: Diagnosis not present

## 2019-09-12 DIAGNOSIS — Y838 Other surgical procedures as the cause of abnormal reaction of the patient, or of later complication, without mention of misadventure at the time of the procedure: Secondary | ICD-10-CM | POA: Diagnosis not present

## 2019-09-12 DIAGNOSIS — Z85810 Personal history of malignant neoplasm of tongue: Secondary | ICD-10-CM | POA: Diagnosis not present

## 2019-09-12 DIAGNOSIS — Z20822 Contact with and (suspected) exposure to covid-19: Secondary | ICD-10-CM | POA: Diagnosis not present

## 2019-09-12 DIAGNOSIS — Z7951 Long term (current) use of inhaled steroids: Secondary | ICD-10-CM | POA: Diagnosis not present

## 2019-09-12 DIAGNOSIS — Z79899 Other long term (current) drug therapy: Secondary | ICD-10-CM | POA: Diagnosis not present

## 2019-09-13 ENCOUNTER — Ambulatory Visit: Payer: Federal, State, Local not specified - PPO | Admitting: Internal Medicine

## 2019-09-13 MED ORDER — ACETAMINOPHEN 160 MG/5ML PO SUSP
650.00 | ORAL | Status: DC
Start: ? — End: 2019-09-13

## 2019-09-13 MED ORDER — ONDANSETRON HCL 4 MG/2ML IJ SOLN
4.00 | INTRAMUSCULAR | Status: DC
Start: ? — End: 2019-09-13

## 2019-09-13 MED ORDER — OXYCODONE HCL 5 MG/5ML PO SOLN
5.00 | ORAL | Status: DC
Start: ? — End: 2019-09-13

## 2019-09-13 MED ORDER — LIDOCAINE HCL 1 % IJ SOLN
0.50 | INTRAMUSCULAR | Status: DC
Start: ? — End: 2019-09-13

## 2019-09-16 DIAGNOSIS — C029 Malignant neoplasm of tongue, unspecified: Secondary | ICD-10-CM | POA: Diagnosis not present

## 2019-09-20 ENCOUNTER — Encounter: Payer: Self-pay | Admitting: Internal Medicine

## 2019-09-20 ENCOUNTER — Other Ambulatory Visit: Payer: Self-pay

## 2019-09-20 ENCOUNTER — Ambulatory Visit: Payer: Federal, State, Local not specified - PPO | Admitting: Internal Medicine

## 2019-09-20 VITALS — BP 128/78 | HR 82 | Temp 98.5°F | Ht 72.0 in | Wt 161.0 lb

## 2019-09-20 DIAGNOSIS — C61 Malignant neoplasm of prostate: Secondary | ICD-10-CM | POA: Diagnosis not present

## 2019-09-20 DIAGNOSIS — E039 Hypothyroidism, unspecified: Secondary | ICD-10-CM

## 2019-09-20 DIAGNOSIS — C029 Malignant neoplasm of tongue, unspecified: Secondary | ICD-10-CM

## 2019-09-20 LAB — TSH: TSH: 0.86 u[IU]/mL (ref 0.35–4.50)

## 2019-09-20 LAB — T4, FREE: Free T4: 0.95 ng/dL (ref 0.60–1.60)

## 2019-09-20 NOTE — Progress Notes (Signed)
Subjective:  Patient ID: Adam Keller, male    DOB: [birth year]  Age: 64 y.o. MRN: [id number]  CC: No chief complaint on file.   HPI Frisco Staiger Ariola presents for right chronic lateral tongue lesion, biopsied several times with benign pathology, now with recent increase in size, tenderness, ulceration. 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   He also was diagnosed with prostate cancer -Dr Alinda Money   Outpatient Medications Prior to Visit  Medication Sig Dispense Refill  . acetaminophen (PAIN RELIEF) 325 MG tablet     . Cholecalciferol 1000 UNITS tablet Take 1,000 Units by mouth daily.      . colesevelam (WELCHOL) 625 MG tablet Take 625-1,250 mg by mouth as needed. For diarrhea     . dutasteride (AVODART) 0.5 MG capsule Take 1 capsule (0.5 mg total) by mouth every other day. 15 capsule 1  . fish oil-omega-3 fatty acids 1000 MG capsule Take 1 g by mouth daily.      . hydrocortisone cream 1 % Apply topically 2 (two) times daily. 30 g 0  . lamoTRIgine (LAMICTAL) 200 MG tablet Take 1 tablet (200 mg total) by mouth at bedtime. 30 tablet 1  . levothyroxine (SYNTHROID) 50 MCG tablet Take 1.5 tablets (75 mcg total) by mouth daily before breakfast. 135 tablet 3  . lithium carbonate 600 MG capsule Take 1 capsule (600 mg total) by mouth 2 (two) times daily with a meal. 60 capsule 1  . mometasone (NASONEX) 50 MCG/ACT nasal spray SHAKE LIQUID AND USE 2 SPRAYS IN EACH NOSTRIL DAILY 51 g 3  . montelukast (SINGULAIR) 10 MG tablet Take 1 tablet (10 mg total) by mouth daily. 90 tablet 3  . Multiple Vitamins-Minerals (MULTIVITAMIN,TX-MINERALS) tablet Take 1 tablet by mouth daily.      Marland Kitchen OLANZapine (ZYPREXA) 15 MG tablet Take by mouth.    Marland Kitchen omeprazole (PRILOSEC) 40 MG capsule Take 1 capsule (40 mg total) by mouth daily as needed. 30 capsule 2   No facility-administered medications prior to visit.    ROS: Review of  Systems  Objective:  BP 128/78 (BP Location: Left Arm, Patient Position: Sitting, Cuff Size: Normal)   Pulse 82   Temp 98.5 F (36.9 C) (Oral)   Ht 6' (1.829 m)   Wt 161 lb (73 kg)   SpO2 97%   BMI 21.84 kg/m   BP Readings from Last 3 Encounters:  09/20/19 128/78  05/16/19 124/80  01/10/19 124/80    Wt Readings from Last 3 Encounters:  09/20/19 161 lb (73 kg)  05/16/19 172 lb (78 kg)  01/10/19 166 lb (75.3 kg)    Physical Exam  Lab Results  Component Value Date   WBC 4.4 05/16/2019   HGB 15.4 05/16/2019   HCT 44.8 05/16/2019   PLT 181.0 05/16/2019   GLUCOSE 101 (H) 05/16/2019   CHOL 204 (H) 12/03/2017   TRIG 35 12/03/2017   HDL 71 12/03/2017   LDLDIRECT 116.5 01/02/2011   LDLCALC 126 (H) 12/03/2017   ALT 9 05/16/2019   AST 20 05/16/2019   NA 139 05/16/2019   K 4.2 05/16/2019   CL 107 05/16/2019   CREATININE 0.98 05/16/2019   BUN 16 05/16/2019   CO2 28 05/16/2019   TSH 0.81 01/10/2019   PSA 5.20 (H) 05/08/2014   HGBA1C 4.7 (L) 12/03/2017    MR PROSTATE W WO CONTRAST  Result Date: 06/29/2019 EXAM: MR PROSTATE WITHOUT AND  WITH CONTRAST TECHNIQUE: Multiplanar multisequence MRI images were obtained of the pelvis centered about the prostate. Pre and post contrast images were obtained. CONTRAST:  29mL MULTIHANCE GADOBENATE DIMEGLUMINE 529 MG/ML IV SOLN COMPARISON:  None. FINDINGS: Prostate: No findings suspicious for high-grade macroscopic prostate cancer within the peripheral zone. Specifically, no suspicious low T2 lesion. No restricted diffusion/low ADC. No early arterial enhancement. PI-RADS 1. Enlargement/nodularity of the central gland, suggesting BPH. 17 x 9 x 12 mm irregular/smudgy low T2 lesion along the left anterior apex of the central gland (series 10/image 17). Associated restricted diffusion/low ADC (series 8/image 16-17), although this is not discriminatory. However, there is also associated early arterial enhancement (series 28/image 16). PI-RADS 4.  Volume: 4.7 x 3.9 x 4.5 cm (calculated volume 46.3 mL) Transcapsular spread: Central gland lesion transgresses the anterior fibromuscular stroma. Seminal vesicle involvement: Absent Neurovascular bundle involvement: Absent Pelvic adenopathy: Absent Bone metastasis: Absent Other findings: Thick-walled bladder although underdistended. Tiny fat containing left inguinal hernia. IMPRESSION: 1.7 cm lesion in the left anterior apex of the central gland, raising concern for central gland tumor. PI-RADS 4. This lesion was marked in [place] for potential UroNAV biopsy. No findings suspicious for high-grade macroscopic prostate cancer within the peripheral zone. Central gland lesion transgresses the anterior fibromuscular stroma. No evidence of seminal vesicle invasion, lymphadenopathy, or metastatic disease. Electronically Signed   By: Julian Hy M.D.   On: 06/29/2019 19:33    Assessment & Plan:   There are no diagnoses linked to this encounter.   No orders of the defined types were placed in this encounter.    Follow-up: No follow-ups on file.  Walker Kehr, MD

## 2019-09-20 NOTE — Assessment & Plan Note (Signed)
F/u w/[facility]

## 2019-09-20 NOTE — Addendum Note (Signed)
Addended by: Cresenciano Lick on: 09/20/2019 11:09 AM   Modules accepted: Orders

## 2019-09-20 NOTE — Assessment & Plan Note (Signed)
Labs

## 2019-09-20 NOTE — Assessment & Plan Note (Signed)
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   He also was diagnosed with prostate cancer

## 2019-10-04 DIAGNOSIS — C61 Malignant neoplasm of prostate: Secondary | ICD-10-CM | POA: Diagnosis not present

## 2019-10-06 ENCOUNTER — Ambulatory Visit: Payer: Federal, State, Local not specified - PPO | Attending: Internal Medicine

## 2019-10-06 DIAGNOSIS — Z23 Encounter for immunization: Secondary | ICD-10-CM

## 2019-10-06 NOTE — Progress Notes (Signed)
   Covid-19 Vaccination Clinic  Name:  Adam Keller    MRN: [id number] DOB: 1956/07/01  10/06/2019  Mr. Gotz was observed post Covid-19 immunization for 15 minutes without incident. He was provided with Vaccine Information Sheet and instruction to access the V-Safe system.   Mr. Sellinger was instructed to call 911 with any severe reactions post vaccine: Marland Kitchen Difficulty breathing  . Swelling of face and throat  . A fast heartbeat  . A bad rash all over body  . Dizziness and weakness   Immunizations Administered    Name Date Dose VIS Date Route   Pfizer COVID-19 Vaccine 10/06/2019  1:01 PM 0.3 mL 06/24/2019 Intramuscular   Manufacturer: [place]   Lot: [id number]   [place]: [id number]

## 2019-10-06 NOTE — Progress Notes (Signed)
   Covid-19 Vaccination Clinic  Name:  Adam Keller    MRN: [id number] DOB: 11/16/1955  10/06/2019  Adam Keller was observed post Covid-19 immunization for 15 minutes without incident. He was provided with Vaccine Information Sheet and instruction to access the V-Safe system.   Adam Keller was instructed to call 911 with any severe reactions post vaccine: Marland Kitchen Difficulty breathing  . Swelling of face and throat  . A fast heartbeat  . A bad rash all over body  . Dizziness and weakness

## 2019-10-08 DIAGNOSIS — Z888 Allergy status to other drugs, medicaments and biological substances status: Secondary | ICD-10-CM | POA: Diagnosis not present

## 2019-10-08 DIAGNOSIS — Z87891 Personal history of nicotine dependence: Secondary | ICD-10-CM | POA: Diagnosis not present

## 2019-10-08 DIAGNOSIS — C021 Malignant neoplasm of border of tongue: Secondary | ICD-10-CM | POA: Diagnosis not present

## 2019-10-08 DIAGNOSIS — C029 Malignant neoplasm of tongue, unspecified: Secondary | ICD-10-CM | POA: Diagnosis not present

## 2019-10-08 DIAGNOSIS — Z7951 Long term (current) use of inhaled steroids: Secondary | ICD-10-CM | POA: Diagnosis not present

## 2019-10-08 DIAGNOSIS — Z79899 Other long term (current) drug therapy: Secondary | ICD-10-CM | POA: Diagnosis not present

## 2019-10-08 DIAGNOSIS — Z881 Allergy status to other antibiotic agents status: Secondary | ICD-10-CM | POA: Diagnosis not present

## 2019-10-08 DIAGNOSIS — Z20822 Contact with and (suspected) exposure to covid-19: Secondary | ICD-10-CM | POA: Diagnosis not present

## 2019-10-08 DIAGNOSIS — Z885 Allergy status to narcotic agent status: Secondary | ICD-10-CM | POA: Diagnosis not present

## 2019-10-11 DIAGNOSIS — C021 Malignant neoplasm of border of tongue: Secondary | ICD-10-CM | POA: Diagnosis not present

## 2019-10-11 DIAGNOSIS — Z87891 Personal history of nicotine dependence: Secondary | ICD-10-CM | POA: Diagnosis not present

## 2019-10-13 ENCOUNTER — Other Ambulatory Visit: Payer: Self-pay | Admitting: Urology

## 2019-10-14 MED ORDER — LEVOTHYROXINE SODIUM 100 MCG PO TABS
50.00 | ORAL_TABLET | ORAL | Status: DC
Start: 2019-10-15 — End: 2019-10-14

## 2019-10-14 MED ORDER — FLUTICASONE PROPIONATE 50 MCG/ACT NA SUSP
2.00 | NASAL | Status: DC
Start: 2019-10-15 — End: 2019-10-14

## 2019-10-14 MED ORDER — DSS 100 MG PO CAPS
100.00 | ORAL_CAPSULE | ORAL | Status: DC
Start: 2019-10-14 — End: 2019-10-14

## 2019-10-14 MED ORDER — PANTOPRAZOLE SODIUM 40 MG PO TBEC
40.00 | DELAYED_RELEASE_TABLET | ORAL | Status: DC
Start: 2019-10-15 — End: 2019-10-14

## 2019-10-14 MED ORDER — DUTASTERIDE 0.5 MG PO CAPS
0.50 | ORAL_CAPSULE | ORAL | Status: DC
Start: 2019-10-14 — End: 2019-10-14

## 2019-10-14 MED ORDER — LAMOTRIGINE 100 MG PO TABS
200.00 | ORAL_TABLET | ORAL | Status: DC
Start: 2019-10-14 — End: 2019-10-14

## 2019-10-14 MED ORDER — ACETAMINOPHEN 325 MG PO TABS
650.00 | ORAL_TABLET | ORAL | Status: DC
Start: ? — End: 2019-10-14

## 2019-10-14 MED ORDER — BACITRACIN 500 UNIT/GM EX OINT
TOPICAL_OINTMENT | CUTANEOUS | Status: DC
Start: 2019-10-14 — End: 2019-10-14

## 2019-10-14 MED ORDER — METOCLOPRAMIDE HCL 5 MG/ML IJ SOLN
5.00 | INTRAMUSCULAR | Status: DC
Start: ? — End: 2019-10-14

## 2019-10-14 MED ORDER — LIDOCAINE HCL 1 % IJ SOLN
0.50 | INTRAMUSCULAR | Status: DC
Start: ? — End: 2019-10-14

## 2019-10-14 MED ORDER — MONTELUKAST SODIUM 10 MG PO TABS
10.00 | ORAL_TABLET | ORAL | Status: DC
Start: 2019-10-14 — End: 2019-10-14

## 2019-10-14 MED ORDER — FIRST-MOUTHWASH BLM MT SUSP
5.00 | OROMUCOSAL | Status: DC
Start: ? — End: 2019-10-14

## 2019-10-14 MED ORDER — OLANZAPINE 7.5 MG PO TABS
7.50 | ORAL_TABLET | ORAL | Status: DC
Start: 2019-10-14 — End: 2019-10-14

## 2019-10-14 MED ORDER — ONDANSETRON HCL 4 MG/2ML IJ SOLN
4.00 | INTRAMUSCULAR | Status: DC
Start: ? — End: 2019-10-14

## 2019-10-14 MED ORDER — LITHIUM CARBONATE 300 MG PO CAPS
300.00 | ORAL_CAPSULE | ORAL | Status: DC
Start: 2019-10-14 — End: 2019-10-14

## 2019-10-14 MED ORDER — OXYCODONE HCL 5 MG PO TABS
5.00 | ORAL_TABLET | ORAL | Status: DC
Start: ? — End: 2019-10-14

## 2019-10-14 MED ORDER — CHOLECALCIFEROL 25 MCG (1000 UT) PO TABS
1000.00 | ORAL_TABLET | ORAL | Status: DC
Start: 2019-10-15 — End: 2019-10-14

## 2019-10-17 ENCOUNTER — Other Ambulatory Visit: Payer: Self-pay | Admitting: Urology

## 2019-10-19 DIAGNOSIS — C029 Malignant neoplasm of tongue, unspecified: Secondary | ICD-10-CM | POA: Diagnosis not present

## 2019-10-24 DIAGNOSIS — F3174 Bipolar disorder, in full remission, most recent episode manic: Secondary | ICD-10-CM | POA: Diagnosis not present

## 2019-10-24 DIAGNOSIS — F5101 Primary insomnia: Secondary | ICD-10-CM | POA: Diagnosis not present

## 2019-10-27 DIAGNOSIS — C61 Malignant neoplasm of prostate: Secondary | ICD-10-CM | POA: Diagnosis not present

## 2019-10-27 DIAGNOSIS — M6281 Muscle weakness (generalized): Secondary | ICD-10-CM | POA: Diagnosis not present

## 2019-10-27 DIAGNOSIS — M6289 Other specified disorders of muscle: Secondary | ICD-10-CM | POA: Diagnosis not present

## 2019-10-31 ENCOUNTER — Ambulatory Visit: Payer: Self-pay

## 2019-11-01 ENCOUNTER — Ambulatory Visit: Payer: Federal, State, Local not specified - PPO | Attending: Internal Medicine

## 2019-11-01 DIAGNOSIS — Z23 Encounter for immunization: Secondary | ICD-10-CM

## 2019-11-01 NOTE — Progress Notes (Signed)
   Covid-19 Vaccination Clinic  Name:  Adam Keller    MRN: [id number] DOB: 1956-03-02  11/01/2019  [name] was observed post Covid-19 immunization for 15 minutes without incident. He was provided with Vaccine Information Sheet and instruction to access the V-Safe system.   [name] was instructed to call 911 with any severe reactions post vaccine: Marland Kitchen Difficulty breathing  . Swelling of face and throat  . A fast heartbeat  . A bad rash all over body  . Dizziness and weakness   Immunizations Administered    Name Date Dose VIS Date Route   Pfizer COVID-19 Vaccine 11/01/2019 11:20 AM 0.3 mL 09/07/2018 Intramuscular   Manufacturer: [place]   Lot: [id number]   [place]: [id number]

## 2019-11-03 ENCOUNTER — Ambulatory Visit: Payer: Federal, State, Local not specified - PPO

## 2019-11-08 ENCOUNTER — Encounter: Payer: Self-pay | Admitting: Internal Medicine

## 2019-11-08 ENCOUNTER — Telehealth (INDEPENDENT_AMBULATORY_CARE_PROVIDER_SITE_OTHER): Payer: Federal, State, Local not specified - PPO | Admitting: Internal Medicine

## 2019-11-08 DIAGNOSIS — R972 Elevated prostate specific antigen [PSA]: Secondary | ICD-10-CM

## 2019-11-08 DIAGNOSIS — J069 Acute upper respiratory infection, unspecified: Secondary | ICD-10-CM

## 2019-11-08 MED ORDER — PROMETHAZINE-CODEINE 6.25-10 MG/5ML PO SYRP: 5.0000 mL | ORAL_SOLUTION | ORAL | 0 refills | Status: DC | PRN

## 2019-11-08 MED ORDER — SULFAMETHOXAZOLE-TRIMETHOPRIM 800-160 MG PO TABS: 1.0000 | ORAL_TABLET | Freq: Two times a day (BID) | ORAL | 0 refills | Status: DC

## 2019-11-08 NOTE — Progress Notes (Signed)
Virtual Visit via Video Note  I connected with Zandyn Gilbreath Antosh on 11/08/19 at  1:20 PM EDT by a video enabled telemedicine application and verified that I am speaking with the correct person using two identifiers.   I discussed the limitations of evaluation and management by telemedicine and the availability of in person appointments. The patient expressed understanding and agreed to proceed.  History of Present Illness: C/o nasal congestion w/green d/c x 10 d, worse. C/o cough day and night... COVID (-) on 4/16  There has been no  shortness of breath, abdominal pain, diarrhea, constipation, arthralgias, skin rashes.   Observations/Objective: The patient appears to be in no acute distress.  Assessment and Plan:  See my Assessment and Plan.  Follow Up Instructions:    I discussed the assessment and treatment plan with the patient. The patient was provided an opportunity to ask questions and all were answered. The patient agreed with the plan and demonstrated an understanding of the instructions.   The patient was advised to call back or seek an in-person evaluation if the symptoms worsen or if the condition fails to improve as anticipated.  I provided face-to-face time during this encounter. We were at different locations.   Walker Kehr, MD

## 2019-11-08 NOTE — Assessment & Plan Note (Signed)
Acute sinusitis/bronchitis Bactrim DS x 10 d Prom cod syr prn

## 2019-11-08 NOTE — Assessment & Plan Note (Signed)
Prostatectomy - Dr Alinda Money 12/05/19 pending

## 2019-11-09 DIAGNOSIS — N393 Stress incontinence (female) (male): Secondary | ICD-10-CM | POA: Diagnosis not present

## 2019-11-09 DIAGNOSIS — M6281 Muscle weakness (generalized): Secondary | ICD-10-CM | POA: Diagnosis not present

## 2019-11-09 DIAGNOSIS — M62838 Other muscle spasm: Secondary | ICD-10-CM | POA: Diagnosis not present

## 2019-11-18 DIAGNOSIS — C029 Malignant neoplasm of tongue, unspecified: Secondary | ICD-10-CM | POA: Diagnosis not present

## 2019-11-22 DIAGNOSIS — C61 Malignant neoplasm of prostate: Secondary | ICD-10-CM | POA: Diagnosis not present

## 2019-11-23 ENCOUNTER — Encounter (HOSPITAL_COMMUNITY): Payer: Self-pay

## 2019-11-23 NOTE — Patient Instructions (Addendum)
DUE TO COVID-19 ONLY ONE VISITOR ARE ALLOWED TO COME WITH YOU AND STAY IN THE WAITING ROOM ONLY DURING PRE OP AND PROCEDURE. THEN TWO VISITORS MAY VISIT WITH YOU IN YOUR PRIVATE ROOM DURING VISITING HOURS ONLY!!   COVID SWAB TESTING MUST BE COMPLETED ON:  Thursday, Dec 01, 2019  AT 10:30AM at [facility]   (Must self quarantine after testing. Follow instructions on handout.)             Your procedure is scheduled on: Monday, Dec 05, 2019   Report to [facility] Main  Entrance    Report to admitting at 9:15 AM   Call this number if you have problems the morning of surgery [phone]   Magnesium Citrate:  8 Oz AT NOON DAY BEFORE SURGERY   Fleet enema: USE ONE FLEET ENEMA NIGHT BEFORE SURGERY   Do not eat food or drink liquids :After Midnight.   Oral Hygiene is also important to reduce your risk of infection.                                    Remember - BRUSH YOUR TEETH THE MORNING OF SURGERY WITH YOUR REGULAR TOOTHPASTE   Do NOT smoke after Midnight   Take these medicines the morning of surgery with A SIP OF WATER: Levothyroxine, Lithium, Montelukast, Olanzaprine   May use Nasal Spray day of surgery                               You may not have any metal on your body including  jewelry, and body piercings             Do not wear lotions, powders, perfumes/cologne, or deodorant                          Men may shave face and neck.   Do not bring valuables to the hospital. [place].   Contacts, dentures or bridgework may not be worn into surgery.   Bring small overnight bag day of surgery.    Special Instructions: Bring a copy of your healthcare power of attorney and living will documents         the day of surgery if you haven't scanned them in before.              Please read over the following fact sheets you were given: IF YOU HAVE QUESTIONS ABOUT YOUR PRE OP INSTRUCTIONS PLEASE CALL  [phone]   [place] - Preparing for Surgery Before surgery, you can play an important role.  Because skin is not sterile, your skin needs to be as free of germs as possible.  You can reduce the number of germs on your skin by washing with CHG (chlorahexidine gluconate) soap before surgery.  CHG is an antiseptic cleaner which kills germs and bonds with the skin to continue killing germs even after washing. Please DO NOT use if you have an allergy to CHG or antibacterial soaps.  If your skin becomes reddened/irritated stop using the CHG and inform your nurse when you arrive at Short Stay. Do not shave (including legs and underarms) for at least 48 hours prior to the first CHG shower.  You may shave your face/neck.  Please follow these instructions carefully:  1.  Shower with CHG Soap the night before surgery and the  morning of surgery.  2.  If you choose to wash your hair, wash your hair first as usual with your normal  shampoo.  3.  After you shampoo, rinse your hair and body thoroughly to remove the shampoo.                             4.  Use CHG as you would any other liquid soap.  You can apply chg directly to the skin and wash.  Gently with a scrungie or clean washcloth.  5.  Apply the CHG Soap to your body ONLY FROM THE NECK DOWN.   Do   not use on face/ open                           Wound or open sores. Avoid contact with eyes, ears mouth and   genitals (private parts).                       Wash face,  Genitals (private parts) with your normal soap.             6.  Wash thoroughly, paying special attention to the area where your    surgery  will be performed.  7.  Thoroughly rinse your body with warm water from the neck down.  8.  DO NOT shower/wash with your normal soap after using and rinsing off the CHG Soap.                9.  Pat yourself dry with a clean towel.            10.  Wear clean pajamas.            11.  Place clean sheets on your bed the night of your first shower  and do not  sleep with pets. Day of Surgery : Do not apply any lotions/deodorants the morning of surgery.  Please wear clean clothes to the hospital/surgery center.  FAILURE TO FOLLOW THESE INSTRUCTIONS MAY RESULT IN THE CANCELLATION OF YOUR SURGERY  PATIENT SIGNATURE_________________________________  NURSE SIGNATURE__________________________________  ________________________________________________________________________  WHAT IS A BLOOD TRANSFUSION? Blood Transfusion Information  A transfusion is the replacement of blood or some of its parts. Blood is made up of multiple cells which provide different functions.  Red blood cells carry oxygen and are used for blood loss replacement.  White blood cells fight against infection.  Platelets control bleeding.  Plasma helps clot blood.  Other blood products are available for specialized needs, such as hemophilia or other clotting disorders. BEFORE THE TRANSFUSION  Who gives blood for transfusions?   Healthy volunteers who are fully evaluated to make sure their blood is safe. This is blood bank blood. Transfusion therapy is the safest it has ever been in the practice of medicine. Before blood is taken from a donor, a complete history is taken to make sure that person has no history of diseases nor engages in risky social behavior (examples are intravenous drug use or sexual activity with multiple partners). The donor's travel history is screened to minimize risk of transmitting infections, such as malaria. The donated blood is tested for signs of infectious diseases, such as HIV and hepatitis. The blood is then tested to be  sure it is compatible with you in order to minimize the chance of a transfusion reaction. If you or a relative donates blood, this is often done in anticipation of surgery and is not appropriate for emergency situations. It takes many days to process the donated blood. RISKS AND COMPLICATIONS Although transfusion therapy  is very safe and saves many lives, the main dangers of transfusion include:   Getting an infectious disease.  Developing a transfusion reaction. This is an allergic reaction to something in the blood you were given. Every precaution is taken to prevent this. The decision to have a blood transfusion has been considered carefully by your caregiver before blood is given. Blood is not given unless the benefits outweigh the risks. AFTER THE TRANSFUSION  Right after receiving a blood transfusion, you will usually feel much better and more energetic. This is especially true if your red blood cells have gotten low (anemic). The transfusion raises the level of the red blood cells which carry oxygen, and this usually causes an energy increase.  The nurse administering the transfusion will monitor you carefully for complications. HOME CARE INSTRUCTIONS  No special instructions are needed after a transfusion. You may find your energy is better. Speak with your caregiver about any limitations on activity for underlying diseases you may have. SEEK MEDICAL CARE IF:   Your condition is not improving after your transfusion.  You develop redness or irritation at the intravenous (IV) site. SEEK IMMEDIATE MEDICAL CARE IF:  Any of the following symptoms occur over the next 12 hours:  Shaking chills.  You have a temperature by mouth above 102 F (38.9 C), not controlled by medicine.  Chest, back, or muscle pain.  People around you feel you are not acting correctly or are confused.  Shortness of breath or difficulty breathing.  Dizziness and fainting.  You get a rash or develop hives.  You have a decrease in urine output.  Your urine turns a dark color or changes to pink, red, or brown. Any of the following symptoms occur over the next 10 days:  You have a temperature by mouth above 102 F (38.9 C), not controlled by medicine.  Shortness of breath.  Weakness after normal activity.  The white  part of the eye turns yellow (jaundice).  You have a decrease in the amount of urine or are urinating less often.  Your urine turns a dark color or changes to pink, red, or brown. Document Released: 06/27/2000 Document Revised: 09/22/2011 Document Reviewed: 02/14/2008 [facility] Patient Information 2014 [place], [place].  _______________________________________________________________________

## 2019-11-28 ENCOUNTER — Encounter (HOSPITAL_COMMUNITY)
Admission: RE | Admit: 2019-11-28 | Discharge: 2019-11-28 | Disposition: A | Discharge status: Home / Self Care | Payer: Federal, State, Local not specified - PPO | Source: Ambulatory Visit | Attending: Urology | Admitting: Urology

## 2019-11-28 ENCOUNTER — Other Ambulatory Visit: Payer: Self-pay

## 2019-11-28 ENCOUNTER — Encounter (HOSPITAL_COMMUNITY): Payer: Self-pay

## 2019-11-28 DIAGNOSIS — Z01812 Encounter for preprocedural laboratory examination: Secondary | ICD-10-CM | POA: Insufficient documentation

## 2019-11-28 HISTORY — DX: Personal history of other specified conditions: Z87.898

## 2019-11-28 HISTORY — DX: Bipolar disorder, unspecified: F31.9

## 2019-11-28 HISTORY — DX: Malignant neoplasm of tongue, unspecified: C02.9

## 2019-11-28 LAB — BASIC METABOLIC PANEL
Anion gap: 7 (ref 5–15)
BUN: 15 mg/dL (ref 8–23)
CO2: 26 mmol/L (ref 22–32)
Calcium: 9.6 mg/dL (ref 8.9–10.3)
Chloride: 110 mmol/L (ref 98–111)
Creatinine, Ser: 1 mg/dL (ref 0.61–1.24)
GFR calc Af Amer: >60 mL/min (ref >60)
GFR calc non Af Amer: >60 mL/min (ref >60)
Glucose, Bld: 104 mg/dL — ABNORMAL HIGH (ref 70–99)
Potassium: 4.7 mmol/L (ref 3.5–5.1)
Sodium: 143 mmol/L (ref 135–145)

## 2019-11-28 LAB — CBC
HCT: 43 % (ref 39.0–52.0)
Hemoglobin: 14 g/dL (ref 13.0–17.0)
MCH: 31.7 pg (ref 26.0–34.0)
MCHC: 32.6 g/dL (ref 30.0–36.0)
MCV: 97.3 fL (ref 80.0–100.0)
Platelets: 212 10*3/uL (ref 150–400)
RBC: 4.42 MIL/uL (ref 4.22–5.81)
RDW: 13 % (ref 11.5–15.5)
WBC: 3.9 10*3/uL — ABNORMAL LOW (ref 4.0–10.5)
nRBC: 0 % (ref 0.0–0.2)

## 2019-11-28 NOTE — Progress Notes (Signed)
PCP - Dr. Purnell Shoemaker Cardiologist - N/A  Chest x-ray - N/A EKG - greater than 1 year Stress Test - greater than 2 years ECHO - N/A Cardiac Cath - N/A  Sleep Study - N/A CPAP - N/A  Fasting Blood Sugar - N/A Checks Blood Sugar __N/A___ times a day  Blood Thinner Instructions:  N/A Aspirin Instructions: Yes Last Dose: 11/21/19  Anesthesia review: N/A  Patient denies shortness of breath, fever, cough and chest pain at PAT appointment   Patient verbalized understanding of instructions that were given to them at the PAT appointment. Patient was also instructed that they will need to review over the PAT instructions again at home before surgery.

## 2019-11-29 LAB — ABO/RH: ABO/RH(D): O POS

## 2019-12-01 ENCOUNTER — Other Ambulatory Visit
Admission: RE | Admit: 2019-12-01 | Discharge: 2019-12-01 | Disposition: A | Discharge status: Home / Self Care | Payer: Federal, State, Local not specified - PPO | Source: Ambulatory Visit | Attending: Urology | Admitting: Urology

## 2019-12-01 DIAGNOSIS — Z20822 Contact with and (suspected) exposure to covid-19: Secondary | ICD-10-CM | POA: Diagnosis not present

## 2019-12-01 DIAGNOSIS — Z01812 Encounter for preprocedural laboratory examination: Secondary | ICD-10-CM | POA: Diagnosis not present

## 2019-12-02 LAB — SARS CORONAVIRUS 2 (TAT 6-24 HRS): SARS Coronavirus 2: NEGATIVE

## 2019-12-02 NOTE — H&P (Signed)
Office Visit Report     11/22/2019   --------------------------------------------------------------------------------   Adam Keller  MRN: [id number]  DOB: 07/30/1955, 64 year old Male  SSN: [id number]   PRIMARY CARE:  Evie Lacks. Plotnikov, MD  REFERRING:    PROVIDER:  Franchot Gallo, M.D.  TREATING:  Raynelle Bring, M.D.  LOCATION:  [facility] [phone]     --------------------------------------------------------------------------------   CC/HPI: CC: Prostate Cancer    Adam Keller is a 64 year old gentleman who has a long standing history of an elevated PSA dating back to 2008. He underwent an initial prostate biopsy in January 2010 for an elevated PSA of 3.78 that was negative for malignancy but demonstrate HGPIN. He was started on 5 ARI with dutasteride in 2011. His PSA decreased as expected but increased again to 7.84 on 5 ARI therapy with a free % of only 8%. This prompted an MRI of the prostate on 06/29/19 that indicated a 1.7 cm PIRADS 4 lesion at the left anterior apex of the prostate. An MR/US fusion biopsy on 08/22/19 confirmed Gleason 3+4=7 adenocarcinoma with 4 out of 4 targeted biopsies positive and 2 out of 12 systematic biopsies positive for malignancy.   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 Bertell Maria. His surgical margins were negative but he did have a tumor measuring 8 mm. Based on the size of the tumor, it has been recommended that he proceed with a staging lymph node dissection that is scheduled for 10/11/2019. He did undergo this surgery and fortunately all lymph nodes were negative for malignancy. He has recovered uneventfully from that procedure and is currently undergoing observation without the need for further treatment considering his negative lymph node status.   Of note, his wife passed away approximately 3 years ago related to cancer.   Family history: None.    Imaging studies:  MRI (06/29/19): No EPE, SVI, LAD, or bone lesions.   PMH: He has a history of hypothyroidism, hyperlipidemia, and SCCA of the tongue.  PSH: No abdominal surgery.   TNM stage: cT1c N0 Mx  PSA: 7.84  Gleason score: 3+4=7 (GG2)  Biopsy (08/22/19): 6/16 cores positive  Left: L apex (5%, 3+3=6)  Right: R lateral apex (5%, 3+3=6)  MR targets: 4/4 cores - 2 cores (3+4=7, 50%, 40%), 2 cores (3+3=6, 20%, 10%)  Prostate volume: 43.0 cc   Nomogram  OC disease: 61%  EPE: 37%  SVI: 3%  LNI: 3%  PFS (5 year, 10 year): 84%, 77%   Urinary function: IPSS is 3.  Erectile function: SHIM score is 3. He has not been sexually active since his wife's passing 3 years ago.     ALLERGIES: Keflex TABS Toradol IM SOLN    MEDICATIONS: Aspirin 81 MG TABS Oral  Daily Vitamin tablet Oral  Fish Oil CAPS Oral  Lamotrigine 200 mg tablet PRN  Lithium Carbonate 300 mg tablet 1 qd  Singulair 10 mg tablet Oral  Synthroid 50 mcg tablet Oral  Veramyst 27.5 MCG/SPRAY SUSP Nasal     GU PSH: Prostate Needle Biopsy - 08/22/2019     NON-GU PSH: Dental Surgery Procedure - 2009 Surgical Pathology, Gross And Microscopic Examination For Prostate Needle - 08/22/2019     GU PMH: Stress Incontinence - 11/09/2019 Prostate Cancer, Newly diagnosed adenocarcinoma the prostate after several negative biopsies. PSA with correction for 5 alpha reductase inhibitor is 15.68. Kattan nomogram predictions-- OCD 45% Lymph  nodes/seminal vesicle involvement--6/5% respectively Progression free probability after radical prostatectomy at 5/10 years--74/60% respectively - 08/31/2019 Elevated PSA - 08/22/2019, (Worsening), Prostate exam normal. PSA slightly elevated for him w/ %free PSA at 8%. It measured at 7.5 on dutasteride but all previous bx's have been negative. Will move forward with MRI. , - 06/02/2019 (Stable), Stable PSA/DRE, - 05/26/2018, PSA is up a bit compared to baseline, but not a significant change. Continued  benign exam, - 11/25/2017, Relatively stable PSA curve. He is on dutasteride. Benign exam today., - 05/27/2017, - 2017, Elevated prostate specific antigen (PSA), - 2016 BPH w/o LUTS, Benign prostatic hyperplasia with elevated prostate specific antigen (PSA) - 2016      PMH Notes:  2007-04-01 11:10:15 - Note: Bipolar Disorder   NON-GU PMH: Muscle weakness (generalized) - 11/09/2019, - 10/27/2019 Other muscle spasm - 11/09/2019 Other specified disorders of muscle - 10/27/2019 Encounter for general adult medical examination without abnormal findings, Encounter for preventive health examination - 2016 Personal history of other endocrine, nutritional and metabolic disease, History of hypercholesterolemia - 2014, History of hypothyroidism, - 2014 Carcinoma in situ of tongue    FAMILY HISTORY: Acute Myocardial Infarction - Father Death In The Family Father - Father   SOCIAL HISTORY: Marital Status: Married Preferred Language: English; Ethnicity: Not Hispanic Or Latino; Race: White Current Smoking Status: Patient has never smoked.  Has never drank.  Drinks 2 caffeinated drinks per day.    REVIEW OF SYSTEMS:    GU Review Male:   Patient reports get up at night to urinate. Patient denies frequent urination, hard to postpone urination, burning/ pain with urination, leakage of urine, stream starts and stops, trouble starting your streams, and have to strain to urinate .  Gastrointestinal (Upper):   Patient denies nausea and vomiting.  Gastrointestinal (Lower):   Patient denies diarrhea and constipation.  Constitutional:   Patient denies fever, night sweats, weight loss, and fatigue.  Skin:   Patient denies skin rash/ lesion and itching.  Eyes:   Patient denies blurred vision and double vision.  Ears/ Nose/ Throat:   Patient denies sore throat and sinus problems.  Hematologic/Lymphatic:   Patient denies swollen glands and easy bruising.  Cardiovascular:   Patient denies leg swelling and chest pains.   Respiratory:   Patient denies cough and shortness of breath.  Endocrine:   Patient denies excessive thirst.  Musculoskeletal:   Patient denies back pain and joint pain.  Neurological:   Patient denies headaches and dizziness.  Psychologic:   Patient denies depression and anxiety.   VITAL SIGNS:      11/22/2019 09:11 AM  Weight 157 lb / 71.21 kg  Height 72 in / 182.88 cm  BP 111/76 mmHg  Pulse 80 /min  Temperature 97.3 F / 36.2 C  BMI 21.3 kg/m   MULTI-SYSTEM PHYSICAL EXAMINATION:    Constitutional: Well-nourished. No physical deformities. Normally developed. Good grooming.  Respiratory: No labored breathing, no use of accessory muscles. Clear bilaterally.  Cardiovascular: Normal temperature, normal extremity pulses, no swelling, no varicosities. RRR.  Gastrointestinal: No mass, no tenderness, no rigidity, non obese abdomen.      Complexity of Data:  Lab Test Review:   PSA  Records Review:   Pathology Reports, Previous Patient Records  Urine Test Review:   Urinalysis   05/24/19 11/22/18 05/19/18 11/18/17 05/19/17 10/09/16 03/14/16 08/20/15  PSA  Total PSA 7.84 ng/mL 5.99 ng/mL 5.04 ng/mL 6.20 ng/mL 5.21 ng/mL 4.84 ng/dl 5.34  4.45   Free PSA 0.59 ng/mL  0.51 ng/mL        % Free PSA 8 % PSA 9 % PSA          PROCEDURES:          Urinalysis Dipstick Dipstick Cont'd  Color: YELLOW Bilirubin: NEGATIVE  Appearance: CLEAR Ketones: NEGATIVE  Specific Gravity: <=1.005 Blood: NEGATIVE  pH: 6.0 Protein: NEGATIVE  Glucose: NEGATIVE Urobilinogen: 0.2SE.U./dL mg/dL    Nitrites: NEGATIVE    Leukocyte Esterase: NEGATIVE    ASSESSMENT:      ICD-10 Details  1 GU:   Prostate Cancer - C61    PLAN:           Schedule Return Visit/Planned Activity: Keep Scheduled Appointment          Document Letter(s):  Created for Patient: Clinical Summary         Notes:   1. Favorable intermediate risk prostate cancer: He will plan on proceeding with a bilateral nerve-sparing robot assisted  laparoscopic radical prostatectomy and bilateral pelvic lymphadenectomy. This scheduled for later this month. All questions were answered to his stated satisfaction. We again reviewed the expected postoperative recovery process and the expectations with regard to recovery of urinary control and erectile function as well as his long-term outlook regarding his prostate cancer.   CC: Dr. Walker Kehr        Next Appointment:      Next Appointment: 12/02/2019 02:00 PM    Appointment Type: Laboratory Appointment    Location: [facility] [phone]    Provider: Lab LAB    Reason for Visit: 6 mo PSA w Reflex      * Signed by Raynelle Bring, M.D. on 11/22/19 at 2:58 PM (EDT)*

## 2019-12-05 ENCOUNTER — Observation Stay (HOSPITAL_COMMUNITY)
Admission: RE | Admit: 2019-12-05 | Discharge: 2019-12-06 | Disposition: A | Discharge status: Home / Self Care | Payer: Federal, State, Local not specified - PPO | Attending: Urology | Admitting: Urology

## 2019-12-05 ENCOUNTER — Ambulatory Visit (HOSPITAL_COMMUNITY): Payer: Federal, State, Local not specified - PPO | Admitting: Anesthesiology

## 2019-12-05 ENCOUNTER — Encounter (HOSPITAL_COMMUNITY): Payer: Self-pay | Admitting: Urology

## 2019-12-05 ENCOUNTER — Encounter (HOSPITAL_COMMUNITY)
Admission: RE | Disposition: A | Discharge status: Home / Self Care | Payer: Self-pay | Source: Home / Self Care | Attending: Urology

## 2019-12-05 ENCOUNTER — Other Ambulatory Visit: Payer: Self-pay

## 2019-12-05 DIAGNOSIS — E7800 Pure hypercholesterolemia, unspecified: Secondary | ICD-10-CM | POA: Diagnosis not present

## 2019-12-05 DIAGNOSIS — Z7982 Long term (current) use of aspirin: Secondary | ICD-10-CM | POA: Insufficient documentation

## 2019-12-05 DIAGNOSIS — Z79899 Other long term (current) drug therapy: Secondary | ICD-10-CM | POA: Insufficient documentation

## 2019-12-05 DIAGNOSIS — E785 Hyperlipidemia, unspecified: Secondary | ICD-10-CM | POA: Insufficient documentation

## 2019-12-05 DIAGNOSIS — C61 Malignant neoplasm of prostate: Principal | ICD-10-CM | POA: Insufficient documentation

## 2019-12-05 DIAGNOSIS — E039 Hypothyroidism, unspecified: Secondary | ICD-10-CM | POA: Diagnosis not present

## 2019-12-05 DIAGNOSIS — Z79890 Hormone replacement therapy (postmenopausal): Secondary | ICD-10-CM | POA: Insufficient documentation

## 2019-12-05 DIAGNOSIS — Z888 Allergy status to other drugs, medicaments and biological substances status: Secondary | ICD-10-CM | POA: Diagnosis not present

## 2019-12-05 DIAGNOSIS — F319 Bipolar disorder, unspecified: Secondary | ICD-10-CM | POA: Insufficient documentation

## 2019-12-05 DIAGNOSIS — Z881 Allergy status to other antibiotic agents status: Secondary | ICD-10-CM | POA: Insufficient documentation

## 2019-12-05 DIAGNOSIS — Z85810 Personal history of malignant neoplasm of tongue: Secondary | ICD-10-CM | POA: Insufficient documentation

## 2019-12-05 DIAGNOSIS — K219 Gastro-esophageal reflux disease without esophagitis: Secondary | ICD-10-CM | POA: Diagnosis not present

## 2019-12-05 HISTORY — PX: ROBOT ASSISTED LAPAROSCOPIC RADICAL PROSTATECTOMY: SHX5141

## 2019-12-05 HISTORY — PX: LYMPHADENECTOMY: SHX5960

## 2019-12-05 LAB — HEMOGLOBIN AND HEMATOCRIT, BLOOD
HCT: 39.2 % (ref 39.0–52.0)
Hemoglobin: 12.9 g/dL — ABNORMAL LOW (ref 13.0–17.0)

## 2019-12-05 LAB — TYPE AND SCREEN
ABO/RH(D): O POS
Antibody Screen: NEGATIVE

## 2019-12-05 SURGERY — XI ROBOTIC ASSISTED LAPAROSCOPIC RADICAL PROSTATECTOMY LEVEL 2
Anesthesia: General

## 2019-12-05 MED ORDER — PANTOPRAZOLE SODIUM 40 MG PO TBEC
80.0000 mg | DELAYED_RELEASE_TABLET | Freq: Every day | ORAL | Status: DC
  Administered 2019-12-05 – 2019-12-06 (×2): 80 mg via ORAL
  Filled 2019-12-05 (×2): qty 2

## 2019-12-05 MED ORDER — MONTELUKAST SODIUM 10 MG PO TABS
10.0000 mg | ORAL_TABLET | Freq: Every day | ORAL | Status: DC
  Administered 2019-12-06: 10 mg via ORAL
  Filled 2019-12-05: qty 1

## 2019-12-05 MED ORDER — OXYCODONE HCL 5 MG PO TABS: 5.0000 mg | ORAL_TABLET | Freq: Once | ORAL | Status: DC | PRN

## 2019-12-05 MED ORDER — ACETAMINOPHEN 500 MG PO TABS
1000.0000 mg | ORAL_TABLET | Freq: Once | ORAL | Status: AC
  Administered 2019-12-05: 1000 mg via ORAL
  Filled 2019-12-05: qty 2

## 2019-12-05 MED ORDER — SULFAMETHOXAZOLE-TRIMETHOPRIM 800-160 MG PO TABS
1.0000 | ORAL_TABLET | Freq: Two times a day (BID) | ORAL | 0 refills | Status: DC
Start: 2019-12-05 — End: 2020-06-13

## 2019-12-05 MED ORDER — GLYCOPYRROLATE PF 0.2 MG/ML IJ SOSY
PREFILLED_SYRINGE | INTRAMUSCULAR | Status: AC
  Filled 2019-12-05: qty 1

## 2019-12-05 MED ORDER — LACTATED RINGERS IV SOLN: INTRAVENOUS | Status: DC

## 2019-12-05 MED ORDER — VANCOMYCIN HCL IN DEXTROSE 1-5 GM/200ML-% IV SOLN
1000.0000 mg | Freq: Two times a day (BID) | INTRAVENOUS | Status: AC
  Administered 2019-12-05: 1000 mg via INTRAVENOUS
  Filled 2019-12-05: qty 200

## 2019-12-05 MED ORDER — ROCURONIUM BROMIDE 10 MG/ML (PF) SYRINGE
PREFILLED_SYRINGE | INTRAVENOUS | Status: DC | PRN
  Administered 2019-12-05: 60 mg via INTRAVENOUS
  Administered 2019-12-05: 20 mg via INTRAVENOUS
  Administered 2019-12-05: 10 mg via INTRAVENOUS
  Administered 2019-12-05 (×4): 20 mg via INTRAVENOUS

## 2019-12-05 MED ORDER — ACETAMINOPHEN 325 MG PO TABS
650.0000 mg | ORAL_TABLET | ORAL | Status: DC | PRN
  Administered 2019-12-05: 650 mg via ORAL
  Filled 2019-12-05: qty 2

## 2019-12-05 MED ORDER — PROPOFOL 10 MG/ML IV BOLUS
INTRAVENOUS | Status: DC | PRN
  Administered 2019-12-05: 200 mg via INTRAVENOUS

## 2019-12-05 MED ORDER — FLEET ENEMA 7-19 GM/118ML RE ENEM
1.0000 | ENEMA | Freq: Once | RECTAL | Status: DC
  Filled 2019-12-05: qty 1

## 2019-12-05 MED ORDER — MAGNESIUM CITRATE PO SOLN
1.0000 | Freq: Once | ORAL | Status: DC
  Filled 2019-12-05: qty 296

## 2019-12-05 MED ORDER — DOCUSATE SODIUM 100 MG PO CAPS
100.0000 mg | ORAL_CAPSULE | Freq: Two times a day (BID) | ORAL | Status: DC
  Administered 2019-12-05 – 2019-12-06 (×2): 100 mg via ORAL
  Filled 2019-12-05 (×2): qty 1

## 2019-12-05 MED ORDER — BACITRACIN-NEOMYCIN-POLYMYXIN 400-5-5000 EX OINT: 1.0000 "application " | TOPICAL_OINTMENT | Freq: Three times a day (TID) | CUTANEOUS | Status: DC | PRN

## 2019-12-05 MED ORDER — FENTANYL CITRATE (PF) 100 MCG/2ML IJ SOLN
INTRAMUSCULAR | Status: AC
  Filled 2019-12-05: qty 2

## 2019-12-05 MED ORDER — LEVOTHYROXINE SODIUM 75 MCG PO TABS
75.0000 ug | ORAL_TABLET | Freq: Every day | ORAL | Status: DC
  Administered 2019-12-06: 75 ug via ORAL
  Filled 2019-12-05 (×2): qty 1

## 2019-12-05 MED ORDER — HYDROMORPHONE HCL 1 MG/ML IJ SOLN
0.2500 mg | INTRAMUSCULAR | Status: DC | PRN
  Administered 2019-12-05 (×4): 0.5 mg via INTRAVENOUS

## 2019-12-05 MED ORDER — PHENYLEPHRINE 40 MCG/ML (10ML) SYRINGE FOR IV PUSH (FOR BLOOD PRESSURE SUPPORT)
PREFILLED_SYRINGE | INTRAVENOUS | Status: DC | PRN
  Administered 2019-12-05 (×2): 80 ug via INTRAVENOUS
  Administered 2019-12-05 (×2): 40 ug via INTRAVENOUS

## 2019-12-05 MED ORDER — OLANZAPINE 5 MG PO TABS
7.5000 mg | ORAL_TABLET | Freq: Every day | ORAL | Status: DC
  Administered 2019-12-05 – 2019-12-06 (×2): 7.5 mg via ORAL
  Filled 2019-12-05 (×2): qty 1

## 2019-12-05 MED ORDER — DIPHENHYDRAMINE HCL 50 MG/ML IJ SOLN: 12.5000 mg | Freq: Four times a day (QID) | INTRAMUSCULAR | Status: DC | PRN

## 2019-12-05 MED ORDER — ONDANSETRON HCL 4 MG/2ML IJ SOLN: 4.0000 mg | Freq: Once | INTRAMUSCULAR | Status: DC | PRN

## 2019-12-05 MED ORDER — SUGAMMADEX SODIUM 200 MG/2ML IV SOLN
INTRAVENOUS | Status: DC | PRN
  Administered 2019-12-05: 150 mg via INTRAVENOUS

## 2019-12-05 MED ORDER — PROPOFOL 10 MG/ML IV BOLUS
INTRAVENOUS | Status: AC
  Filled 2019-12-05: qty 20

## 2019-12-05 MED ORDER — LIDOCAINE 2% (20 MG/ML) 5 ML SYRINGE
INTRAMUSCULAR | Status: AC
  Filled 2019-12-05: qty 5

## 2019-12-05 MED ORDER — ONDANSETRON HCL 4 MG/2ML IJ SOLN
INTRAMUSCULAR | Status: DC | PRN
  Administered 2019-12-05: 4 mg via INTRAVENOUS

## 2019-12-05 MED ORDER — GLYCOPYRROLATE PF 0.2 MG/ML IJ SOSY
PREFILLED_SYRINGE | INTRAMUSCULAR | Status: DC | PRN
  Administered 2019-12-05: .2 mg via INTRAVENOUS

## 2019-12-05 MED ORDER — LIDOCAINE 2% (20 MG/ML) 5 ML SYRINGE
INTRAMUSCULAR | Status: DC | PRN
  Administered 2019-12-05: 60 mg via INTRAVENOUS

## 2019-12-05 MED ORDER — LAMOTRIGINE 100 MG PO TABS
200.0000 mg | ORAL_TABLET | Freq: Every day | ORAL | Status: DC
  Administered 2019-12-05: 200 mg via ORAL
  Filled 2019-12-05 (×2): qty 2

## 2019-12-05 MED ORDER — OXYCODONE HCL 5 MG/5ML PO SOLN: 5.0000 mg | Freq: Once | ORAL | Status: DC | PRN

## 2019-12-05 MED ORDER — ZOLPIDEM TARTRATE 5 MG PO TABS: 5.0000 mg | ORAL_TABLET | Freq: Every evening | ORAL | Status: DC | PRN

## 2019-12-05 MED ORDER — MIDAZOLAM HCL 2 MG/2ML IJ SOLN
INTRAMUSCULAR | Status: DC | PRN
  Administered 2019-12-05: 2 mg via INTRAVENOUS

## 2019-12-05 MED ORDER — DEXAMETHASONE SODIUM PHOSPHATE 10 MG/ML IJ SOLN
INTRAMUSCULAR | Status: AC
  Filled 2019-12-05: qty 1

## 2019-12-05 MED ORDER — VANCOMYCIN HCL IN DEXTROSE 1-5 GM/200ML-% IV SOLN
1000.0000 mg | Freq: Once | INTRAVENOUS | Status: AC
  Administered 2019-12-05: 1000 mg via INTRAVENOUS
  Filled 2019-12-05: qty 200

## 2019-12-05 MED ORDER — ONDANSETRON HCL 4 MG/2ML IJ SOLN
4.0000 mg | INTRAMUSCULAR | Status: DC | PRN
  Administered 2019-12-05 (×2): 4 mg via INTRAVENOUS
  Filled 2019-12-05 (×2): qty 2

## 2019-12-05 MED ORDER — FENTANYL CITRATE (PF) 100 MCG/2ML IJ SOLN
INTRAMUSCULAR | Status: DC | PRN
  Administered 2019-12-05 (×2): 50 ug via INTRAVENOUS
  Administered 2019-12-05: 25 ug via INTRAVENOUS
  Administered 2019-12-05: 100 ug via INTRAVENOUS
  Administered 2019-12-05: 25 ug via INTRAVENOUS
  Administered 2019-12-05: 50 ug via INTRAVENOUS

## 2019-12-05 MED ORDER — ONDANSETRON HCL 4 MG/2ML IJ SOLN
INTRAMUSCULAR | Status: AC
  Filled 2019-12-05: qty 2

## 2019-12-05 MED ORDER — MIDAZOLAM HCL 2 MG/2ML IJ SOLN
INTRAMUSCULAR | Status: AC
  Filled 2019-12-05: qty 2

## 2019-12-05 MED ORDER — LACTATED RINGERS IV SOLN
INTRAVENOUS | Status: DC | PRN
  Administered 2019-12-05: 1 mL

## 2019-12-05 MED ORDER — SODIUM CHLORIDE 0.9 % IR SOLN
Status: DC | PRN
  Administered 2019-12-05: 1000 mL

## 2019-12-05 MED ORDER — FLUTICASONE PROPIONATE 50 MCG/ACT NA SUSP
1.0000 | Freq: Every day | NASAL | Status: DC
  Administered 2019-12-06: 1 via NASAL
  Filled 2019-12-05: qty 16

## 2019-12-05 MED ORDER — BUPIVACAINE-EPINEPHRINE 0.25% -1:200000 IJ SOLN
INTRAMUSCULAR | Status: AC
  Filled 2019-12-05: qty 1

## 2019-12-05 MED ORDER — DEXAMETHASONE SODIUM PHOSPHATE 10 MG/ML IJ SOLN
INTRAMUSCULAR | Status: DC | PRN
  Administered 2019-12-05: 8 mg via INTRAVENOUS

## 2019-12-05 MED ORDER — KCL IN DEXTROSE-NACL 20-5-0.45 MEQ/L-%-% IV SOLN
INTRAVENOUS | Status: DC
  Filled 2019-12-05 (×4): qty 1000

## 2019-12-05 MED ORDER — LITHIUM CARBONATE 300 MG PO CAPS
600.0000 mg | ORAL_CAPSULE | Freq: Two times a day (BID) | ORAL | Status: DC
  Administered 2019-12-05 – 2019-12-06 (×2): 600 mg via ORAL
  Filled 2019-12-05 (×3): qty 2

## 2019-12-05 MED ORDER — SODIUM CHLORIDE 0.9 % IV BOLUS
1000.0000 mL | Freq: Once | INTRAVENOUS | Status: AC
  Administered 2019-12-05: 1000 mL via INTRAVENOUS

## 2019-12-05 MED ORDER — HEPARIN SODIUM (PORCINE) 1000 UNIT/ML IJ SOLN
INTRAMUSCULAR | Status: AC
  Filled 2019-12-05: qty 1

## 2019-12-05 MED ORDER — BUPIVACAINE-EPINEPHRINE 0.25% -1:200000 IJ SOLN
INTRAMUSCULAR | Status: DC | PRN
  Administered 2019-12-05: 28 mL

## 2019-12-05 MED ORDER — KETOROLAC TROMETHAMINE 15 MG/ML IJ SOLN
15.0000 mg | Freq: Four times a day (QID) | INTRAMUSCULAR | Status: DC
  Administered 2019-12-05 – 2019-12-06 (×4): 15 mg via INTRAVENOUS
  Filled 2019-12-05 (×4): qty 1

## 2019-12-05 MED ORDER — ROCURONIUM BROMIDE 10 MG/ML (PF) SYRINGE
PREFILLED_SYRINGE | INTRAVENOUS | Status: AC
  Filled 2019-12-05: qty 10

## 2019-12-05 MED ORDER — EPHEDRINE 5 MG/ML INJ
INTRAVENOUS | Status: AC
  Filled 2019-12-05: qty 10

## 2019-12-05 MED ORDER — PHENYLEPHRINE 40 MCG/ML (10ML) SYRINGE FOR IV PUSH (FOR BLOOD PRESSURE SUPPORT)
PREFILLED_SYRINGE | INTRAVENOUS | Status: AC
  Filled 2019-12-05: qty 40

## 2019-12-05 MED ORDER — HYDROMORPHONE HCL 1 MG/ML IJ SOLN
INTRAMUSCULAR | Status: AC
  Filled 2019-12-05: qty 1

## 2019-12-05 MED ORDER — TRAMADOL HCL 50 MG PO TABS: 50.0000 mg | ORAL_TABLET | Freq: Four times a day (QID) | ORAL | 0 refills | Status: DC | PRN

## 2019-12-05 MED ORDER — DIPHENHYDRAMINE HCL 12.5 MG/5ML PO ELIX: 12.5000 mg | ORAL_SOLUTION | Freq: Four times a day (QID) | ORAL | Status: DC | PRN

## 2019-12-05 MED ORDER — BELLADONNA ALKALOIDS-OPIUM 16.2-60 MG RE SUPP: 1.0000 | Freq: Four times a day (QID) | RECTAL | Status: DC | PRN

## 2019-12-05 MED ORDER — MORPHINE SULFATE (PF) 4 MG/ML IV SOLN: 2.0000 mg | INTRAVENOUS | Status: DC | PRN

## 2019-12-05 MED ORDER — STERILE WATER FOR IRRIGATION IR SOLN
Status: DC | PRN
  Administered 2019-12-05: 1000 mL

## 2019-12-05 MED ORDER — EPHEDRINE SULFATE-NACL 50-0.9 MG/10ML-% IV SOSY
PREFILLED_SYRINGE | INTRAVENOUS | Status: DC | PRN
  Administered 2019-12-05: 10 mg via INTRAVENOUS

## 2019-12-05 SURGICAL SUPPLY — 65 items
ADH SKN CLS APL DERMABOND .7 (GAUZE/BANDAGES/DRESSINGS) ×1
APL PRP STRL LF DISP 70% ISPRP (MISCELLANEOUS) ×1
APPLICATOR COTTON TIP 6 STRL (MISCELLANEOUS) ×2 IMPLANT
APPLICATOR COTTON TIP 6IN STRL (MISCELLANEOUS) ×3
BAG URINE DRAIN 2000ML AR STRL (UROLOGICAL SUPPLIES) ×1 IMPLANT
CATH FOLEY 2WAY SLVR 18FR 30CC (CATHETERS) ×3 IMPLANT
CATH ROBINSON RED A/P 16FR (CATHETERS) ×3 IMPLANT
CATH ROBINSON RED A/P 8FR (CATHETERS) ×3 IMPLANT
CATH TIEMANN FOLEY 18FR 5CC (CATHETERS) ×3 IMPLANT
CHLORAPREP W/TINT 26 (MISCELLANEOUS) ×3 IMPLANT
CLIP VESOLOCK LG 6/CT PURPLE (CLIP) ×6 IMPLANT
COVER SURGICAL LIGHT HANDLE (MISCELLANEOUS) ×3 IMPLANT
COVER TIP SHEARS 8 DVNC (MISCELLANEOUS) ×2 IMPLANT
COVER TIP SHEARS 8MM DA VINCI (MISCELLANEOUS) ×3
COVER WAND RF STERILE (DRAPES) IMPLANT
CUTTER ECHEON FLEX ENDO 45 340 (ENDOMECHANICALS) ×3 IMPLANT
DECANTER SPIKE VIAL GLASS SM (MISCELLANEOUS) ×3 IMPLANT
DERMABOND ADVANCED (GAUZE/BANDAGES/DRESSINGS) ×1
DERMABOND ADVANCED .7 DNX12 (GAUZE/BANDAGES/DRESSINGS) ×2 IMPLANT
DRAIN CHANNEL RND F F (WOUND CARE) IMPLANT
DRAPE ARM DVNC X/XI (DISPOSABLE) ×8 IMPLANT
DRAPE COLUMN DVNC XI (DISPOSABLE) ×2 IMPLANT
DRAPE DA VINCI XI ARM (DISPOSABLE) ×12
DRAPE DA VINCI XI COLUMN (DISPOSABLE) ×3
DRAPE SURG IRRIG POUCH 19X23 (DRAPES) ×3 IMPLANT
DRSG TEGADERM 4X4.75 (GAUZE/BANDAGES/DRESSINGS) ×3 IMPLANT
ELECT PENCIL ROCKER SW 15FT (MISCELLANEOUS) ×3 IMPLANT
ELECT REM PT RETURN 15FT ADLT (MISCELLANEOUS) ×3 IMPLANT
GLOVE BIO SURGEON STRL SZ 6.5 (GLOVE) ×3 IMPLANT
GLOVE BIOGEL M STRL SZ7.5 (GLOVE) ×6 IMPLANT
GOWN STRL REUS W/TWL LRG LVL3 (GOWN DISPOSABLE) ×9 IMPLANT
HOLDER FOLEY CATH W/STRAP (MISCELLANEOUS) ×3 IMPLANT
IRRIG SUCT STRYKERFLOW 2 WTIP (MISCELLANEOUS) ×3
IRRIGATION SUCT STRKRFLW 2 WTP (MISCELLANEOUS) ×2 IMPLANT
IV LACTATED RINGERS 1000ML (IV SOLUTION) ×3 IMPLANT
KIT TURNOVER KIT A (KITS) IMPLANT
NDL HYPO 21X1.5 SAFETY (NEEDLE) IMPLANT
NDL SAFETY ECLIPSE 18X1.5 (NEEDLE) ×2 IMPLANT
NEEDLE HYPO 18GX1.5 SHARP (NEEDLE) ×3
NEEDLE HYPO 21X1.5 SAFETY (NEEDLE) ×3 IMPLANT
PACK ROBOTIC CUSTOM UROLOGY (CUSTOM PROCEDURE TRAY) ×3 IMPLANT
PENCIL SMOKE EVACUATOR (MISCELLANEOUS) IMPLANT
RELOAD STAPLE 45 4.1 GRN THCK (STAPLE) ×2 IMPLANT
SEAL CANN UNIV 5-8 DVNC XI (MISCELLANEOUS) ×8 IMPLANT
SEAL XI 5MM-8MM UNIVERSAL (MISCELLANEOUS) ×12
SET TUBE SMOKE EVAC HIGH FLOW (TUBING) ×3 IMPLANT
SOLUTION ELECTROLUBE (MISCELLANEOUS) ×3 IMPLANT
STAPLE RELOAD 45 GRN (STAPLE) ×2 IMPLANT
STAPLE RELOAD 45MM GREEN (STAPLE) ×3
SUT ETHILON 3 0 PS 1 (SUTURE) ×3 IMPLANT
SUT MNCRL 3 0 RB1 (SUTURE) ×2 IMPLANT
SUT MNCRL 3 0 VIOLET RB1 (SUTURE) ×2 IMPLANT
SUT MNCRL AB 4-0 PS2 18 (SUTURE) ×6 IMPLANT
SUT MONOCRYL 3 0 RB1 (SUTURE) ×6
SUT VIC AB 0 CT1 27 (SUTURE) ×3
SUT VIC AB 0 CT1 27XBRD ANTBC (SUTURE) ×2 IMPLANT
SUT VIC AB 0 UR5 27 (SUTURE) ×3 IMPLANT
SUT VIC AB 2-0 SH 27 (SUTURE) ×3
SUT VIC AB 2-0 SH 27X BRD (SUTURE) ×2 IMPLANT
SUT VICRYL 0 UR6 27IN ABS (SUTURE) ×6 IMPLANT
SYR 27GX1/2 1ML LL SAFETY (SYRINGE) ×3 IMPLANT
SYR 3ML LL SCALE MARK (SYRINGE) ×1 IMPLANT
TOWEL OR NON WOVEN STRL DISP B (DISPOSABLE) ×3 IMPLANT
TROCAR XCEL NON-BLD 5MMX100MML (ENDOMECHANICALS) IMPLANT
WATER STERILE IRR 1000ML POUR (IV SOLUTION) ×3 IMPLANT

## 2019-12-05 NOTE — Progress Notes (Signed)
Urologic Surgery Post-op note  Subjective: The patient is doing well in PACU.  No complaints. Hgb 12.9 from 14.0 preop Pain controlled. No nausea Does have sensation to have a bowel movement.    Objective: Vital signs in last 24 hours: Temp:  [95.8 F (35.4 C)-98.2 F (36.8 C)] 97.4 F (36.3 C) (05/24 1530) Pulse Rate:  [78-101] 101 (05/24 1530) Resp:  [13-22] 17 (05/24 1530) BP: (112-127)/(64-88) 120/88 (05/24 1530) SpO2:  [98 %-100 %] 99 % (05/24 1530)  Intake/Output from previous day: No intake/output data recorded. Intake/Output this shift: Total I/O In: 2300 [I.V.:1300; IV Piggyback:1000] Out: 215 [Drains:65; Blood:150]  Physical Exam:  General: Alert and oriented. Abdomen: Soft, Nondistended.  GU: Foley in place with clear watermelon colored urine Incisions: Clean and dry. JP with SS output  Lab Results: Recent Labs    12/05/19 1419  HGB 12.9*  HCT 39.2    Assessment/Plan: POD#0 RALP with bilateral LND.   Recovering well on pathway.  Continue to monitor CLD Walk/OOB this evening AM labs Strict I/O including JP outputs

## 2019-12-05 NOTE — Anesthesia Postprocedure Evaluation (Signed)
Anesthesia Post Note  Patient: Adam Keller  Procedure(s) Performed: XI ROBOTIC ASSISTED LAPAROSCOPIC RADICAL PROSTATECTOMY LEVEL 2 (N/A ) LYMPHADENECTOMY, PELVIC (Bilateral )     Patient location during evaluation: PACU Anesthesia Type: General Level of consciousness: awake and alert, oriented and patient cooperative Pain management: pain level controlled Vital Signs Assessment: post-procedure vital signs reviewed and stable Respiratory status: spontaneous breathing, nonlabored ventilation and respiratory function stable Cardiovascular status: blood pressure returned to baseline and stable Postop Assessment: no apparent nausea or vomiting Anesthetic complications: no    Last Vitals:  Vitals:   12/05/19 1400 12/05/19 1415  BP: 112/64 121/72  Pulse: 86 88  Resp: (!) 22 14  Temp: (!) 35.4 C (!) 36.1 C  SpO2: 100% 100%    Last Pain:  Vitals:   12/05/19 1400  TempSrc:   PainSc: [place]

## 2019-12-05 NOTE — Interval H&P Note (Signed)
History and Physical Interval Note:  12/05/2019 9:26 AM  Adam Keller  has presented today for surgery, with the diagnosis of PROSTATE CANCER.  The various methods of treatment have been discussed with the patient and family. After consideration of risks, benefits and other options for treatment, the patient has consented to  Procedure(s): XI ROBOTIC ASSISTED LAPAROSCOPIC RADICAL PROSTATECTOMY LEVEL 2 (N/A) LYMPHADENECTOMY, PELVIC (Bilateral) as a surgical intervention.  The patient's history has been reviewed, patient examined, no change in status, stable for surgery.  I have reviewed the patient's chart and labs.  Questions were answered to the patient's satisfaction.     [employer]

## 2019-12-05 NOTE — Anesthesia Preprocedure Evaluation (Addendum)
Anesthesia Evaluation  Patient identified by MRN, date of birth, ID band Patient awake    Reviewed: Allergy & Precautions, NPO status , Patient's Chart, lab work & pertinent test results  Airway Mallampati: II  TM Distance: >3 FB Neck ROM: Full    Dental no notable dental hx. (+) Teeth Intact, Dental Advisory Given   Pulmonary former smoker,  Quit smoking 2010   Pulmonary exam normal breath sounds clear to auscultation       Cardiovascular negative cardio ROS Normal cardiovascular exam Rhythm:Regular Rate:Normal     Neuro/Psych  Headaches, PSYCHIATRIC DISORDERS Depression Bipolar Disorder    GI/Hepatic Neg liver ROS, GERD  Medicated and Controlled,  Endo/Other  Hypothyroidism   Renal/GU negative Renal ROS   Prostate ca    Musculoskeletal negative musculoskeletal ROS (+)   Abdominal   Peds negative pediatric ROS (+)  Hematology negative hematology ROS (+)   Anesthesia Other Findings Hx squamous cell can tongue s/p vocal cord surgery 2011, excision of tongue lesion and cervical LN removal   Reproductive/Obstetrics negative OB ROS                           Anesthesia Physical Anesthesia Plan  ASA: II  Anesthesia Plan: General   Post-op Pain Management:    Induction: Intravenous  PONV Risk Score and Plan: 3 and Ondansetron, Dexamethasone, Midazolam and Treatment may vary due to age or medical condition  Airway Management Planned: Oral ETT  Additional Equipment: None  Intra-op Plan:   Post-operative Plan: Extubation in OR  Informed Consent: I have reviewed the patients History and Physical, chart, labs and discussed the procedure including the risks, benefits and alternatives for the proposed anesthesia with the patient or authorized representative who has indicated his/her understanding and acceptance.     Dental advisory given  Plan Discussed with: CRNA  Anesthesia Plan  Comments:         Anesthesia Quick Evaluation

## 2019-12-05 NOTE — Transfer of Care (Signed)
Immediate Anesthesia Transfer of Care Note  Patient: Adam Keller  Procedure(s) Performed: XI ROBOTIC ASSISTED LAPAROSCOPIC RADICAL PROSTATECTOMY LEVEL 2 (N/A ) LYMPHADENECTOMY, PELVIC (Bilateral )  Patient Location: PACU  Anesthesia Type:General  Level of Consciousness: awake, alert  and oriented  Airway & Oxygen Therapy: Patient Spontanous Breathing and Patient connected to face mask oxygen  Post-op Assessment: Report given to RN, Post -op Vital signs reviewed and stable and Patient moving all extremities X 4  Post vital signs: Reviewed and stable  Last Vitals:  Vitals Value Taken Time  BP 112/64 12/05/19 1400  Temp    Pulse 82 12/05/19 1401  Resp 22 12/05/19 1401  SpO2 100 % 12/05/19 1401  Vitals shown include unvalidated device data.  Last Pain:  Vitals:   12/05/19 0938  TempSrc: Oral         Complications: No apparent anesthesia complications

## 2019-12-05 NOTE — Discharge Instructions (Signed)

## 2019-12-05 NOTE — Op Note (Signed)

## 2019-12-05 NOTE — Anesthesia Procedure Notes (Signed)
Procedure Name: Intubation Date/Time: 12/05/2019 10:23 AM Performed by: Niel Hummer, CRNA Pre-anesthesia Checklist: Patient identified, Emergency Drugs available, Suction available and Patient being monitored Patient Re-evaluated:Patient Re-evaluated prior to induction Oxygen Delivery Method: Circle system utilized Preoxygenation: Pre-oxygenation with 100% oxygen Induction Type: IV induction Ventilation: Oral airway inserted - appropriate to patient size and Two handed mask ventilation required Laryngoscope Size: Mac and 4 Grade View: Grade II Tube type: Oral Tube size: 7.5 mm Number of attempts: 1 Airway Equipment and Method: Stylet Placement Confirmation: ETT inserted through vocal cords under direct vision,  positive ETCO2 and breath sounds checked- equal and bilateral Secured at: 22 cm Tube secured with: Tape Dental Injury: Teeth and Oropharynx as per pre-operative assessment  Comments: DL x1 by EMT student, grade 3 view. DL by CRNA grade 2b view. Successful tube placement.

## 2019-12-06 DIAGNOSIS — Z7982 Long term (current) use of aspirin: Secondary | ICD-10-CM | POA: Diagnosis not present

## 2019-12-06 DIAGNOSIS — E785 Hyperlipidemia, unspecified: Secondary | ICD-10-CM | POA: Diagnosis not present

## 2019-12-06 DIAGNOSIS — Z888 Allergy status to other drugs, medicaments and biological substances status: Secondary | ICD-10-CM | POA: Diagnosis not present

## 2019-12-06 DIAGNOSIS — Z85810 Personal history of malignant neoplasm of tongue: Secondary | ICD-10-CM | POA: Diagnosis not present

## 2019-12-06 DIAGNOSIS — E7800 Pure hypercholesterolemia, unspecified: Secondary | ICD-10-CM | POA: Diagnosis not present

## 2019-12-06 DIAGNOSIS — C61 Malignant neoplasm of prostate: Secondary | ICD-10-CM | POA: Diagnosis not present

## 2019-12-06 DIAGNOSIS — F319 Bipolar disorder, unspecified: Secondary | ICD-10-CM | POA: Diagnosis not present

## 2019-12-06 DIAGNOSIS — E039 Hypothyroidism, unspecified: Secondary | ICD-10-CM | POA: Diagnosis not present

## 2019-12-06 DIAGNOSIS — Z881 Allergy status to other antibiotic agents status: Secondary | ICD-10-CM | POA: Diagnosis not present

## 2019-12-06 DIAGNOSIS — Z79899 Other long term (current) drug therapy: Secondary | ICD-10-CM | POA: Diagnosis not present

## 2019-12-06 DIAGNOSIS — Z79890 Hormone replacement therapy (postmenopausal): Secondary | ICD-10-CM | POA: Diagnosis not present

## 2019-12-06 LAB — HEMOGLOBIN AND HEMATOCRIT, BLOOD
HCT: 34.9 % — ABNORMAL LOW (ref 39.0–52.0)
Hemoglobin: 11.6 g/dL — ABNORMAL LOW (ref 13.0–17.0)

## 2019-12-06 MED ORDER — TRAMADOL HCL 50 MG PO TABS: 50.0000 mg | ORAL_TABLET | Freq: Four times a day (QID) | ORAL | Status: DC | PRN

## 2019-12-06 MED ORDER — CHLORHEXIDINE GLUCONATE CLOTH 2 % EX PADS: 6.0000 | MEDICATED_PAD | Freq: Every day | CUTANEOUS | Status: DC

## 2019-12-06 MED ORDER — BISACODYL 10 MG RE SUPP
10.0000 mg | Freq: Once | RECTAL | Status: AC
  Administered 2019-12-06: 10 mg via RECTAL
  Filled 2019-12-06: qty 1

## 2019-12-06 MED ORDER — ACETAMINOPHEN 500 MG PO TABS
1000.0000 mg | ORAL_TABLET | Freq: Four times a day (QID) | ORAL | Status: DC
  Administered 2019-12-06: 1000 mg via ORAL
  Filled 2019-12-06: qty 2

## 2019-12-06 NOTE — Progress Notes (Signed)
Patient ambulated on hallway  twice before bedtime, tolerated  well.

## 2019-12-06 NOTE — Progress Notes (Addendum)
Patient's IV removed.  Site WNL.  AVS reviewed with patient.  Verbalized understanding of discharge instructions, physician follow-up, medications.  Patient and patient's sister educated on foley catheter care and changing bags.  Verbalized understanding of instructions.  Patient denies pain.  Patient transported via wheelchair to main entrance at discharge.  Patient stable at time of discharge - foley catheter intact.

## 2019-12-06 NOTE — Discharge Summary (Signed)
Date of admission: 12/05/2019  Date of discharge: 12/06/2019  Admission diagnosis: Prostate Cancer  Discharging Physician: Dr. Raynelle Bring, MD  Discharge diagnosis: Prostate Cancer  History and Physical: For full details, please see admission history and physical. Briefly, Adam Keller is a 64 y.o. gentleman with localized prostate cancer.  After discussing management/treatment options, he elected to proceed with surgical treatment.  Hospital Course: AZEKIEL CREMER was taken to the operating room on 12/05/2019 and underwent a robotic assisted laparoscopic radical prostatectomy with bilateral pelvic lymphadenectomy. He tolerated this procedure well and without complications. Postoperatively, he was able to be transferred to a regular hospital room following recovery from anesthesia.  He was able to begin ambulating the night of surgery. He remained hemodynamically stable overnight.  He had excellent urine output with appropriately minimal output from his pelvic drain and his pelvic drain was removed on POD #1.  He was transitioned to oral pain medication, tolerated a clear liquid diet, and had met all discharge criteria and was able to be discharged home later on POD#1.  Laboratory values:  Recent Labs    12/05/19 1419 12/06/19 0507  HGB 12.9* 11.6*  HCT 39.2 34.9*    Disposition: Home  Discharge instruction: He was instructed to be ambulatory but to refrain from heavy lifting, strenuous activity, or driving. He was instructed on urethral catheter care.  Discharge medications:   Allergies as of 12/06/2019      Reactions   Fluoxetine Hcl Other (See Comments)   "severe mental reaction"   Azithromycin Diarrhea   Cephalexin Rash   Erythromycin Nausea Only   Ketorolac Tromethamine Nausea And Vomiting      Medication List    STOP taking these medications   aspirin EC 81 MG tablet   Cholecalciferol 25 MCG (1000 UT) tablet   dutasteride 0.5 MG capsule Commonly known as: AVODART    fish oil-omega-3 fatty acids 1000 MG capsule   multivitamin,tx-minerals tablet     TAKE these medications   colesevelam 625 MG tablet Commonly known as: WELCHOL Take 625-1,250 mg by mouth daily as needed. For diarrhea   lamoTRIgine 200 MG tablet Commonly known as: LAMICTAL Take 1 tablet (200 mg total) by mouth at bedtime.   levothyroxine 50 MCG tablet Commonly known as: Synthroid Take 1.5 tablets (75 mcg total) by mouth daily before breakfast.   lithium 600 MG capsule Take 1 capsule (600 mg total) by mouth 2 (two) times daily with a meal.   mometasone 50 MCG/ACT nasal spray Commonly known as: NASONEX SHAKE LIQUID AND USE 2 SPRAYS IN EACH NOSTRIL DAILY What changed: See the new instructions.   montelukast 10 MG tablet Commonly known as: SINGULAIR Take 1 tablet (10 mg total) by mouth daily.   OLANZapine 7.5 MG tablet Commonly known as: ZYPREXA Take 7.5 mg by mouth in the morning and at bedtime.   omeprazole 40 MG capsule Commonly known as: PRILOSEC Take 1 capsule (40 mg total) by mouth daily as needed. What changed: reasons to take this   promethazine-codeine 6.25-10 MG/5ML syrup Commonly known as: PHENERGAN with CODEINE Take 5 mLs by mouth every 4 (four) hours as needed.   sulfamethoxazole-trimethoprim 800-160 MG tablet Commonly known as: BACTRIM DS Take 1 tablet by mouth 2 (two) times daily. Start the day prior to foley removal appointment What changed: additional instructions   traMADol 50 MG tablet Commonly known as: Ultram Take 1-2 tablets (50-100 mg total) by mouth every 6 (six) hours as needed for moderate pain or  severe pain.       Followup: He will followup in 1 week for catheter removal and to discuss his surgical pathology results.

## 2019-12-06 NOTE — Progress Notes (Addendum)
Patient's JP drain removed per order.  Patient tolerated well.  Patient now in bed eating breakfast.

## 2019-12-06 NOTE — Progress Notes (Signed)
Urology Progress Note   1 Day Post-Op from robotic prostatectomy and bilateral pelvic lymphadenectomy. .   Subjective: NAEON.  Pain well controlled.  Mild nausea, well controlled without emesis.  Walked twice; feeling steady.  No flatus Tolerating catheter well without bladder spasms.  JP output 134ml overnight with 2.3L of urine output Hgb 11.6  Objective: Vital signs in last 24 hours: Temp:  [95.8 F (35.4 C)-98.2 F (36.8 C)] 97.9 F (36.6 C) (05/25 0502) Pulse Rate:  [78-103] 79 (05/25 0502) Resp:  [13-22] 17 (05/25 0502) BP: (94-133)/(64-88) 94/64 (05/25 0502) SpO2:  [96 %-100 %] 97 % (05/25 0502) Weight:  [72.2 kg] 72.2 kg (05/24 1557)  Intake/Output from previous day: 05/24 0701 - 05/25 0700 In: 3956.2 [I.V.:2956.2; IV Piggyback:1000] Out: [id number] [Urine:2300; Drains:815; Blood:150] Intake/Output this shift: No intake/output data recorded.  Physical Exam:  General: Alert and oriented CV: Regular rate Lungs: No increased work of breathing Abdomen: Soft, appropriately tender. Incisions c/d/i. JP SS GU: Foley in place draining clear yellow urine  Ext: NT, No erythema  Lab Results: Recent Labs    12/05/19 1419 12/06/19 0507  HGB 12.9* 11.6*  HCT 39.2 34.9*   No results for input(s): NA, K, CL, CO2, GLUCOSE, BUN, CREATININE, CALCIUM in the last 72 hours.  Studies/Results: No results found.  Assessment/Plan:  64 y.o. male s/p robotic assisted prostatectomy with bilateral pelvic lymphadenectomy.  Overall doing well post-op.   1) SL IVF 2) Ambulate, Incentive spirometry 3) Transition to oral pain medication 4) Dulcolax suppository 5) D/C pelvic drain 6) Plan for likely discharge later today   LOS: 0 days

## 2019-12-13 LAB — SURGICAL PATHOLOGY

## 2019-12-21 ENCOUNTER — Ambulatory Visit: Payer: Federal, State, Local not specified - PPO | Admitting: Internal Medicine

## 2020-01-05 DIAGNOSIS — M62838 Other muscle spasm: Secondary | ICD-10-CM | POA: Diagnosis not present

## 2020-01-05 DIAGNOSIS — M6281 Muscle weakness (generalized): Secondary | ICD-10-CM | POA: Diagnosis not present

## 2020-01-05 DIAGNOSIS — N393 Stress incontinence (female) (male): Secondary | ICD-10-CM | POA: Diagnosis not present

## 2020-01-20 DIAGNOSIS — K148 Other diseases of tongue: Secondary | ICD-10-CM | POA: Diagnosis not present

## 2020-01-20 DIAGNOSIS — C029 Malignant neoplasm of tongue, unspecified: Secondary | ICD-10-CM | POA: Diagnosis not present

## 2020-01-25 DIAGNOSIS — N393 Stress incontinence (female) (male): Secondary | ICD-10-CM | POA: Diagnosis not present

## 2020-01-25 DIAGNOSIS — M62838 Other muscle spasm: Secondary | ICD-10-CM | POA: Diagnosis not present

## 2020-01-25 DIAGNOSIS — M6281 Muscle weakness (generalized): Secondary | ICD-10-CM | POA: Diagnosis not present

## 2020-01-30 DIAGNOSIS — F5101 Primary insomnia: Secondary | ICD-10-CM | POA: Diagnosis not present

## 2020-01-30 DIAGNOSIS — F3174 Bipolar disorder, in full remission, most recent episode manic: Secondary | ICD-10-CM | POA: Diagnosis not present

## 2020-02-01 ENCOUNTER — Other Ambulatory Visit: Payer: Self-pay

## 2020-02-01 ENCOUNTER — Ambulatory Visit (INDEPENDENT_AMBULATORY_CARE_PROVIDER_SITE_OTHER): Payer: Federal, State, Local not specified - PPO | Admitting: Internal Medicine

## 2020-02-01 ENCOUNTER — Encounter: Payer: Self-pay | Admitting: Internal Medicine

## 2020-02-01 VITALS — BP 120/78 | HR 89 | Temp 98.9°F | Ht 72.0 in | Wt 159.0 lb

## 2020-02-01 DIAGNOSIS — R634 Abnormal weight loss: Secondary | ICD-10-CM | POA: Diagnosis not present

## 2020-02-01 DIAGNOSIS — R7989 Other specified abnormal findings of blood chemistry: Secondary | ICD-10-CM

## 2020-02-01 DIAGNOSIS — C029 Malignant neoplasm of tongue, unspecified: Secondary | ICD-10-CM

## 2020-02-01 DIAGNOSIS — E039 Hypothyroidism, unspecified: Secondary | ICD-10-CM

## 2020-02-01 DIAGNOSIS — C61 Malignant neoplasm of prostate: Secondary | ICD-10-CM

## 2020-02-01 DIAGNOSIS — F319 Bipolar disorder, unspecified: Secondary | ICD-10-CM | POA: Diagnosis not present

## 2020-02-01 LAB — COMPLETE METABOLIC PANEL WITH GFR
AG Ratio: 2.9 (calc) — ABNORMAL HIGH (ref 1.0–2.5)
ALT: 7 U/L — ABNORMAL LOW (ref 9–46)
AST: 18 U/L (ref 10–35)
Albumin: 4.7 g/dL (ref 3.6–5.1)
Alkaline phosphatase (APISO): 72 U/L (ref 35–144)
BUN: 16 mg/dL (ref 7–25)
CO2: 26 mmol/L (ref 20–32)
Calcium: 9.9 mg/dL (ref 8.6–10.3)
Chloride: 108 mmol/L (ref 98–110)
Creat: 0.9 mg/dL (ref 0.70–1.25)
GFR, Est African American: 106 mL/min/{1.73_m2} (ref >=60)
GFR, Est Non African American: 91 mL/min/{1.73_m2} (ref >=60)
Globulin: 1.6 g/dL (calc) — ABNORMAL LOW (ref 1.9–3.7)
Glucose, Bld: 102 mg/dL — ABNORMAL HIGH (ref 65–99)
Potassium: 4.5 mmol/L (ref 3.5–5.3)
Sodium: 140 mmol/L (ref 135–146)
Total Bilirubin: 0.4 mg/dL (ref 0.2–1.2)
Total Protein: 6.3 g/dL (ref 6.1–8.1)

## 2020-02-01 NOTE — Addendum Note (Signed)
Addended by: Cresenciano Lick on: 02/01/2020 02:07 PM   Modules accepted: Orders

## 2020-02-01 NOTE — Assessment & Plan Note (Signed)
Residual pain Ultrasoft toothbrushes

## 2020-02-01 NOTE — Assessment & Plan Note (Signed)
Wt Readings from Last 3 Encounters:  02/01/20 159 lb (72.1 kg)  12/05/19 159 lb 2.8 oz (72.2 kg)  11/28/19 161 lb 8 oz (73.3 kg)

## 2020-02-01 NOTE — Assessment & Plan Note (Signed)
Labs

## 2020-02-01 NOTE — Assessment & Plan Note (Signed)
On Levothroid 

## 2020-02-01 NOTE — Assessment & Plan Note (Signed)
Post-op incontinence

## 2020-02-01 NOTE — Progress Notes (Signed)
Subjective:  Patient ID: Adam Keller, male    DOB: May 05, 1956  Age: 64 y.o. MRN: [id number]  CC: No chief complaint on file.   HPI Adam Keller presents for prostate cancer, incontinence, hypothyroidism f/u  Outpatient Medications Prior to Visit  Medication Sig Dispense Refill  . colesevelam (WELCHOL) 625 MG tablet Take 625-1,250 mg by mouth daily as needed. For diarrhea     . lamoTRIgine (LAMICTAL) 200 MG tablet Take 1 tablet (200 mg total) by mouth at bedtime. 30 tablet 1  . levothyroxine (SYNTHROID) 50 MCG tablet Take 1.5 tablets (75 mcg total) by mouth daily before breakfast. 135 tablet 3  . lithium carbonate 600 MG capsule Take 1 capsule (600 mg total) by mouth 2 (two) times daily with a meal. 60 capsule 1  . mometasone (NASONEX) 50 MCG/ACT nasal spray SHAKE LIQUID AND USE 2 SPRAYS IN EACH NOSTRIL DAILY (Patient taking differently: Place 2 sprays into the nose daily. ) 51 g 3  . montelukast (SINGULAIR) 10 MG tablet Take 1 tablet (10 mg total) by mouth daily. 90 tablet 3  . OLANZapine (ZYPREXA) 7.5 MG tablet Take 7.5 mg by mouth in the morning and at bedtime.     Marland Kitchen omeprazole (PRILOSEC) 40 MG capsule Take 1 capsule (40 mg total) by mouth daily as needed. (Patient taking differently: Take 40 mg by mouth daily as needed (acid reflux). ) 30 capsule 2  . promethazine-codeine (PHENERGAN WITH CODEINE) 6.25-10 MG/5ML syrup Take 5 mLs by mouth every 4 (four) hours as needed. (Patient not taking: Reported on 11/16/2019) 300 mL 0  . sulfamethoxazole-trimethoprim (BACTRIM DS) 800-160 MG tablet Take 1 tablet by mouth 2 (two) times daily. Start the day prior to foley removal appointment 6 tablet 0  . traMADol (ULTRAM) 50 MG tablet Take 1-2 tablets (50-100 mg total) by mouth every 6 (six) hours as needed for moderate pain or severe pain. 20 tablet 0   No facility-administered medications prior to visit.    ROS: Review of Systems  Constitutional: Negative for appetite change, fatigue and  unexpected weight change.  HENT: Negative for congestion, nosebleeds, sneezing, sore throat and trouble swallowing.   Eyes: Negative for itching and visual disturbance.  Respiratory: Negative for cough.   Cardiovascular: Negative for chest pain, palpitations and leg swelling.  Gastrointestinal: Negative for abdominal distention, blood in stool, diarrhea and nausea.  Genitourinary: Positive for urgency. Negative for frequency and hematuria.  Musculoskeletal: Negative for back pain, gait problem, joint swelling and neck pain.  Skin: Negative for rash.  Neurological: Negative for dizziness, tremors, speech difficulty and weakness.  Psychiatric/Behavioral: Negative for agitation, dysphoric mood, sleep disturbance and suicidal ideas. The patient is not nervous/anxious.     Objective:  BP 120/78 (BP Location: Left Arm, Patient Position: Sitting, Cuff Size: Normal)   Pulse 89   Temp 98.9 F (37.2 C) (Oral)   Ht 6' (1.829 m)   Wt 159 lb (72.1 kg)   SpO2 97%   BMI 21.56 kg/m   BP Readings from Last 3 Encounters:  02/01/20 120/78  12/06/19 112/72  11/28/19 115/79    Wt Readings from Last 3 Encounters:  02/01/20 159 lb (72.1 kg)  12/05/19 159 lb 2.8 oz (72.2 kg)  11/28/19 161 lb 8 oz (73.3 kg)    Physical Exam Constitutional:      General: He is not in acute distress.    Appearance: He is well-developed.     Comments: NAD  Eyes:     Conjunctiva/sclera:  Conjunctivae normal.     Pupils: Pupils are equal, round, and reactive to light.  Neck:     Thyroid: No thyromegaly.     Vascular: No JVD.  Cardiovascular:     Rate and Rhythm: Normal rate and regular rhythm.     Heart sounds: Normal heart sounds. No murmur heard.  No friction rub. No gallop.   Pulmonary:     Effort: Pulmonary effort is normal. No respiratory distress.     Breath sounds: Normal breath sounds. No wheezing or rales.  Chest:     Chest wall: No tenderness.  Abdominal:     General: Bowel sounds are normal.  There is no distension.     Palpations: Abdomen is soft. There is no mass.     Tenderness: There is no abdominal tenderness. There is no guarding or rebound.  Musculoskeletal:        General: No tenderness. Normal range of motion.     Cervical back: Normal range of motion.  Lymphadenopathy:     Cervical: No cervical adenopathy.  Skin:    General: Skin is warm and dry.     Findings: No rash.  Neurological:     Mental Status: He is alert and oriented to person, place, and time.     Cranial Nerves: No cranial nerve deficit.     Motor: No abnormal muscle tone.     Coordination: Coordination normal.     Gait: Gait normal.     Deep Tendon Reflexes: Reflexes are normal and symmetric.  Psychiatric:        Behavior: Behavior normal.        Thought Content: Thought content normal.        Judgment: Judgment normal.     Lab Results  Component Value Date   WBC 3.9 (L) 11/28/2019   HGB 11.6 (L) 12/06/2019   HCT 34.9 (L) 12/06/2019   PLT 212 11/28/2019   GLUCOSE 104 (H) 11/28/2019   CHOL 204 (H) 12/03/2017   TRIG 35 12/03/2017   HDL 71 12/03/2017   LDLDIRECT 116.5 01/02/2011   LDLCALC 126 (H) 12/03/2017   ALT 9 05/16/2019   AST 20 05/16/2019   NA 143 11/28/2019   K 4.7 11/28/2019   CL 110 11/28/2019   CREATININE 1.00 11/28/2019   BUN 15 11/28/2019   CO2 26 11/28/2019   TSH 0.86 09/20/2019   PSA 5.20 (H) 05/08/2014   HGBA1C 4.7 (L) 12/03/2017    No results found.  Assessment & Plan:    Walker Kehr, MD

## 2020-02-02 LAB — CBC WITH DIFFERENTIAL/PLATELET
Absolute Monocytes: 353 cells/uL (ref 200–950)
Basophils Absolute: 49 cells/uL (ref 0–200)
Basophils Relative: 1.2 %
Eosinophils Absolute: 254 cells/uL (ref 15–500)
Eosinophils Relative: 6.2 %
HCT: 43.1 % (ref 38.5–50.0)
Hemoglobin: 14.7 g/dL (ref 13.2–17.1)
Lymphs Abs: 1082 cells/uL (ref 850–3900)
MCH: 30.9 pg (ref 27.0–33.0)
MCHC: 34.1 g/dL (ref 32.0–36.0)
MCV: 90.7 fL (ref 80.0–100.0)
MPV: 10.3 fL (ref 7.5–12.5)
Monocytes Relative: 8.6 %
Neutro Abs: 2362 cells/uL (ref 1500–7800)
Neutrophils Relative %: 57.6 %
Platelets: 188 10*3/uL (ref 140–400)
RBC: 4.75 10*6/uL (ref 4.20–5.80)
RDW: 13.1 % (ref 11.0–15.0)
Total Lymphocyte: 26.4 %
WBC: 4.1 10*3/uL (ref 3.8–10.8)

## 2020-02-02 LAB — LITHIUM LEVEL: Lithium Lvl: 0.8 mmol/L (ref 0.6–1.2)

## 2020-02-02 LAB — T4, FREE: Free T4: 1.1 ng/dL (ref 0.8–1.8)

## 2020-02-02 LAB — AMMONIA: Ammonia: 50 umol/L (ref <=72)

## 2020-02-02 LAB — TSH: TSH: 2.18 mIU/L (ref 0.40–4.50)

## 2020-02-22 DIAGNOSIS — N393 Stress incontinence (female) (male): Secondary | ICD-10-CM | POA: Diagnosis not present

## 2020-02-22 DIAGNOSIS — M62838 Other muscle spasm: Secondary | ICD-10-CM | POA: Diagnosis not present

## 2020-02-22 DIAGNOSIS — M6281 Muscle weakness (generalized): Secondary | ICD-10-CM | POA: Diagnosis not present

## 2020-03-09 DIAGNOSIS — C61 Malignant neoplasm of prostate: Secondary | ICD-10-CM | POA: Diagnosis not present

## 2020-03-23 DIAGNOSIS — C029 Malignant neoplasm of tongue, unspecified: Secondary | ICD-10-CM | POA: Diagnosis not present

## 2020-03-25 ENCOUNTER — Other Ambulatory Visit: Payer: Self-pay | Admitting: Internal Medicine

## 2020-03-28 DIAGNOSIS — M6281 Muscle weakness (generalized): Secondary | ICD-10-CM | POA: Diagnosis not present

## 2020-03-28 DIAGNOSIS — N393 Stress incontinence (female) (male): Secondary | ICD-10-CM | POA: Diagnosis not present

## 2020-03-28 DIAGNOSIS — M62838 Other muscle spasm: Secondary | ICD-10-CM | POA: Diagnosis not present

## 2020-03-30 DIAGNOSIS — C61 Malignant neoplasm of prostate: Secondary | ICD-10-CM | POA: Diagnosis not present

## 2020-03-30 DIAGNOSIS — N393 Stress incontinence (female) (male): Secondary | ICD-10-CM | POA: Diagnosis not present

## 2020-05-02 DIAGNOSIS — H2513 Age-related nuclear cataract, bilateral: Secondary | ICD-10-CM | POA: Diagnosis not present

## 2020-05-02 DIAGNOSIS — H5212 Myopia, left eye: Secondary | ICD-10-CM | POA: Diagnosis not present

## 2020-05-23 DIAGNOSIS — C029 Malignant neoplasm of tongue, unspecified: Secondary | ICD-10-CM | POA: Diagnosis not present

## 2020-05-30 ENCOUNTER — Other Ambulatory Visit: Payer: Self-pay | Admitting: Internal Medicine

## 2020-06-05 ENCOUNTER — Ambulatory Visit: Payer: Federal, State, Local not specified - PPO | Admitting: Internal Medicine

## 2020-06-13 ENCOUNTER — Other Ambulatory Visit: Payer: Self-pay

## 2020-06-13 ENCOUNTER — Encounter: Payer: Self-pay | Admitting: Internal Medicine

## 2020-06-13 ENCOUNTER — Ambulatory Visit (INDEPENDENT_AMBULATORY_CARE_PROVIDER_SITE_OTHER): Payer: Federal, State, Local not specified - PPO | Admitting: Internal Medicine

## 2020-06-13 VITALS — BP 120/78 | HR 80 | Temp 98.7°F | Wt 166.0 lb

## 2020-06-13 DIAGNOSIS — Z23 Encounter for immunization: Secondary | ICD-10-CM

## 2020-06-13 DIAGNOSIS — E039 Hypothyroidism, unspecified: Secondary | ICD-10-CM

## 2020-06-13 DIAGNOSIS — C029 Malignant neoplasm of tongue, unspecified: Secondary | ICD-10-CM

## 2020-06-13 DIAGNOSIS — R7989 Other specified abnormal findings of blood chemistry: Secondary | ICD-10-CM

## 2020-06-13 DIAGNOSIS — F312 Bipolar disorder, current episode manic severe with psychotic features: Secondary | ICD-10-CM | POA: Diagnosis not present

## 2020-06-13 DIAGNOSIS — C61 Malignant neoplasm of prostate: Secondary | ICD-10-CM | POA: Diagnosis not present

## 2020-06-13 LAB — COMPREHENSIVE METABOLIC PANEL
ALT: 9 U/L (ref 0–53)
AST: 22 U/L (ref 0–37)
Albumin: 4.6 g/dL (ref 3.5–5.2)
Alkaline Phosphatase: 68 U/L (ref 39–117)
BUN: 17 mg/dL (ref 6–23)
CO2: 30 mEq/L (ref 19–32)
Calcium: 9.8 mg/dL (ref 8.4–10.5)
Chloride: 105 mEq/L (ref 96–112)
Creatinine, Ser: 0.93 mg/dL (ref 0.40–1.50)
GFR: 87.59 mL/min (ref >60.00)
Glucose, Bld: 93 mg/dL (ref 70–99)
Potassium: 4 mEq/L (ref 3.5–5.1)
Sodium: 139 mEq/L (ref 135–145)
Total Bilirubin: 0.5 mg/dL (ref 0.2–1.2)
Total Protein: 6.8 g/dL (ref 6.0–8.3)

## 2020-06-13 LAB — CBC WITH DIFFERENTIAL/PLATELET
Basophils Absolute: 0.1 10*3/uL (ref 0.0–0.1)
Basophils Relative: 1.4 % (ref 0.0–3.0)
Eosinophils Absolute: 0.3 10*3/uL (ref 0.0–0.7)
Eosinophils Relative: 6.2 % — ABNORMAL HIGH (ref 0.0–5.0)
HCT: 43.3 % (ref 39.0–52.0)
Hemoglobin: 15.2 g/dL (ref 13.0–17.0)
Lymphocytes Relative: 25.9 % (ref 12.0–46.0)
Lymphs Abs: 1.3 10*3/uL (ref 0.7–4.0)
MCHC: 35.1 g/dL (ref 30.0–36.0)
MCV: 93.3 fl (ref 78.0–100.0)
Monocytes Absolute: 0.4 10*3/uL (ref 0.1–1.0)
Monocytes Relative: 7.9 % (ref 3.0–12.0)
Neutro Abs: 2.9 10*3/uL (ref 1.4–7.7)
Neutrophils Relative %: 58.6 % (ref 43.0–77.0)
Platelets: 202 10*3/uL (ref 150.0–400.0)
RBC: 4.64 Mil/uL (ref 4.22–5.81)
RDW: 12.6 % (ref 11.5–15.5)
WBC: 4.9 10*3/uL (ref 4.0–10.5)

## 2020-06-13 LAB — T4, FREE: Free T4: 0.64 ng/dL (ref 0.60–1.60)

## 2020-06-13 LAB — TSH: TSH: 1.53 u[IU]/mL (ref 0.35–4.50)

## 2020-06-13 LAB — AMMONIA: Ammonia: 29 umol/L (ref 11–35)

## 2020-06-13 NOTE — Assessment & Plan Note (Signed)
Labs

## 2020-06-13 NOTE — Assessment & Plan Note (Signed)
7 mushroom blend capsules

## 2020-06-13 NOTE — Patient Instructions (Signed)
7 mushroom blend capsules - [place]

## 2020-06-13 NOTE — Addendum Note (Signed)
Addended by: Cresenciano Lick on: 06/13/2020 02:55 PM   Modules accepted: Orders

## 2020-06-13 NOTE — Assessment & Plan Note (Signed)
labs

## 2020-06-13 NOTE — Progress Notes (Signed)
Subjective:  Patient ID: Adam Keller, male    DOB: [birth year]  Age: 64 y.o. MRN: [id number]  CC: Follow-up   HPI Adam Keller presents for hypothyroidism, prostate  cancer, incontinence  Outpatient Medications Prior to Visit  Medication Sig Dispense Refill  . colesevelam (WELCHOL) 625 MG tablet Take 625-1,250 mg by mouth daily as needed. For diarrhea     . lamoTRIgine (LAMICTAL) 200 MG tablet Take 1 tablet (200 mg total) by mouth at bedtime. 30 tablet 1  . levothyroxine (SYNTHROID) 50 MCG tablet TAKE 1 AND 1/2 TABLETS(75 MCG) BY MOUTH DAILY BEFORE BREAKFAST 135 tablet 3  . lithium carbonate 600 MG capsule Take 1 capsule (600 mg total) by mouth 2 (two) times daily with a meal. 60 capsule 1  . mometasone (NASONEX) 50 MCG/ACT nasal spray SHAKE LIQUID AND USE 2 SPRAYS IN EACH NOSTRIL DAILY 51 g 3  . montelukast (SINGULAIR) 10 MG tablet TAKE 1 TABLET BY MOUTH EVERY DAY 90 tablet 2  . OLANZapine (ZYPREXA) 7.5 MG tablet Take 7.5 mg by mouth in the morning and at bedtime.     Marland Kitchen omeprazole (PRILOSEC) 40 MG capsule Take 1 capsule (40 mg total) by mouth daily as needed. (Patient taking differently: Take 40 mg by mouth daily as needed (acid reflux). ) 30 capsule 2  . promethazine-codeine (PHENERGAN WITH CODEINE) 6.25-10 MG/5ML syrup Take 5 mLs by mouth every 4 (four) hours as needed. (Patient not taking: Reported on 11/16/2019) 300 mL 0  . sulfamethoxazole-trimethoprim (BACTRIM DS) 800-160 MG tablet Take 1 tablet by mouth 2 (two) times daily. Start the day prior to foley removal appointment 6 tablet 0  . traMADol (ULTRAM) 50 MG tablet Take 1-2 tablets (50-100 mg total) by mouth every 6 (six) hours as needed for moderate pain or severe pain. 20 tablet 0   No facility-administered medications prior to visit.    ROS: Review of Systems  Constitutional: Negative for appetite change, fatigue and unexpected weight change.  HENT: Negative for congestion, nosebleeds, sneezing, sore throat and trouble  swallowing.   Eyes: Negative for itching and visual disturbance.  Respiratory: Negative for cough.   Cardiovascular: Negative for chest pain, palpitations and leg swelling.  Gastrointestinal: Negative for abdominal distention, blood in stool, diarrhea and nausea.  Genitourinary: Positive for urgency. Negative for frequency and hematuria.  Musculoskeletal: Negative for back pain, gait problem, joint swelling and neck pain.  Skin: Negative for rash.  Neurological: Negative for dizziness, tremors, speech difficulty and weakness.  Psychiatric/Behavioral: Negative for agitation, dysphoric mood, sleep disturbance and suicidal ideas. The patient is not nervous/anxious.     Objective:  BP 120/78 (BP Location: Left Arm)   Pulse 80   Temp 98.7 F (37.1 C) (Oral)   Wt 166 lb (75.3 kg)   SpO2 99%   BMI 22.51 kg/m   BP Readings from Last 3 Encounters:  06/13/20 120/78  02/01/20 120/78  12/06/19 112/72    Wt Readings from Last 3 Encounters:  06/13/20 166 lb (75.3 kg)  02/01/20 159 lb (72.1 kg)  12/05/19 159 lb 2.8 oz (72.2 kg)    Physical Exam Constitutional:      General: He is not in acute distress.    Appearance: He is well-developed.     Comments: NAD  Eyes:     Conjunctiva/sclera: Conjunctivae normal.     Pupils: Pupils are equal, round, and reactive to light.  Neck:     Thyroid: No thyromegaly.     Vascular: No JVD.  Cardiovascular:     Rate and Rhythm: Normal rate and regular rhythm.     Heart sounds: Normal heart sounds. No murmur heard.  No friction rub. No gallop.   Pulmonary:     Effort: Pulmonary effort is normal. No respiratory distress.     Breath sounds: Normal breath sounds. No wheezing or rales.  Chest:     Chest wall: No tenderness.  Abdominal:     General: Bowel sounds are normal. There is no distension.     Palpations: Abdomen is soft. There is no mass.     Tenderness: There is no abdominal tenderness. There is no guarding or rebound.  Musculoskeletal:         General: No tenderness. Normal range of motion.     Cervical back: Normal range of motion.  Lymphadenopathy:     Cervical: No cervical adenopathy.  Skin:    General: Skin is warm and dry.     Findings: No rash.  Neurological:     Mental Status: He is alert and oriented to person, place, and time.     Cranial Nerves: No cranial nerve deficit.     Motor: No abnormal muscle tone.     Coordination: Coordination normal.     Gait: Gait normal.     Deep Tendon Reflexes: Reflexes are normal and symmetric.  Psychiatric:        Behavior: Behavior normal.        Thought Content: Thought content normal.        Judgment: Judgment normal.     Lab Results  Component Value Date   WBC 4.1 02/01/2020   HGB 14.7 02/01/2020   HCT 43.1 02/01/2020   PLT 188 02/01/2020   GLUCOSE 102 (H) 02/01/2020   CHOL 204 (H) 12/03/2017   TRIG 35 12/03/2017   HDL 71 12/03/2017   LDLDIRECT 116.5 01/02/2011   LDLCALC 126 (H) 12/03/2017   ALT 7 (L) 02/01/2020   AST 18 02/01/2020   NA 140 02/01/2020   K 4.5 02/01/2020   CL 108 02/01/2020   CREATININE 0.90 02/01/2020   BUN 16 02/01/2020   CO2 26 02/01/2020   TSH 2.18 02/01/2020   PSA 5.20 (H) 05/08/2014   HGBA1C 4.7 (L) 12/03/2017    No results found.  Assessment & Plan:    Walker Kehr, MD

## 2020-06-13 NOTE — Assessment & Plan Note (Addendum)
Doing well 7 mushroom blend capsules

## 2020-06-14 LAB — LITHIUM LEVEL: Lithium Lvl: 0.7 mmol/L (ref 0.6–1.2)

## 2020-08-02 DIAGNOSIS — C029 Malignant neoplasm of tongue, unspecified: Secondary | ICD-10-CM | POA: Diagnosis not present

## 2020-08-03 DIAGNOSIS — F5101 Primary insomnia: Secondary | ICD-10-CM | POA: Diagnosis not present

## 2020-08-03 DIAGNOSIS — F3174 Bipolar disorder, in full remission, most recent episode manic: Secondary | ICD-10-CM | POA: Diagnosis not present

## 2020-08-28 DIAGNOSIS — L02215 Cutaneous abscess of perineum: Secondary | ICD-10-CM | POA: Diagnosis not present

## 2020-09-04 ENCOUNTER — Other Ambulatory Visit: Payer: Self-pay

## 2020-09-05 ENCOUNTER — Ambulatory Visit: Payer: Federal, State, Local not specified - PPO | Admitting: Internal Medicine

## 2020-09-05 ENCOUNTER — Encounter: Payer: Self-pay | Admitting: Internal Medicine

## 2020-09-05 DIAGNOSIS — L0292 Furuncle, unspecified: Secondary | ICD-10-CM

## 2020-09-05 DIAGNOSIS — R220 Localized swelling, mass and lump, head: Secondary | ICD-10-CM | POA: Diagnosis not present

## 2020-09-05 DIAGNOSIS — T370X1A Poisoning by sulfonamides, accidental (unintentional), initial encounter: Secondary | ICD-10-CM | POA: Diagnosis not present

## 2020-09-05 MED ORDER — MUPIROCIN 2 % EX OINT: TOPICAL_OINTMENT | CUTANEOUS | 0 refills | Status: DC

## 2020-09-05 MED ORDER — TRIAMCINOLONE ACETONIDE 0.5 % EX CREA: 1.0000 "application " | TOPICAL_CREAM | Freq: Four times a day (QID) | CUTANEOUS | 0 refills | Status: DC

## 2020-09-05 MED ORDER — MUPIROCIN 2 % EX OINT
TOPICAL_OINTMENT | CUTANEOUS | 0 refills | Status: DC
Start: 2020-09-05 — End: 2020-12-12

## 2020-09-05 NOTE — Assessment & Plan Note (Addendum)
He finished Bactrim Mupirocin Rx Epsom salt compress

## 2020-09-05 NOTE — Progress Notes (Signed)
Subjective:  Patient ID: Adam Keller, male    DOB: 04-17-1956  Age: 65 y.o. MRN: [id number]  CC: No chief complaint on file.   HPI Adam Keller presents for cellulitis on the R thigh 1 wk ago - he took 7 d of Bactrim (last day yesterday). On Sunday she noticed that his upper lip was swollen and crusty.  Today it is feeling better (1 day after he stopped Bactrim).  He did not take any Benadryl or Claritin.  No skin rash, no mouth ulcers.  Outpatient Medications Prior to Visit  Medication Sig Dispense Refill  . colesevelam (WELCHOL) 625 MG tablet Take 625-1,250 mg by mouth daily as needed. For diarrhea     . lamoTRIgine (LAMICTAL) 200 MG tablet Take 1 tablet (200 mg total) by mouth at bedtime. 30 tablet 1  . levothyroxine (SYNTHROID) 50 MCG tablet TAKE 1 AND 1/2 TABLETS(75 MCG) BY MOUTH DAILY BEFORE BREAKFAST 135 tablet 3  . lithium carbonate 600 MG capsule Take 1 capsule (600 mg total) by mouth 2 (two) times daily with a meal. 60 capsule 1  . mometasone (NASONEX) 50 MCG/ACT nasal spray SHAKE LIQUID AND USE 2 SPRAYS IN EACH NOSTRIL DAILY 51 g 3  . montelukast (SINGULAIR) 10 MG tablet TAKE 1 TABLET BY MOUTH EVERY DAY 90 tablet 2  . OLANZapine (ZYPREXA) 7.5 MG tablet Take 7.5 mg by mouth in the morning and at bedtime.     Marland Kitchen omeprazole (PRILOSEC) 40 MG capsule Take 1 capsule (40 mg total) by mouth daily as needed. (Patient taking differently: Take 40 mg by mouth daily as needed (acid reflux). ) 30 capsule 2   No facility-administered medications prior to visit.    ROS: Review of Systems  Constitutional: Negative for appetite change, fatigue and unexpected weight change.  HENT: Negative for congestion, nosebleeds, sneezing, sore throat and trouble swallowing.   Eyes: Negative for itching and visual disturbance.  Respiratory: Negative for cough.   Cardiovascular: Negative for chest pain, palpitations and leg swelling.  Gastrointestinal: Negative for abdominal distention, blood in stool,  diarrhea and nausea.  Genitourinary: Negative for frequency and hematuria.  Musculoskeletal: Negative for back pain, gait problem, joint swelling and neck pain.  Skin: Positive for color change, rash and wound.  Neurological: Negative for dizziness, tremors, speech difficulty and weakness.  Psychiatric/Behavioral: Negative for agitation, dysphoric mood and sleep disturbance. The patient is not nervous/anxious.     Objective:  There were no vitals taken for this visit.  BP Readings from Last 3 Encounters:  06/13/20 120/78  02/01/20 120/78  12/06/19 112/72    Wt Readings from Last 3 Encounters:  06/13/20 166 lb (75.3 kg)  02/01/20 159 lb (72.1 kg)  12/05/19 159 lb 2.8 oz (72.2 kg)    Physical Exam Constitutional:      General: He is not in acute distress.    Appearance: Normal appearance. He is well-developed. He is not toxic-appearing.     Comments: NAD  HENT:     Mouth/Throat:     Mouth: Oropharynx is clear and moist.  Eyes:     Conjunctiva/sclera: Conjunctivae normal.     Pupils: Pupils are equal, round, and reactive to light.  Neck:     Thyroid: No thyromegaly.     Vascular: No JVD.  Cardiovascular:     Rate and Rhythm: Normal rate and regular rhythm.     Pulses: Intact distal pulses.     Heart sounds: Normal heart sounds. No murmur heard. No  friction rub. No gallop.   Pulmonary:     Effort: Pulmonary effort is normal. No respiratory distress.     Breath sounds: Normal breath sounds. No wheezing or rales.  Chest:     Chest wall: No tenderness.  Abdominal:     General: Bowel sounds are normal. There is no distension.     Palpations: Abdomen is soft. There is no mass.     Tenderness: There is no abdominal tenderness. There is no guarding or rebound.  Musculoskeletal:        General: No tenderness or edema. Normal range of motion.     Cervical back: Normal range of motion.  Lymphadenopathy:     Cervical: No cervical adenopathy.  Skin:    General: Skin is warm  and dry.     Findings: No rash.  Neurological:     Mental Status: He is alert and oriented to person, place, and time.     Cranial Nerves: No cranial nerve deficit.     Motor: No abnormal muscle tone.     Coordination: He displays a negative Romberg sign. Coordination normal.     Gait: Gait normal.     Deep Tendon Reflexes: Reflexes are normal and symmetric.  Psychiatric:        Mood and Affect: Mood and affect normal.        Behavior: Behavior normal.        Thought Content: Thought content normal.        Judgment: Judgment normal.   Upper lip is swollen with dark crusts in the middle.  Mild cavity is clear.  Right groin abscess is soft, no drainage, granulating tissue 1 x 2-1/2 cm  Lab Results  Component Value Date   WBC 4.9 06/13/2020   HGB 15.2 06/13/2020   HCT 43.3 06/13/2020   PLT 202.0 06/13/2020   GLUCOSE 93 06/13/2020   CHOL 204 (H) 12/03/2017   TRIG 35 12/03/2017   HDL 71 12/03/2017   LDLDIRECT 116.5 01/02/2011   LDLCALC 126 (H) 12/03/2017   ALT 9 06/13/2020   AST 22 06/13/2020   NA 139 06/13/2020   K 4.0 06/13/2020   CL 105 06/13/2020   CREATININE 0.93 06/13/2020   BUN 17 06/13/2020   CO2 30 06/13/2020   TSH 1.53 06/13/2020   PSA 5.20 (H) 05/08/2014   HGBA1C 4.7 (L) 12/03/2017    No results found.  Assessment & Plan:    Walker Kehr, MD

## 2020-09-05 NOTE — Assessment & Plan Note (Signed)
Due to Bactrim - better Triamc cream Benadryl

## 2020-09-05 NOTE — Patient Instructions (Addendum)
Epsom salt compress [place]

## 2020-09-05 NOTE — Assessment & Plan Note (Signed)
Pt stopped Bactrim Benadryl prn

## 2020-09-26 DIAGNOSIS — C61 Malignant neoplasm of prostate: Secondary | ICD-10-CM | POA: Diagnosis not present

## 2020-10-03 DIAGNOSIS — C61 Malignant neoplasm of prostate: Secondary | ICD-10-CM | POA: Diagnosis not present

## 2020-10-03 DIAGNOSIS — L02215 Cutaneous abscess of perineum: Secondary | ICD-10-CM | POA: Diagnosis not present

## 2020-10-04 DIAGNOSIS — C029 Malignant neoplasm of tongue, unspecified: Secondary | ICD-10-CM | POA: Diagnosis not present

## 2020-12-12 ENCOUNTER — Encounter: Payer: Self-pay | Admitting: Internal Medicine

## 2020-12-12 ENCOUNTER — Ambulatory Visit: Payer: Federal, State, Local not specified - PPO | Admitting: Internal Medicine

## 2020-12-12 ENCOUNTER — Other Ambulatory Visit: Payer: Self-pay

## 2020-12-12 ENCOUNTER — Ambulatory Visit (INDEPENDENT_AMBULATORY_CARE_PROVIDER_SITE_OTHER): Payer: Federal, State, Local not specified - PPO

## 2020-12-12 VITALS — BP 124/72 | HR 86 | Temp 98.4°F | Ht 72.0 in | Wt 168.6 lb

## 2020-12-12 DIAGNOSIS — E785 Hyperlipidemia, unspecified: Secondary | ICD-10-CM | POA: Diagnosis not present

## 2020-12-12 DIAGNOSIS — R7989 Other specified abnormal findings of blood chemistry: Secondary | ICD-10-CM | POA: Diagnosis not present

## 2020-12-12 DIAGNOSIS — C029 Malignant neoplasm of tongue, unspecified: Secondary | ICD-10-CM

## 2020-12-12 DIAGNOSIS — C61 Malignant neoplasm of prostate: Secondary | ICD-10-CM

## 2020-12-12 DIAGNOSIS — Z23 Encounter for immunization: Secondary | ICD-10-CM | POA: Diagnosis not present

## 2020-12-12 DIAGNOSIS — F319 Bipolar disorder, unspecified: Secondary | ICD-10-CM

## 2020-12-12 DIAGNOSIS — Z0000 Encounter for general adult medical examination without abnormal findings: Secondary | ICD-10-CM

## 2020-12-12 DIAGNOSIS — Z0389 Encounter for observation for other suspected diseases and conditions ruled out: Secondary | ICD-10-CM | POA: Diagnosis not present

## 2020-12-12 LAB — CBC WITH DIFFERENTIAL/PLATELET
Basophils Absolute: 0.1 10*3/uL (ref 0.0–0.1)
Basophils Relative: 1.5 % (ref 0.0–3.0)
Eosinophils Absolute: 0.2 10*3/uL (ref 0.0–0.7)
Eosinophils Relative: 3.7 % (ref 0.0–5.0)
HCT: 45 % (ref 39.0–52.0)
Hemoglobin: 15.9 g/dL (ref 13.0–17.0)
Lymphocytes Relative: 28.8 % (ref 12.0–46.0)
Lymphs Abs: 1.3 10*3/uL (ref 0.7–4.0)
MCHC: 35.3 g/dL (ref 30.0–36.0)
MCV: 92.5 fl (ref 78.0–100.0)
Monocytes Absolute: 0.3 10*3/uL (ref 0.1–1.0)
Monocytes Relative: 6.8 % (ref 3.0–12.0)
Neutro Abs: 2.6 10*3/uL (ref 1.4–7.7)
Neutrophils Relative %: 59.2 % (ref 43.0–77.0)
Platelets: 198 10*3/uL (ref 150.0–400.0)
RBC: 4.87 Mil/uL (ref 4.22–5.81)
RDW: 12.7 % (ref 11.5–15.5)
WBC: 4.4 10*3/uL (ref 4.0–10.5)

## 2020-12-12 LAB — T4, FREE: Free T4: 0.72 ng/dL (ref 0.60–1.60)

## 2020-12-12 LAB — AMMONIA: Ammonia: 57 umol/L — ABNORMAL HIGH (ref 11–35)

## 2020-12-12 LAB — TSH: TSH: 1.36 u[IU]/mL (ref 0.35–4.50)

## 2020-12-12 NOTE — Progress Notes (Signed)
Subjective:  Patient ID: Adam Keller, male    DOB: 03/17/1956  Age: 65 y.o. MRN: [id number]  CC: Follow-up (6 month f/u)   HPI Adam Keller presents for   Outpatient Medications Prior to Visit  Medication Sig Dispense Refill  . colesevelam (WELCHOL) 625 MG tablet Take 625-1,250 mg by mouth daily as needed. For diarrhea    . lamoTRIgine (LAMICTAL) 200 MG tablet Take 1 tablet (200 mg total) by mouth at bedtime. 30 tablet 1  . levothyroxine (SYNTHROID) 50 MCG tablet TAKE 1 AND 1/2 TABLETS(75 MCG) BY MOUTH DAILY BEFORE BREAKFAST 135 tablet 3  . lithium carbonate 600 MG capsule Take 1 capsule (600 mg total) by mouth 2 (two) times daily with a meal. 60 capsule 1  . mometasone (NASONEX) 50 MCG/ACT nasal spray SHAKE LIQUID AND USE 2 SPRAYS IN EACH NOSTRIL DAILY 51 g 3  . montelukast (SINGULAIR) 10 MG tablet TAKE 1 TABLET BY MOUTH EVERY DAY 90 tablet 2  . OLANZapine (ZYPREXA) 7.5 MG tablet Take 7.5 mg by mouth in the morning and at bedtime.     Marland Kitchen omeprazole (PRILOSEC) 40 MG capsule Take 1 capsule (40 mg total) by mouth daily as needed. (Patient taking differently: Take 40 mg by mouth daily as needed (acid reflux).) 30 capsule 2  . mupirocin ointment (BACTROBAN) 2 % On leg wound  Bid-qid 30 g 0  . triamcinolone cream (KENALOG) 0.5 % Apply 1 application topically 4 (four) times daily. On the lip rash 45 g 0   No facility-administered medications prior to visit.    ROS: Review of Systems  Constitutional: Negative for appetite change, fatigue and unexpected weight change.  HENT: Negative for congestion, nosebleeds, sneezing, sore throat and trouble swallowing.   Eyes: Negative for itching and visual disturbance.  Respiratory: Negative for cough.   Cardiovascular: Negative for chest pain, palpitations and leg swelling.  Gastrointestinal: Negative for abdominal distention, blood in stool, diarrhea and nausea.  Genitourinary: Negative for frequency and hematuria.  Musculoskeletal: Negative for  back pain, gait problem, joint swelling and neck pain.  Skin: Negative for rash.  Neurological: Negative for dizziness, tremors, speech difficulty and weakness.  Psychiatric/Behavioral: Negative for agitation, dysphoric mood and sleep disturbance. The patient is not nervous/anxious.     Objective:  BP 124/72 (BP Location: Left Arm)   Pulse 86   Temp 98.4 F (36.9 C) (Oral)   Ht 6' (1.829 m)   Wt 168 lb 9.6 oz (76.5 kg)   SpO2 95%   BMI 22.87 kg/m   BP Readings from Last 3 Encounters:  12/12/20 124/72  09/05/20 120/82  06/13/20 120/78    Wt Readings from Last 3 Encounters:  12/12/20 168 lb 9.6 oz (76.5 kg)  09/05/20 166 lb (75.3 kg)  06/13/20 166 lb (75.3 kg)    Physical Exam Constitutional:      General: He is not in acute distress.    Appearance: He is well-developed.     Comments: NAD  Eyes:     Conjunctiva/sclera: Conjunctivae normal.     Pupils: Pupils are equal, round, and reactive to light.  Neck:     Thyroid: No thyromegaly.     Vascular: No JVD.  Cardiovascular:     Rate and Rhythm: Normal rate and regular rhythm.     Heart sounds: Normal heart sounds. No murmur heard. No friction rub. No gallop.   Pulmonary:     Effort: Pulmonary effort is normal. No respiratory distress.  Breath sounds: Normal breath sounds. No wheezing or rales.  Chest:     Chest wall: No tenderness.  Abdominal:     General: Bowel sounds are normal. There is no distension.     Palpations: Abdomen is soft. There is no mass.     Tenderness: There is no abdominal tenderness. There is no guarding or rebound.  Musculoskeletal:        General: No tenderness. Normal range of motion.     Cervical back: Normal range of motion.  Lymphadenopathy:     Cervical: No cervical adenopathy.  Skin:    General: Skin is warm and dry.     Findings: No rash.  Neurological:     Mental Status: He is alert and oriented to person, place, and time.     Cranial Nerves: No cranial nerve deficit.      Motor: No abnormal muscle tone.     Coordination: Coordination normal.     Gait: Gait normal.     Deep Tendon Reflexes: Reflexes are normal and symmetric.  Psychiatric:        Behavior: Behavior normal.        Thought Content: Thought content normal.        Judgment: Judgment normal.     Lab Results  Component Value Date   WBC 4.9 06/13/2020   HGB 15.2 06/13/2020   HCT 43.3 06/13/2020   PLT 202.0 06/13/2020   GLUCOSE 93 06/13/2020   CHOL 204 (H) 12/03/2017   TRIG 35 12/03/2017   HDL 71 12/03/2017   LDLDIRECT 116.5 01/02/2011   LDLCALC 126 (H) 12/03/2017   ALT 9 06/13/2020   AST 22 06/13/2020   NA 139 06/13/2020   K 4.0 06/13/2020   CL 105 06/13/2020   CREATININE 0.93 06/13/2020   BUN 17 06/13/2020   CO2 30 06/13/2020   TSH 1.53 06/13/2020   PSA 5.20 (H) 05/08/2014   HGBA1C 4.7 (L) 12/03/2017    No results found.  Assessment & Plan:     Walker Kehr, MD

## 2020-12-12 NOTE — Patient Instructions (Signed)
Colonoscopy 2015; due in 2025 (hyperplsastic polyp)

## 2020-12-12 NOTE — Assessment & Plan Note (Signed)
Doing fair now

## 2020-12-12 NOTE — Assessment & Plan Note (Signed)
F/u w/Urology Incontinence is better

## 2020-12-12 NOTE — Addendum Note (Signed)
Addended by: Boris Lown B on: 12/12/2020 02:46 PM   Modules accepted: Orders

## 2020-12-12 NOTE — Assessment & Plan Note (Signed)
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

## 2020-12-12 NOTE — Addendum Note (Signed)
Addended by: Earnstine Regal on: 12/12/2020 02:51 PM   Modules accepted: Orders

## 2020-12-12 NOTE — Assessment & Plan Note (Signed)
On Dyslipidemia

## 2020-12-13 LAB — LITHIUM LEVEL: Lithium Lvl: 0.7 mmol/L (ref 0.6–1.2)

## 2020-12-14 LAB — COMPREHENSIVE METABOLIC PANEL
ALT: 12 U/L (ref 0–53)
AST: 19 U/L (ref 0–37)
Albumin: 4.9 g/dL (ref 3.5–5.2)
Alkaline Phosphatase: 68 U/L (ref 39–117)
BUN: 19 mg/dL (ref 6–23)
CO2: 19 mEq/L (ref 19–32)
Calcium: 10.3 mg/dL (ref 8.4–10.5)
Chloride: 106 mEq/L (ref 96–112)
Creatinine, Ser: 1.05 mg/dL (ref 0.40–1.50)
GFR: 75.46 mL/min (ref >60.00)
Glucose, Bld: 89 mg/dL (ref 70–99)
Potassium: 4.6 mEq/L (ref 3.5–5.1)
Sodium: 143 mEq/L (ref 135–145)
Total Bilirubin: 0.7 mg/dL (ref 0.2–1.2)
Total Protein: 7.2 g/dL (ref 6.0–8.3)

## 2021-02-08 DIAGNOSIS — F3174 Bipolar disorder, in full remission, most recent episode manic: Secondary | ICD-10-CM | POA: Diagnosis not present

## 2021-02-08 DIAGNOSIS — F5101 Primary insomnia: Secondary | ICD-10-CM | POA: Diagnosis not present

## 2021-03-07 DIAGNOSIS — C029 Malignant neoplasm of tongue, unspecified: Secondary | ICD-10-CM | POA: Diagnosis not present

## 2021-04-15 ENCOUNTER — Other Ambulatory Visit: Payer: Self-pay

## 2021-04-15 ENCOUNTER — Ambulatory Visit: Payer: Federal, State, Local not specified - PPO | Admitting: Internal Medicine

## 2021-04-15 ENCOUNTER — Encounter: Payer: Self-pay | Admitting: Internal Medicine

## 2021-04-15 DIAGNOSIS — C61 Malignant neoplasm of prostate: Secondary | ICD-10-CM | POA: Diagnosis not present

## 2021-04-15 DIAGNOSIS — F319 Bipolar disorder, unspecified: Secondary | ICD-10-CM | POA: Diagnosis not present

## 2021-04-15 DIAGNOSIS — R7989 Other specified abnormal findings of blood chemistry: Secondary | ICD-10-CM

## 2021-04-15 DIAGNOSIS — Z23 Encounter for immunization: Secondary | ICD-10-CM

## 2021-04-15 DIAGNOSIS — E039 Hypothyroidism, unspecified: Secondary | ICD-10-CM

## 2021-04-15 LAB — PSA: PSA: 0 ng/mL — ABNORMAL LOW (ref 0.10–4.00)

## 2021-04-15 LAB — COMPREHENSIVE METABOLIC PANEL
ALT: 10 U/L (ref 0–53)
AST: 21 U/L (ref 0–37)
Albumin: 4.7 g/dL (ref 3.5–5.2)
Alkaline Phosphatase: 60 U/L (ref 39–117)
BUN: 9 mg/dL (ref 6–23)
CO2: 25 mEq/L (ref 19–32)
Calcium: 9.8 mg/dL (ref 8.4–10.5)
Chloride: 106 mEq/L (ref 96–112)
Creatinine, Ser: 0.95 mg/dL (ref 0.40–1.50)
GFR: 84.88 mL/min (ref >60.00)
Glucose, Bld: 99 mg/dL (ref 70–99)
Potassium: 4.5 mEq/L (ref 3.5–5.1)
Sodium: 138 mEq/L (ref 135–145)
Total Bilirubin: 0.6 mg/dL (ref 0.2–1.2)
Total Protein: 6.4 g/dL (ref 6.0–8.3)

## 2021-04-15 LAB — AMMONIA: Ammonia: 34 umol/L (ref 11–35)

## 2021-04-15 NOTE — Assessment & Plan Note (Signed)
Post-op incontinence Check PSA

## 2021-04-15 NOTE — Addendum Note (Signed)
Addended by: Thomes Cake on: 04/15/2021 02:25 PM   Modules accepted: Orders

## 2021-04-15 NOTE — Progress Notes (Signed)
Subjective:  Patient ID: Adam Keller, male    DOB: October 13, 1955  Age: 65 y.o. MRN: [id number]  CC: 4 month f/u (No concerns. )   HPI Abron Neddo Makki presents for high ammonia, prostate cancer, tongue cancer, bipolar disorder f/u  Outpatient Medications Prior to Visit  Medication Sig Dispense Refill   colesevelam (WELCHOL) 625 MG tablet Take 625-1,250 mg by mouth daily as needed. For diarrhea     lamoTRIgine (LAMICTAL) 200 MG tablet Take 1 tablet (200 mg total) by mouth at bedtime. 30 tablet 1   levothyroxine (SYNTHROID) 50 MCG tablet TAKE 1 AND 1/2 TABLETS(75 MCG) BY MOUTH DAILY BEFORE BREAKFAST 135 tablet 3   lithium carbonate 600 MG capsule Take 1 capsule (600 mg total) by mouth 2 (two) times daily with a meal. 60 capsule 1   mometasone (NASONEX) 50 MCG/ACT nasal spray SHAKE LIQUID AND USE 2 SPRAYS IN EACH NOSTRIL DAILY 51 g 3   montelukast (SINGULAIR) 10 MG tablet TAKE 1 TABLET BY MOUTH EVERY DAY 90 tablet 2   OLANZapine (ZYPREXA) 7.5 MG tablet Take 7.5 mg by mouth in the morning and at bedtime.      omeprazole (PRILOSEC) 40 MG capsule Take 1 capsule (40 mg total) by mouth daily as needed. (Patient taking differently: Take 40 mg by mouth daily as needed (acid reflux).) 30 capsule 2   No facility-administered medications prior to visit.    ROS: Review of Systems  Constitutional:  Negative for appetite change, fatigue and unexpected weight change.  HENT:  Negative for congestion, nosebleeds, sneezing, sore throat and trouble swallowing.   Eyes:  Negative for itching and visual disturbance.  Respiratory:  Negative for cough.   Cardiovascular:  Negative for chest pain, palpitations and leg swelling.  Gastrointestinal:  Negative for abdominal distention, blood in stool, diarrhea and nausea.  Genitourinary:  Negative for frequency and hematuria.  Musculoskeletal:  Negative for back pain, gait problem, joint swelling and neck pain.  Skin:  Negative for rash.  Neurological:  Negative for  dizziness, tremors, speech difficulty and weakness.  Psychiatric/Behavioral:  Negative for agitation, dysphoric mood, sleep disturbance and suicidal ideas. The patient is not nervous/anxious.    Objective:  BP 122/80   Pulse 79   Temp 98.1 F (36.7 C) (Oral)   Resp 18   Ht 6' (1.829 m)   Wt 172 lb 3.2 oz (78.1 kg)   SpO2 96%   BMI 23.35 kg/m   BP Readings from Last 3 Encounters:  04/15/21 122/80  12/12/20 124/72  09/05/20 120/82    Wt Readings from Last 3 Encounters:  04/15/21 172 lb 3.2 oz (78.1 kg)  12/12/20 168 lb 9.6 oz (76.5 kg)  09/05/20 166 lb (75.3 kg)    Physical Exam Constitutional:      General: He is not in acute distress.    Appearance: Normal appearance. He is well-developed.     Comments: NAD  Eyes:     Conjunctiva/sclera: Conjunctivae normal.     Pupils: Pupils are equal, round, and reactive to light.  Neck:     Thyroid: No thyromegaly.     Vascular: No JVD.  Cardiovascular:     Rate and Rhythm: Normal rate and regular rhythm.     Heart sounds: Normal heart sounds. No murmur heard.   No friction rub. No gallop.  Pulmonary:     Effort: Pulmonary effort is normal. No respiratory distress.     Breath sounds: Normal breath sounds. No wheezing or rales.  Chest:     Chest wall: No tenderness.  Abdominal:     General: Bowel sounds are normal. There is no distension.     Palpations: Abdomen is soft. There is no mass.     Tenderness: There is no abdominal tenderness. There is no guarding or rebound.  Musculoskeletal:        General: No tenderness. Normal range of motion.     Cervical back: Normal range of motion.  Lymphadenopathy:     Cervical: No cervical adenopathy.  Skin:    General: Skin is warm and dry.     Findings: No rash.  Neurological:     Mental Status: He is alert and oriented to person, place, and time.     Cranial Nerves: No cranial nerve deficit.     Motor: No abnormal muscle tone.     Coordination: Coordination normal.     Gait:  Gait normal.     Deep Tendon Reflexes: Reflexes are normal and symmetric.  Psychiatric:        Behavior: Behavior normal.        Thought Content: Thought content normal.        Judgment: Judgment normal.    Lab Results  Component Value Date   WBC 4.4 12/12/2020   HGB 15.9 12/12/2020   HCT 45.0 12/12/2020   PLT 198.0 12/12/2020   GLUCOSE 89 12/12/2020   CHOL 204 (H) 12/03/2017   TRIG 35 12/03/2017   HDL 71 12/03/2017   LDLDIRECT 116.5 01/02/2011   LDLCALC 126 (H) 12/03/2017   ALT 12 12/12/2020   AST 19 12/12/2020   NA 143 12/12/2020   K 4.6 12/12/2020   CL 106 12/12/2020   CREATININE 1.05 12/12/2020   BUN 19 12/12/2020   CO2 19 12/12/2020   TSH 1.36 12/12/2020   PSA 5.20 (H) 05/08/2014   HGBA1C 4.7 (L) 12/03/2017    No results found.  Assessment & Plan:   Problem List Items Addressed This Visit     Bipolar depression ([place])    Bipolar disorder - in remission        Relevant Orders   Lithium level   Hypothyroidism    On Levothyroid Monitor TSH      Relevant Orders   Comprehensive metabolic panel   Increased ammonia level    It is likely a side effect of lithium therapy. Check lithium level, ammonia      Relevant Orders   Ammonia   Prostate cancer ([place])    Post-op incontinence Check PSA      Relevant Orders   PSA      Follow-up: Return in about 3 months (around 07/16/2021) for a follow-up visit.  Walker Kehr, MD

## 2021-04-15 NOTE — Assessment & Plan Note (Signed)
On Levothyroid Monitor TSH

## 2021-04-15 NOTE — Addendum Note (Signed)
Addended by: Jacobo Forest on: 04/15/2021 02:27 PM   Modules accepted: Orders

## 2021-04-15 NOTE — Assessment & Plan Note (Signed)
Bipolar disorder - in remission

## 2021-04-15 NOTE — Assessment & Plan Note (Signed)
It is likely a side effect of lithium therapy. Check lithium level, ammonia

## 2021-04-16 LAB — LITHIUM LEVEL: Lithium Lvl: 0.4 mmol/L — ABNORMAL LOW (ref 0.6–1.2)

## 2021-05-09 ENCOUNTER — Other Ambulatory Visit: Payer: Self-pay | Admitting: Internal Medicine

## 2021-05-14 DIAGNOSIS — H04123 Dry eye syndrome of bilateral lacrimal glands: Secondary | ICD-10-CM | POA: Diagnosis not present

## 2021-05-30 DIAGNOSIS — C029 Malignant neoplasm of tongue, unspecified: Secondary | ICD-10-CM | POA: Diagnosis not present

## 2021-06-02 ENCOUNTER — Other Ambulatory Visit: Payer: Self-pay | Admitting: Internal Medicine

## 2021-06-05 DIAGNOSIS — C61 Malignant neoplasm of prostate: Secondary | ICD-10-CM | POA: Diagnosis not present

## 2021-06-12 DIAGNOSIS — C61 Malignant neoplasm of prostate: Secondary | ICD-10-CM | POA: Diagnosis not present

## 2021-07-17 ENCOUNTER — Other Ambulatory Visit: Payer: Self-pay

## 2021-07-17 ENCOUNTER — Ambulatory Visit: Payer: Federal, State, Local not specified - PPO | Admitting: Internal Medicine

## 2021-07-17 ENCOUNTER — Encounter: Payer: Self-pay | Admitting: Internal Medicine

## 2021-07-17 VITALS — BP 118/79 | HR 76 | Temp 98.2°F | Ht 72.0 in | Wt 177.0 lb

## 2021-07-17 DIAGNOSIS — E039 Hypothyroidism, unspecified: Secondary | ICD-10-CM | POA: Diagnosis not present

## 2021-07-17 DIAGNOSIS — R7989 Other specified abnormal findings of blood chemistry: Secondary | ICD-10-CM

## 2021-07-17 DIAGNOSIS — F319 Bipolar disorder, unspecified: Secondary | ICD-10-CM | POA: Diagnosis not present

## 2021-07-17 LAB — COMPREHENSIVE METABOLIC PANEL
ALT: 25 U/L (ref 0–53)
AST: 31 U/L (ref 0–37)
Albumin: 4.6 g/dL (ref 3.5–5.2)
Alkaline Phosphatase: 70 U/L (ref 39–117)
BUN: 14 mg/dL (ref 6–23)
CO2: 26 mEq/L (ref 19–32)
Calcium: 10 mg/dL (ref 8.4–10.5)
Chloride: 106 mEq/L (ref 96–112)
Creatinine, Ser: 0.95 mg/dL (ref 0.40–1.50)
GFR: 84.73 mL/min (ref >60.00)
Glucose, Bld: 98 mg/dL (ref 70–99)
Potassium: 4.2 mEq/L (ref 3.5–5.1)
Sodium: 138 mEq/L (ref 135–145)
Total Bilirubin: 0.7 mg/dL (ref 0.2–1.2)
Total Protein: 6.9 g/dL (ref 6.0–8.3)

## 2021-07-17 NOTE — Progress Notes (Signed)
Subjective:  Patient ID: Adam Keller, male    DOB: Mar 10, 1956  Age: 66 y.o. MRN: [id number]  CC: Follow-up (3 month f/u)   HPI Adam Keller presents for hypothyroidism, high ammonia, bipolar disorder  Outpatient Medications Prior to Visit  Medication Sig Dispense Refill   colesevelam (WELCHOL) 625 MG tablet Take 625-1,250 mg by mouth daily as needed. For diarrhea     lamoTRIgine (LAMICTAL) 200 MG tablet Take 1 tablet (200 mg total) by mouth at bedtime. 30 tablet 1   levothyroxine (SYNTHROID) 50 MCG tablet TAKE 1 AND 1/2 TABLETS(75 MCG) BY MOUTH DAILY BEFORE BREAKFAST 135 tablet 3   lithium carbonate 600 MG capsule Take 1 capsule (600 mg total) by mouth 2 (two) times daily with a meal. 60 capsule 1   mometasone (NASONEX) 50 MCG/ACT nasal spray SHAKE LIQUID AND USE 2 SPRAYS IN EACH NOSTRIL DAILY 51 g 3   montelukast (SINGULAIR) 10 MG tablet TAKE 1 TABLET BY MOUTH EVERY DAY 90 tablet 2   OLANZapine (ZYPREXA) 7.5 MG tablet Take 7.5 mg by mouth in the morning and at bedtime.      omeprazole (PRILOSEC) 40 MG capsule Take 1 capsule (40 mg total) by mouth daily as needed. (Patient taking differently: Take 40 mg by mouth daily as needed (acid reflux).) 30 capsule 2   No facility-administered medications prior to visit.    ROS: Review of Systems  Constitutional:  Positive for unexpected weight change. Negative for appetite change and fatigue.  HENT:  Negative for congestion, nosebleeds, sneezing, sore throat and trouble swallowing.   Eyes:  Negative for itching and visual disturbance.  Respiratory:  Negative for cough.   Cardiovascular:  Negative for chest pain, palpitations and leg swelling.  Gastrointestinal:  Negative for abdominal distention, blood in stool, diarrhea and nausea.  Genitourinary:  Negative for frequency and hematuria.  Musculoskeletal:  Negative for back pain, gait problem, joint swelling and neck pain.  Skin:  Negative for rash.  Neurological:  Negative for dizziness,  tremors, speech difficulty and weakness.  Psychiatric/Behavioral:  Negative for agitation, dysphoric mood and sleep disturbance. The patient is not nervous/anxious.    Objective:  BP 118/79 (BP Location: Left Arm)    Pulse 76    Temp 98.2 F (36.8 C) (Oral)    Ht 6' (1.829 m)    Wt 177 lb (80.3 kg)    SpO2 96%    BMI 24.01 kg/m   BP Readings from Last 3 Encounters:  07/17/21 118/79  04/15/21 122/80  12/12/20 124/72    Wt Readings from Last 3 Encounters:  07/17/21 177 lb (80.3 kg)  04/15/21 172 lb 3.2 oz (78.1 kg)  12/12/20 168 lb 9.6 oz (76.5 kg)    Physical Exam Constitutional:      General: He is not in acute distress.    Appearance: He is well-developed.     Comments: NAD  Eyes:     Conjunctiva/sclera: Conjunctivae normal.     Pupils: Pupils are equal, round, and reactive to light.  Neck:     Thyroid: No thyromegaly.     Vascular: No JVD.  Cardiovascular:     Rate and Rhythm: Normal rate and regular rhythm.     Heart sounds: Normal heart sounds. No murmur heard.   No friction rub. No gallop.  Pulmonary:     Effort: Pulmonary effort is normal. No respiratory distress.     Breath sounds: Normal breath sounds. No wheezing or rales.  Chest:  Chest wall: No tenderness.  Abdominal:     General: Bowel sounds are normal. There is no distension.     Palpations: Abdomen is soft. There is no mass.     Tenderness: There is no abdominal tenderness. There is no guarding or rebound.  Musculoskeletal:        General: No tenderness. Normal range of motion.     Cervical back: Normal range of motion.  Lymphadenopathy:     Cervical: No cervical adenopathy.  Skin:    General: Skin is warm and dry.     Findings: No rash.  Neurological:     Mental Status: He is alert and oriented to person, place, and time.     Cranial Nerves: No cranial nerve deficit.     Motor: No abnormal muscle tone.     Coordination: Coordination normal.     Gait: Gait normal.     Deep Tendon Reflexes:  Reflexes are normal and symmetric.  Psychiatric:        Behavior: Behavior normal.        Thought Content: Thought content normal.        Judgment: Judgment normal.    Lab Results  Component Value Date   WBC 4.4 12/12/2020   HGB 15.9 12/12/2020   HCT 45.0 12/12/2020   PLT 198.0 12/12/2020   GLUCOSE 99 04/15/2021   CHOL 204 (H) 12/03/2017   TRIG 35 12/03/2017   HDL 71 12/03/2017   LDLDIRECT 116.5 01/02/2011   LDLCALC 126 (H) 12/03/2017   ALT 10 04/15/2021   AST 21 04/15/2021   NA 138 04/15/2021   K 4.5 04/15/2021   CL 106 04/15/2021   CREATININE 0.95 04/15/2021   BUN 9 04/15/2021   CO2 25 04/15/2021   TSH 1.36 12/12/2020   PSA 0.00 Repeated and verified X2. (L) 04/15/2021   HGBA1C 4.7 (L) 12/03/2017    No results found.  Assessment & Plan:   Problem List Items Addressed This Visit     Bipolar depression ([place])    Check ammonia, lithium levels. On Lamictal, Zyprexa, Lithium      Relevant Orders   Ammonia   Comprehensive metabolic panel   Lithium level   Hypothyroidism    Check TSH      Relevant Orders   Ammonia   Comprehensive metabolic panel   Increased ammonia level - Primary     It is likely a side effect of lithium therapy. Check lithium level, ammonia On Lamictal, Zyprexa, Lithium      Relevant Orders   Lithium level      No orders of the defined types were placed in this encounter.     Follow-up: No follow-ups on file.  Walker Kehr, MD

## 2021-07-17 NOTE — Assessment & Plan Note (Signed)
Check TSH 

## 2021-07-17 NOTE — Assessment & Plan Note (Addendum)
°  It is likely a side effect of lithium therapy. Check lithium level, ammonia On Lamictal, Zyprexa, Lithium

## 2021-07-17 NOTE — Assessment & Plan Note (Signed)
Check ammonia, lithium levels. On Lamictal, Zyprexa, Lithium

## 2021-07-18 LAB — LITHIUM LEVEL: Lithium Lvl: 0.5 mmol/L — ABNORMAL LOW (ref 0.6–1.2)

## 2021-07-18 LAB — AMMONIA: Ammonia: 47 umol/L — ABNORMAL HIGH (ref 11–35)

## 2021-07-18 NOTE — Addendum Note (Signed)
Addended by: Jacobo Forest on: 07/18/2021 02:25 PM   Modules accepted: Orders

## 2021-08-29 DIAGNOSIS — C029 Malignant neoplasm of tongue, unspecified: Secondary | ICD-10-CM | POA: Diagnosis not present

## 2021-09-02 DIAGNOSIS — F3174 Bipolar disorder, in full remission, most recent episode manic: Secondary | ICD-10-CM | POA: Diagnosis not present

## 2021-10-16 ENCOUNTER — Encounter: Payer: Self-pay | Admitting: Internal Medicine

## 2021-10-16 ENCOUNTER — Ambulatory Visit: Payer: Federal, State, Local not specified - PPO | Admitting: Internal Medicine

## 2021-10-16 DIAGNOSIS — Z136 Encounter for screening for cardiovascular disorders: Secondary | ICD-10-CM | POA: Diagnosis not present

## 2021-10-16 DIAGNOSIS — E785 Hyperlipidemia, unspecified: Secondary | ICD-10-CM

## 2021-10-16 DIAGNOSIS — E039 Hypothyroidism, unspecified: Secondary | ICD-10-CM | POA: Diagnosis not present

## 2021-10-16 DIAGNOSIS — R7989 Other specified abnormal findings of blood chemistry: Secondary | ICD-10-CM

## 2021-10-16 DIAGNOSIS — F319 Bipolar disorder, unspecified: Secondary | ICD-10-CM

## 2021-10-16 LAB — CBC WITH DIFFERENTIAL/PLATELET
Basophils Absolute: 0 10*3/uL (ref 0.0–0.1)
Basophils Relative: 1.1 % (ref 0.0–3.0)
Eosinophils Absolute: 0.2 10*3/uL (ref 0.0–0.7)
Eosinophils Relative: 5.9 % — ABNORMAL HIGH (ref 0.0–5.0)
HCT: 44.9 % (ref 39.0–52.0)
Hemoglobin: 15.7 g/dL (ref 13.0–17.0)
Lymphocytes Relative: 29.1 % (ref 12.0–46.0)
Lymphs Abs: 1.2 10*3/uL (ref 0.7–4.0)
MCHC: 34.9 g/dL (ref 30.0–36.0)
MCV: 94.1 fl (ref 78.0–100.0)
Monocytes Absolute: 0.4 10*3/uL (ref 0.1–1.0)
Monocytes Relative: 8.6 % (ref 3.0–12.0)
Neutro Abs: 2.3 10*3/uL (ref 1.4–7.7)
Neutrophils Relative %: 55.3 % (ref 43.0–77.0)
Platelets: 185 10*3/uL (ref 150.0–400.0)
RBC: 4.77 Mil/uL (ref 4.22–5.81)
RDW: 12.7 % (ref 11.5–15.5)
WBC: 4.2 10*3/uL (ref 4.0–10.5)

## 2021-10-16 LAB — LIPID PANEL
Cholesterol: 213 mg/dL — ABNORMAL HIGH (ref 0–200)
HDL: 44.4 mg/dL (ref >39.00)
LDL Cholesterol: 147 mg/dL — ABNORMAL HIGH (ref 0–99)
NonHDL: 168.14
Total CHOL/HDL Ratio: 5
Triglycerides: 105 mg/dL (ref 0.0–149.0)
VLDL: 21 mg/dL (ref 0.0–40.0)

## 2021-10-16 LAB — COMPREHENSIVE METABOLIC PANEL
ALT: 13 U/L (ref 0–53)
AST: 21 U/L (ref 0–37)
Albumin: 4.9 g/dL (ref 3.5–5.2)
Alkaline Phosphatase: 61 U/L (ref 39–117)
BUN: 16 mg/dL (ref 6–23)
CO2: 25 mEq/L (ref 19–32)
Calcium: 9.9 mg/dL (ref 8.4–10.5)
Chloride: 106 mEq/L (ref 96–112)
Creatinine, Ser: 1.06 mg/dL (ref 0.40–1.50)
GFR: 74.16 mL/min (ref >60.00)
Glucose, Bld: 98 mg/dL (ref 70–99)
Potassium: 4.2 mEq/L (ref 3.5–5.1)
Sodium: 138 mEq/L (ref 135–145)
Total Bilirubin: 0.8 mg/dL (ref 0.2–1.2)
Total Protein: 6.8 g/dL (ref 6.0–8.3)

## 2021-10-16 LAB — T4, FREE: Free T4: 0.85 ng/dL (ref 0.60–1.60)

## 2021-10-16 LAB — TSH: TSH: 1.14 u[IU]/mL (ref 0.35–5.50)

## 2021-10-16 LAB — AMMONIA: Ammonia: 40 umol/L — ABNORMAL HIGH (ref 11–35)

## 2021-10-16 NOTE — Assessment & Plan Note (Signed)
Doing fair now. On Lamictal, Zyprexa, Lithium. Getting married on November 02, 2021 ?

## 2021-10-16 NOTE — Assessment & Plan Note (Signed)
Check lipids 

## 2021-10-16 NOTE — Assessment & Plan Note (Signed)
Cont on Levothroid 

## 2021-10-16 NOTE — Progress Notes (Signed)
? ?Subjective:  ?Patient ID: Adam Keller, male    DOB: 10-Nov-1955  Age: 66 y.o. MRN: [id number] ? ?CC: Follow-up (No concerns) ? ? ?HPI ?Adam Keller presents for hypothyroidism, bipolar disorder, dyslipidemia ?Adam Keller is getting married on November 02, 2021 ? ?Outpatient Medications Prior to Visit  ?Medication Sig Dispense Refill  ? colesevelam (WELCHOL) 625 MG tablet Take 625-1,250 mg by mouth daily as needed. For diarrhea    ? lamoTRIgine (LAMICTAL) 200 MG tablet Take 1 tablet (200 mg total) by mouth at bedtime. 30 tablet 1  ? levothyroxine (SYNTHROID) 50 MCG tablet TAKE 1 AND 1/2 TABLETS(75 MCG) BY MOUTH DAILY BEFORE BREAKFAST 135 tablet 3  ? lithium carbonate 600 MG capsule Take 1 capsule (600 mg total) by mouth 2 (two) times daily with a meal. 60 capsule 1  ? mometasone (NASONEX) 50 MCG/ACT nasal spray SHAKE LIQUID AND USE 2 SPRAYS IN EACH NOSTRIL DAILY 51 g 3  ? montelukast (SINGULAIR) 10 MG tablet TAKE 1 TABLET BY MOUTH EVERY DAY 90 tablet 2  ? OLANZapine (ZYPREXA) 7.5 MG tablet Take 7.5 mg by mouth in the morning and at bedtime.     ? omeprazole (PRILOSEC) 40 MG capsule Take 1 capsule (40 mg total) by mouth daily as needed. (Patient taking differently: Take 40 mg by mouth daily as needed (acid reflux).) 30 capsule 2  ? ?No facility-administered medications prior to visit.  ? ? ?ROS: ?Review of Systems  ?Constitutional:  Negative for appetite change, fatigue and unexpected weight change.  ?HENT:  Negative for congestion, nosebleeds, sneezing, sore throat and trouble swallowing.   ?Eyes:  Negative for itching and visual disturbance.  ?Respiratory:  Negative for cough.   ?Cardiovascular:  Negative for chest pain, palpitations and leg swelling.  ?Gastrointestinal:  Negative for abdominal distention, blood in stool, diarrhea and nausea.  ?Genitourinary:  Negative for frequency and hematuria.  ?Musculoskeletal:  Negative for back pain, gait problem, joint swelling and neck pain.  ?Skin:  Negative for rash.   ?Neurological:  Negative for dizziness, tremors, speech difficulty and weakness.  ?Psychiatric/Behavioral:  Negative for agitation, dysphoric mood and sleep disturbance. The patient is not nervous/anxious.   ? ?Objective:  ?BP 110/76   Pulse 85   Temp 98.4 ?F (36.9 ?C) (Oral)   Ht 6' (1.829 m)   Wt 177 lb 6 oz (80.5 kg)   SpO2 97%   BMI 24.06 kg/m?  ? ?BP Readings from Last 3 Encounters:  ?10/16/21 110/76  ?07/17/21 118/79  ?04/15/21 122/80  ? ? ?Wt Readings from Last 3 Encounters:  ?10/16/21 177 lb 6 oz (80.5 kg)  ?07/17/21 177 lb (80.3 kg)  ?04/15/21 172 lb 3.2 oz (78.1 kg)  ? ? ?Physical Exam ?Constitutional:   ?   General: He is not in acute distress. ?   Appearance: He is well-developed.  ?   Comments: NAD  ?Eyes:  ?   Conjunctiva/sclera: Conjunctivae normal.  ?   Pupils: Pupils are equal, round, and reactive to light.  ?Neck:  ?   Thyroid: No thyromegaly.  ?   Vascular: No JVD.  ?Cardiovascular:  ?   Rate and Rhythm: Normal rate and regular rhythm.  ?   Heart sounds: Normal heart sounds. No murmur heard. ?  No friction rub. No gallop.  ?Pulmonary:  ?   Effort: Pulmonary effort is normal. No respiratory distress.  ?   Breath sounds: Normal breath sounds. No wheezing or rales.  ?Chest:  ?   Chest wall: No  tenderness.  ?Abdominal:  ?   General: Bowel sounds are normal. There is no distension.  ?   Palpations: Abdomen is soft. There is no mass.  ?   Tenderness: There is no abdominal tenderness. There is no guarding or rebound.  ?Musculoskeletal:     ?   General: No tenderness. Normal range of motion.  ?   Cervical back: Normal range of motion.  ?Lymphadenopathy:  ?   Cervical: No cervical adenopathy.  ?Skin: ?   General: Skin is warm and dry.  ?   Findings: No rash.  ?Neurological:  ?   Mental Status: He is alert and oriented to person, place, and time.  ?   Cranial Nerves: No cranial nerve deficit.  ?   Motor: No abnormal muscle tone.  ?   Coordination: Coordination normal.  ?   Gait: Gait normal.  ?    Deep Tendon Reflexes: Reflexes are normal and symmetric.  ?Psychiatric:     ?   Behavior: Behavior normal.     ?   Thought Content: Thought content normal.     ?   Judgment: Judgment normal.  ? ? ?Lab Results  ?Component Value Date  ? WBC 4.4 12/12/2020  ? HGB 15.9 12/12/2020  ? HCT 45.0 12/12/2020  ? PLT 198.0 12/12/2020  ? GLUCOSE 98 07/17/2021  ? CHOL 204 (H) 12/03/2017  ? TRIG 35 12/03/2017  ? HDL 71 12/03/2017  ? LDLDIRECT 116.5 01/02/2011  ? LDLCALC 126 (H) 12/03/2017  ? ALT 25 07/17/2021  ? AST 31 07/17/2021  ? NA 138 07/17/2021  ? K 4.2 07/17/2021  ? CL 106 07/17/2021  ? CREATININE 0.95 07/17/2021  ? BUN 14 07/17/2021  ? CO2 26 07/17/2021  ? TSH 1.36 12/12/2020  ? PSA 0.00 Repeated and verified X2. (L) 04/15/2021  ? HGBA1C 4.7 (L) 12/03/2017  ? ? ?No results found. ? ?Assessment & Plan:  ? ?Problem List Items Addressed This Visit   ? ? Hypothyroidism  ?  Cont on Levothroid ?  ?  ? Relevant Orders  ? CBC with Differential/Platelet  ? Comprehensive metabolic panel  ? Lipid panel  ? Ammonia  ? T4, free  ? TSH  ? Lithium level  ? Dyslipidemia  ?  Check lipids ?  ?  ? Relevant Orders  ? CBC with Differential/Platelet  ? Comprehensive metabolic panel  ? Lipid panel  ? Ammonia  ? T4, free  ? TSH  ? Lithium level  ? Bipolar depression ([place])  ?  Doing fair now. On Lamictal, Zyprexa, Lithium. Getting married on November 02, 2021 ?  ?  ? Relevant Orders  ? CBC with Differential/Platelet  ? Comprehensive metabolic panel  ? Lipid panel  ? Ammonia  ? T4, free  ? TSH  ? Lithium level  ? Increased ammonia level  ?  ? ? ?No orders of the defined types were placed in this encounter. ?  ? ? ?Follow-up: Return in about 3 months (around 01/15/2022) for a follow-up visit. ? ?Walker Kehr, MD ?

## 2021-10-17 LAB — LITHIUM LEVEL: Lithium Lvl: 0.5 mmol/L — ABNORMAL LOW (ref 0.6–1.2)

## 2021-11-08 DIAGNOSIS — C61 Malignant neoplasm of prostate: Secondary | ICD-10-CM | POA: Diagnosis not present

## 2021-11-15 DIAGNOSIS — N5201 Erectile dysfunction due to arterial insufficiency: Secondary | ICD-10-CM | POA: Diagnosis not present

## 2021-11-15 DIAGNOSIS — C61 Malignant neoplasm of prostate: Secondary | ICD-10-CM | POA: Diagnosis not present

## 2021-11-22 IMAGING — MR MR PROSTATE WO/W CM
56 series · 56 of 56 positions shown · IV contrast (15ml Multihance)
Comparison: None.

EXAM:
MR PROSTATE WITHOUT AND WITH CONTRAST
TECHNIQUE: Multiplanar multisequence MRI images were obtained of the pelvis
centered about the prostate. Pre and post contrast images were
obtained.

CONTRAST:  15mL MULTIHANCE GADOBENATE DIMEGLUMINE 529 MG/ML IV SOLN

[Series 3: T1 · axial · 8.0mm · 1.06mm/px · 1 of 28 slices shown (1 of 2)]
[im 1/28]
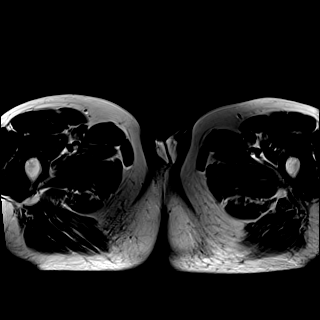

[Series 4: bSSFP fat-sat · axial · 8.0mm · 0.74mm/px · 1 of 28 slices shown]
[im 1/28]
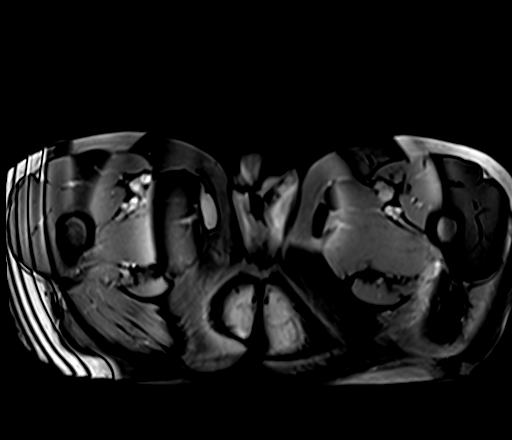

[Series 5: T2 · sagittal · 3.5mm · 0.56mm/px · 1 of 39 slices shown (1 of 4)]
[im 1/39]
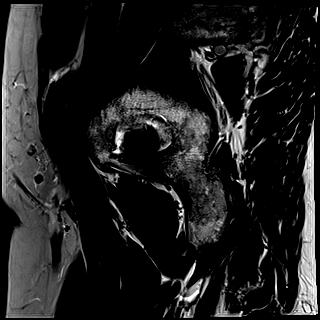

[Series 6: T2 · coronal · 3.5mm · 0.56mm/px · 1 of 23 slices shown (2 of 4)]
[im 1/23]
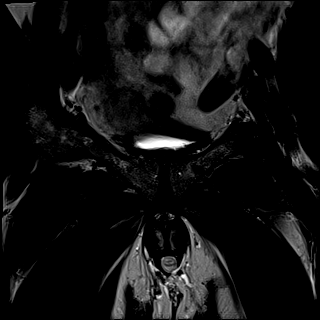

[Series 7: DWI · axial · 3.5mm · 1.56mm/px · 1 of 72 slices shown (1 of 2)]
[im 1/72]
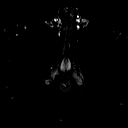

[Series 8: DWI · axial · 3.5mm · 1.56mm/px · 1 of 24 slices shown (2 of 2)]
[im 1/24]
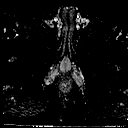

[Series 9: T1 · axial · 3.0mm · 0.31mm/px · 1 of 24 slices shown (2 of 2)]
[im 1/24]
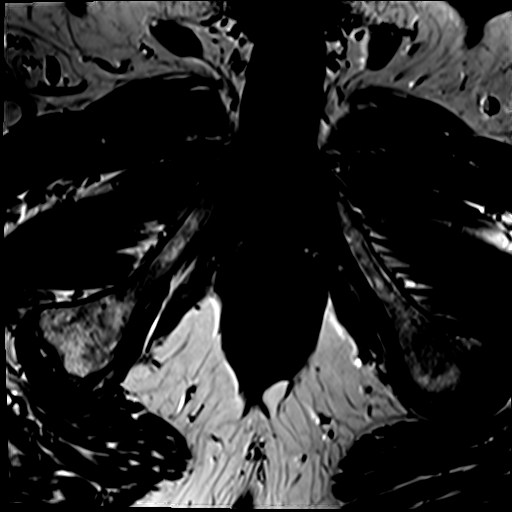

[Series 10: T2 · axial · 3.5mm · 0.56mm/px · 1 of 23 slices shown (3 of 4)]
[im 1/23]
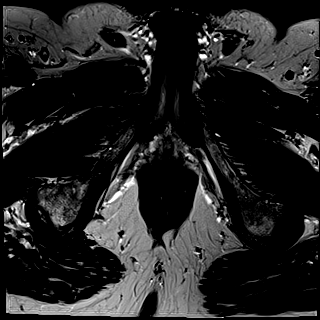

[Series 11: T2 · axial · 1.0mm · 1.04mm/px · 1 of 80 slices shown (4 of 4)]
[im 1/80]
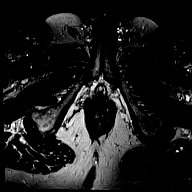

[Series 12: pre t1_twist_tra_dyn_ttc=5.3s · axial · non-contrast · 3.5mm · 0.83mm/px · 1 of 20 slices shown]
[im 1/20]
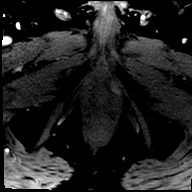

[Series 13: post t1_twist_tra_dyn-copy center · axial · 3.5mm · 0.83mm/px · 1 of 20 slices shown (1 of 24)]
[im 1/20]
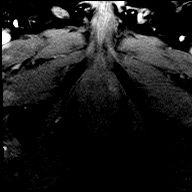

[Series 14: post t1_twist_tra_dyn-copy center · axial · 3.5mm · 0.83mm/px · 1 of 20 slices shown (2 of 24)]
[im 1/20]
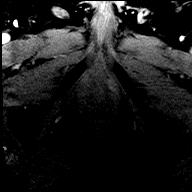

[Series 15: post t1_twist_tra_dyn-copy cent_sub_ttc=(id) · axial · 3.5mm · 0.83mm/px · 1 of 20 slices shown (1 of 22)]
[im 1/20]
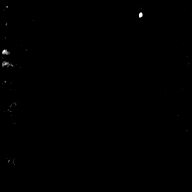

[Series 16: post t1_twist_tra_dyn-copy center · axial · 3.5mm · 0.83mm/px · 1 of 20 slices shown (3 of 24)]
[im 1/20]
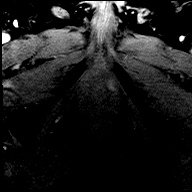

[Series 17: post t1_twist_tra_dyn-copy cent_sub_ttc=(id) · axial · 3.5mm · 0.83mm/px · 1 of 20 slices shown (2 of 22)]
[im 1/20]
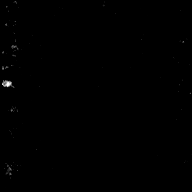

[Series 18: post t1_twist_tra_dyn-copy center · axial · 3.5mm · 0.83mm/px · 1 of 20 slices shown (4 of 24)]
[im 1/20]
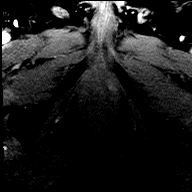

[Series 19: post t1_twist_tra_dyn-copy cent_sub_ttc=(id) · axial · 3.5mm · 0.83mm/px · 1 of 19 slices shown (3 of 22)]
[im 1/19]
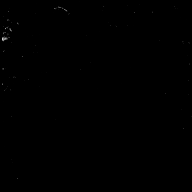

[Series 20: post t1_twist_tra_dyn-copy center · axial · 3.5mm · 0.83mm/px · 1 of 20 slices shown (5 of 24)]
[im 1/20]
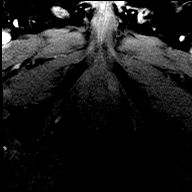

[Series 21: post t1_twist_tra_dyn-copy cent_sub_ttc=(id) · axial · 3.5mm · 0.83mm/px · 1 of 20 slices shown (4 of 22)]
[im 1/20]
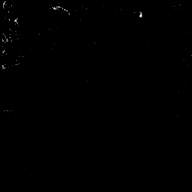

[Series 22: post t1_twist_tra_dyn-copy center · axial · 3.5mm · 0.83mm/px · 1 of 20 slices shown (6 of 24)]
[im 1/20]
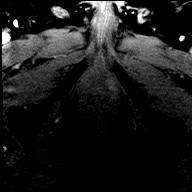

[Series 23: post t1_twist_tra_dyn-copy cent_sub_ttc=(id) · axial · 3.5mm · 0.83mm/px · 1 of 20 slices shown (5 of 22)]
[im 1/20]
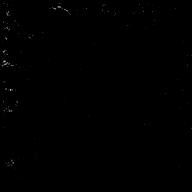

[Series 24: post t1_twist_tra_dyn-copy center · axial · 3.5mm · 0.83mm/px · 1 of 20 slices shown (7 of 24)]
[im 1/20]
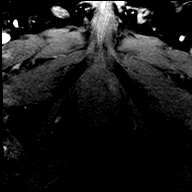

[Series 25: post t1_twist_tra_dyn-copy cent_sub_ttc=(id) · axial · 3.5mm · 0.83mm/px · 1 of 20 slices shown (6 of 22)]
[im 1/20]
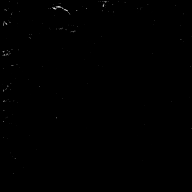

[Series 26: post t1_twist_tra_dyn-copy center · axial · 3.5mm · 0.83mm/px · 1 of 20 slices shown (8 of 24)]
[im 1/20]
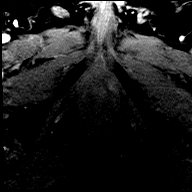

[Series 27: post t1_twist_tra_dyn-copy cent_sub_ttc=(id) · axial · 3.5mm · 0.83mm/px · 1 of 20 slices shown (7 of 22)]
[im 1/20]
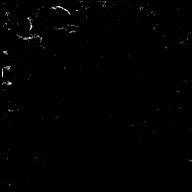

[Series 28: post t1_twist_tra_dyn-copy center · axial · 3.5mm · 0.83mm/px · 1 of 20 slices shown (9 of 24)]
[im 1/20]
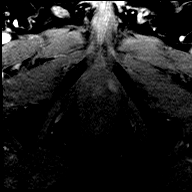

[Series 29: post t1_twist_tra_dyn-copy cent_sub_ttc=(id) · axial · 3.5mm · 0.83mm/px · 1 of 20 slices shown (8 of 22)]
[im 1/20]
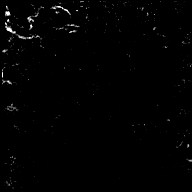

[Series 30: post t1_twist_tra_dyn-copy center · axial · 3.5mm · 0.83mm/px · 1 of 20 slices shown (10 of 24)]
[im 1/20]
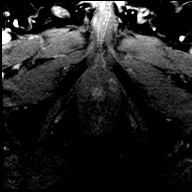

[Series 31: post t1_twist_tra_dyn-copy cent_sub_ttc=(id) · axial · 3.5mm · 0.83mm/px · 1 of 20 slices shown (9 of 22)]
[im 1/20]
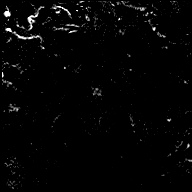

[Series 32: post t1_twist_tra_dyn-copy center · axial · 3.5mm · 0.83mm/px · 1 of 20 slices shown (11 of 24)]
[im 1/20]
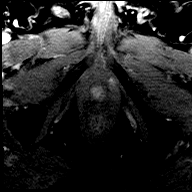

[Series 33: post t1_twist_tra_dyn-copy cent_sub_ttc=(id) · axial · 3.5mm · 0.83mm/px · 1 of 20 slices shown (10 of 22)]
[im 1/20]
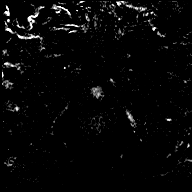

[Series 34: post t1_twist_tra_dyn-copy center · axial · 3.5mm · 0.83mm/px · 1 of 20 slices shown (12 of 24)]
[im 1/20]
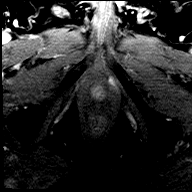

[Series 35: post t1_twist_tra_dyn-copy cent_sub_ttc=(id) · axial · 3.5mm · 0.83mm/px · 1 of 20 slices shown (11 of 22)]
[im 1/20]
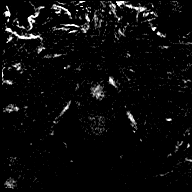

[Series 36: post t1_twist_tra_dyn-copy center · axial · 3.5mm · 0.83mm/px · 1 of 20 slices shown (13 of 24)]
[im 1/20]
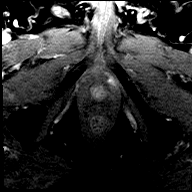

[Series 37: post t1_twist_tra_dyn-copy cent_sub_ttc=(id) · axial · 3.5mm · 0.83mm/px · 1 of 20 slices shown (12 of 22)]
[im 1/20]
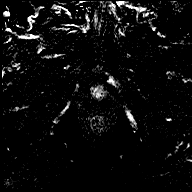

[Series 38: post t1_twist_tra_dyn-copy center · axial · 3.5mm · 0.83mm/px · 1 of 20 slices shown (14 of 24)]
[im 1/20]
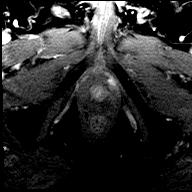

[Series 39: post t1_twist_tra_dyn-copy cent_sub_ttc=(id) · axial · 3.5mm · 0.83mm/px · 1 of 20 slices shown (13 of 22)]
[im 1/20]
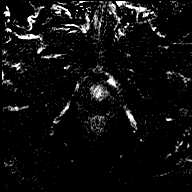

[Series 40: post t1_twist_tra_dyn-copy center · axial · 3.5mm · 0.83mm/px · 1 of 20 slices shown (15 of 24)]
[im 1/20]
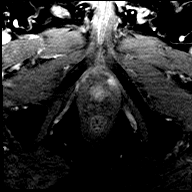

[Series 41: post t1_twist_tra_dyn-copy cent_sub_ttc=(id) · axial · 3.5mm · 0.83mm/px · 1 of 20 slices shown (14 of 22)]
[im 1/20]
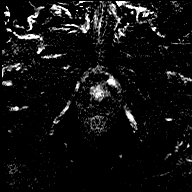

[Series 42: post t1_twist_tra_dyn-copy center · axial · 3.5mm · 0.83mm/px · 1 of 20 slices shown (16 of 24)]
[im 1/20]
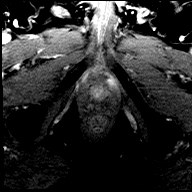

[Series 43: post t1_twist_tra_dyn-copy cent_sub_ttc=(id) · axial · 3.5mm · 0.83mm/px · 1 of 20 slices shown (15 of 22)]
[im 1/20]
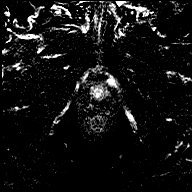

[Series 44: post t1_twist_tra_dyn-copy center · axial · 3.5mm · 0.83mm/px · 1 of 20 slices shown (17 of 24)]
[im 1/20]
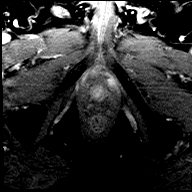

[Series 45: post t1_twist_tra_dyn-copy cent_sub_ttc=(id) · axial · 3.5mm · 0.83mm/px · 1 of 20 slices shown (16 of 22)]
[im 1/20]
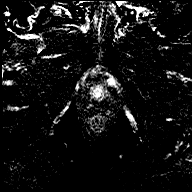

[Series 46: post t1_twist_tra_dyn-copy center · axial · 3.5mm · 0.83mm/px · 1 of 20 slices shown (18 of 24)]
[im 1/20]
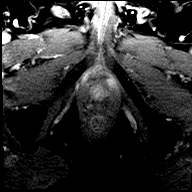

[Series 47: post t1_twist_tra_dyn-copy cent_sub_ttc=(id) · axial · 3.5mm · 0.83mm/px · 1 of 20 slices shown (17 of 22)]
[im 1/20]
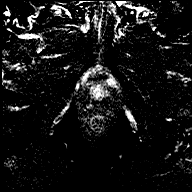

[Series 48: post t1_twist_tra_dyn-copy center · axial · 3.5mm · 0.83mm/px · 1 of 20 slices shown (19 of 24)]
[im 1/20]
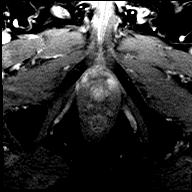

[Series 49: post t1_twist_tra_dyn-copy cent_sub_ttc=(id) · axial · 3.5mm · 0.83mm/px · 1 of 20 slices shown (18 of 22)]
[im 1/20]
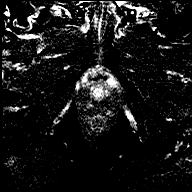

[Series 50: post t1_twist_tra_dyn-copy center · axial · 3.5mm · 0.83mm/px · 1 of 20 slices shown (20 of 24)]
[im 1/20]
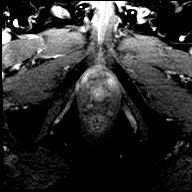

[Series 51: post t1_twist_tra_dyn-copy cent_sub_ttc=(id) · axial · 3.5mm · 0.83mm/px · 1 of 20 slices shown (19 of 22)]
[im 1/20]
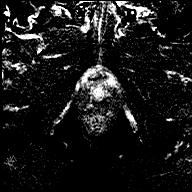

[Series 52: post t1_twist_tra_dyn-copy center · axial · 3.5mm · 0.83mm/px · 1 of 20 slices shown (21 of 24)]
[im 1/20]
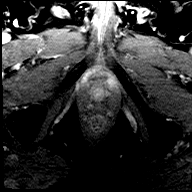

[Series 53: post t1_twist_tra_dyn-copy cent_sub_ttc=(id) · axial · 3.5mm · 0.83mm/px · 1 of 20 slices shown (20 of 22)]
[im 1/20]
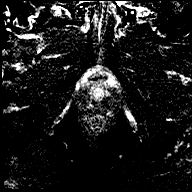

[Series 54: post t1_twist_tra_dyn-copy center · axial · 3.5mm · 0.83mm/px · 1 of 20 slices shown (22 of 24)]
[im 1/20]
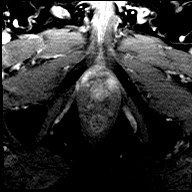

[Series 55: post t1_twist_tra_dyn-copy cent_sub_ttc=(id) · axial · 3.5mm · 0.83mm/px · 1 of 20 slices shown (21 of 22)]
[im 1/20]
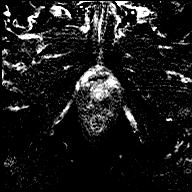

[Series 56: post t1_twist_tra_dyn-copy center · axial · 3.5mm · 0.83mm/px · 1 of 20 slices shown (23 of 24)]
[im 1/20]
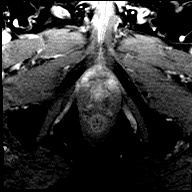

[Series 57: post t1_twist_tra_dyn-copy cent_sub_ttc=(id) · axial · 3.5mm · 0.83mm/px · 1 of 20 slices shown (22 of 22)]
[im 1/20]
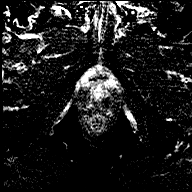

[Series 58: post t1_twist_tra_dyn-copy center · axial · 3.5mm · 0.83mm/px · 1 of 20 slices shown (24 of 24)]
[im 1/20]
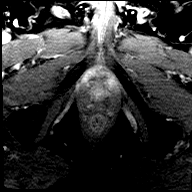

[56 of 56 positions shown; findings below may reference images not displayed]

FINDINGS: Prostate: No findings suspicious for high-grade macroscopic prostate
cancer within the peripheral zone. Specifically, no suspicious low
T2 lesion. No restricted diffusion/low ADC. No early arterial
enhancement. PI-RADS 1.

Enlargement/nodularity of the central gland, suggesting BPH. 17 x 9
x 12 mm irregular/smudgy low T2 lesion along the left anterior apex
of the central gland (series 10/image 17). Associated restricted
discriminatory. However, there is also associated early arterial
enhancement (series 28/image 16). PI-RADS 4.

Volume: 4.7 x 3.9 x 4.5 cm (calculated volume 46.3 mL)

Transcapsular spread: Central gland lesion transgresses the anterior
fibromuscular stroma.

Seminal vesicle involvement: Absent

Neurovascular bundle involvement: Absent

Pelvic adenopathy: Absent

Bone metastasis: Absent

Other findings: Thick-walled bladder although underdistended. Tiny
fat containing left inguinal hernia.
IMPRESSION: 1.7 cm lesion in the left anterior apex of the central gland,
raising concern for central gland tumor. PI-RADS 4. This lesion was
marked in DynaCAD 3D software for potential UroNAV biopsy.

No findings suspicious for high-grade macroscopic prostate cancer
within the peripheral zone.

Central gland lesion transgresses the anterior fibromuscular stroma.

No evidence of seminal vesicle invasion, lymphadenopathy, or
metastatic disease.

## 2021-11-28 DIAGNOSIS — C029 Malignant neoplasm of tongue, unspecified: Secondary | ICD-10-CM | POA: Diagnosis not present

## 2022-01-15 ENCOUNTER — Encounter: Payer: Self-pay | Admitting: Internal Medicine

## 2022-01-15 ENCOUNTER — Ambulatory Visit: Payer: Federal, State, Local not specified - PPO | Admitting: Internal Medicine

## 2022-01-15 DIAGNOSIS — F319 Bipolar disorder, unspecified: Secondary | ICD-10-CM | POA: Diagnosis not present

## 2022-01-15 DIAGNOSIS — C029 Malignant neoplasm of tongue, unspecified: Secondary | ICD-10-CM | POA: Diagnosis not present

## 2022-01-15 DIAGNOSIS — E039 Hypothyroidism, unspecified: Secondary | ICD-10-CM | POA: Diagnosis not present

## 2022-01-15 DIAGNOSIS — C61 Malignant neoplasm of prostate: Secondary | ICD-10-CM | POA: Diagnosis not present

## 2022-01-15 LAB — COMPREHENSIVE METABOLIC PANEL
ALT: 13 U/L (ref 0–53)
AST: 20 U/L (ref 0–37)
Albumin: 4.8 g/dL (ref 3.5–5.2)
Alkaline Phosphatase: 59 U/L (ref 39–117)
BUN: 14 mg/dL (ref 6–23)
CO2: 24 mEq/L (ref 19–32)
Calcium: 9.8 mg/dL (ref 8.4–10.5)
Chloride: 107 mEq/L (ref 96–112)
Creatinine, Ser: 1.11 mg/dL (ref 0.40–1.50)
GFR: 70.05 mL/min (ref >60.00)
Glucose, Bld: 101 mg/dL — ABNORMAL HIGH (ref 70–99)
Potassium: 4 mEq/L (ref 3.5–5.1)
Sodium: 140 mEq/L (ref 135–145)
Total Bilirubin: 0.6 mg/dL (ref 0.2–1.2)
Total Protein: 6.6 g/dL (ref 6.0–8.3)

## 2022-01-15 LAB — AMMONIA: Ammonia: 44 umol/L — ABNORMAL HIGH (ref 11–35)

## 2022-01-15 NOTE — Assessment & Plan Note (Signed)
Cont on Levothroid 

## 2022-01-15 NOTE — Assessment & Plan Note (Signed)
On q 6 months checks

## 2022-01-15 NOTE — Assessment & Plan Note (Signed)
Got married in April Doing well

## 2022-01-15 NOTE — Progress Notes (Addendum)
Subjective:  Patient ID: Adam Keller, male    DOB: 03/03/1956  Age: 66 y.o. MRN: [id number]  CC: No chief complaint on file.   HPI Adam Keller presents for epilepsy, hypothyroidism, cancers  Outpatient Medications Prior to Visit  Medication Sig Dispense Refill   colesevelam (WELCHOL) 625 MG tablet Take 625-1,250 mg by mouth daily as needed. For diarrhea     lamoTRIgine (LAMICTAL) 200 MG tablet Take 1 tablet (200 mg total) by mouth at bedtime. 30 tablet 1   levothyroxine (SYNTHROID) 50 MCG tablet TAKE 1 AND 1/2 TABLETS(75 MCG) BY MOUTH DAILY BEFORE BREAKFAST 135 tablet 3   lithium carbonate 600 MG capsule Take 1 capsule (600 mg total) by mouth 2 (two) times daily with a meal. 60 capsule 1   mometasone (NASONEX) 50 MCG/ACT nasal spray SHAKE LIQUID AND USE 2 SPRAYS IN EACH NOSTRIL DAILY 51 g 3   montelukast (SINGULAIR) 10 MG tablet TAKE 1 TABLET BY MOUTH EVERY DAY 90 tablet 2   OLANZapine (ZYPREXA) 7.5 MG tablet Take 7.5 mg by mouth in the morning and at bedtime.      omeprazole (PRILOSEC) 40 MG capsule Take 1 capsule (40 mg total) by mouth daily as needed. (Patient taking differently: Take 40 mg by mouth daily as needed (acid reflux).) 30 capsule 2   sildenafil (VIAGRA) 100 MG tablet SMARTSIG:1 Tablet(s) By Mouth As Directed PRN     No facility-administered medications prior to visit.    ROS: Review of Systems  Constitutional:  Negative for appetite change, fatigue and unexpected weight change.  HENT:  Negative for congestion, nosebleeds, sneezing, sore throat and trouble swallowing.   Eyes:  Negative for itching and visual disturbance.  Respiratory:  Negative for cough.   Cardiovascular:  Negative for chest pain, palpitations and leg swelling.  Gastrointestinal:  Negative for abdominal distention, blood in stool, diarrhea and nausea.  Genitourinary:  Negative for frequency and hematuria.  Musculoskeletal:  Negative for back pain, gait problem, joint swelling and neck pain.   Skin:  Negative for rash.  Neurological:  Negative for dizziness, tremors, speech difficulty and weakness.  Psychiatric/Behavioral:  Negative for agitation, dysphoric mood and sleep disturbance. The patient is not nervous/anxious.     Objective:  BP 118/72 (BP Location: Left Arm, Patient Position: Sitting, Cuff Size: Large)   Pulse 76   Temp 98.2 F (36.8 C) (Oral)   Ht 6' (1.829 m)   Wt 171 lb (77.6 kg)   SpO2 97%   BMI 23.19 kg/m   BP Readings from Last 3 Encounters:  01/15/22 118/72  10/16/21 110/76  07/17/21 118/79    Wt Readings from Last 3 Encounters:  01/15/22 171 lb (77.6 kg)  10/16/21 177 lb 6 oz (80.5 kg)  07/17/21 177 lb (80.3 kg)    Physical Exam Constitutional:      General: He is not in acute distress.    Appearance: He is well-developed.     Comments: NAD  Eyes:     Conjunctiva/sclera: Conjunctivae normal.     Pupils: Pupils are equal, round, and reactive to light.  Neck:     Thyroid: No thyromegaly.     Vascular: No JVD.  Cardiovascular:     Rate and Rhythm: Normal rate and regular rhythm.     Heart sounds: Normal heart sounds. No murmur heard.    No friction rub. No gallop.  Pulmonary:     Effort: Pulmonary effort is normal. No respiratory distress.     Breath  sounds: Normal breath sounds. No wheezing or rales.  Chest:     Chest wall: No tenderness.  Abdominal:     General: Bowel sounds are normal. There is no distension.     Palpations: Abdomen is soft. There is no mass.     Tenderness: There is no abdominal tenderness. There is no guarding or rebound.  Musculoskeletal:        General: No tenderness. Normal range of motion.     Cervical back: Normal range of motion.  Lymphadenopathy:     Cervical: No cervical adenopathy.  Skin:    General: Skin is warm and dry.     Findings: No rash.  Neurological:     Mental Status: He is alert and oriented to person, place, and time.     Cranial Nerves: No cranial nerve deficit.     Motor: No  abnormal muscle tone.     Coordination: Coordination normal.     Gait: Gait normal.     Deep Tendon Reflexes: Reflexes are normal and symmetric.  Psychiatric:        Behavior: Behavior normal.        Thought Content: Thought content normal.        Judgment: Judgment normal.     Lab Results  Component Value Date   WBC 4.2 10/16/2021   HGB 15.7 10/16/2021   HCT 44.9 10/16/2021   PLT 185.0 10/16/2021   GLUCOSE 98 10/16/2021   CHOL 213 (H) 10/16/2021   TRIG 105.0 10/16/2021   HDL 44.40 10/16/2021   LDLDIRECT 116.5 01/02/2011   LDLCALC 147 (H) 10/16/2021   ALT 13 10/16/2021   AST 21 10/16/2021   NA 138 10/16/2021   K 4.2 10/16/2021   CL 106 10/16/2021   CREATININE 1.06 10/16/2021   BUN 16 10/16/2021   CO2 25 10/16/2021   TSH 1.14 10/16/2021   PSA 0.00 Repeated and verified X2. (L) 04/15/2021   HGBA1C 4.7 (L) 12/03/2017    No results found.  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     Follow-up: Return in about 1 year (around 01/16/2023) for a follow-up visit.  Walker Kehr, MD

## 2022-03-03 DIAGNOSIS — F5101 Primary insomnia: Secondary | ICD-10-CM | POA: Diagnosis not present

## 2022-03-03 DIAGNOSIS — F3174 Bipolar disorder, in full remission, most recent episode manic: Secondary | ICD-10-CM | POA: Diagnosis not present

## 2022-04-10 DIAGNOSIS — Z85819 Personal history of malignant neoplasm of unspecified site of lip, oral cavity, and pharynx: Secondary | ICD-10-CM | POA: Diagnosis not present

## 2022-04-10 DIAGNOSIS — Z08 Encounter for follow-up examination after completed treatment for malignant neoplasm: Secondary | ICD-10-CM | POA: Diagnosis not present

## 2022-04-10 DIAGNOSIS — Z87891 Personal history of nicotine dependence: Secondary | ICD-10-CM | POA: Diagnosis not present

## 2022-04-14 ENCOUNTER — Ambulatory Visit: Payer: Federal, State, Local not specified - PPO | Admitting: Internal Medicine

## 2022-04-14 ENCOUNTER — Encounter: Payer: Self-pay | Admitting: Internal Medicine

## 2022-04-14 VITALS — BP 118/74 | HR 76 | Temp 99.5°F | Ht 72.0 in | Wt 178.8 lb

## 2022-04-14 DIAGNOSIS — Z23 Encounter for immunization: Secondary | ICD-10-CM

## 2022-04-14 DIAGNOSIS — R194 Change in bowel habit: Secondary | ICD-10-CM | POA: Diagnosis not present

## 2022-04-14 NOTE — Progress Notes (Addendum)
Pls cosign for [facility] inj../lmb  Medical screening examination/treatment/procedure(s) were performed by non-physician practitioner and as supervising physician I was immediately available for consultation/collaboration.  I agree with above. Lew Dawes, MD

## 2022-04-14 NOTE — Progress Notes (Signed)
Subjective:  Patient ID: Adam Keller, male    DOB: 08-Feb-1956  Age: 66 y.o. MRN: [id number]  CC: Follow-up (Change in bowel habits.. pt states now bowel is more like pea soup[)   HPI Adam Keller presents for a change in bowel habits.. pt states now bowel is more like pea soup now - 3/day as before. It started in April 2023. C/o stress  Outpatient Medications Prior to Visit  Medication Sig Dispense Refill   lamoTRIgine (LAMICTAL) 200 MG tablet Take 1 tablet (200 mg total) by mouth at bedtime. 30 tablet 1   levothyroxine (SYNTHROID) 50 MCG tablet TAKE 1 AND 1/2 TABLETS(75 MCG) BY MOUTH DAILY BEFORE BREAKFAST 135 tablet 3   lithium carbonate 600 MG capsule Take 1 capsule (600 mg total) by mouth 2 (two) times daily with a meal. 60 capsule 1   mometasone (NASONEX) 50 MCG/ACT nasal spray SHAKE LIQUID AND USE 2 SPRAYS IN EACH NOSTRIL DAILY 51 g 3   montelukast (SINGULAIR) 10 MG tablet TAKE 1 TABLET BY MOUTH EVERY DAY 90 tablet 2   OLANZapine (ZYPREXA) 7.5 MG tablet Take 7.5 mg by mouth in the morning and at bedtime.      omeprazole (PRILOSEC) 40 MG capsule Take 1 capsule (40 mg total) by mouth daily as needed. (Patient taking differently: Take 40 mg by mouth daily as needed (acid reflux).) 30 capsule 2   sildenafil (VIAGRA) 100 MG tablet SMARTSIG:1 Tablet(s) By Mouth As Directed PRN     colesevelam (WELCHOL) 625 MG tablet Take 625-1,250 mg by mouth daily as needed. For diarrhea     No facility-administered medications prior to visit.    ROS: Review of Systems  Constitutional:  Negative for appetite change, fatigue and unexpected weight change.  HENT:  Negative for congestion, nosebleeds, sneezing, sore throat and trouble swallowing.   Eyes:  Negative for itching and visual disturbance.  Respiratory:  Negative for cough.   Cardiovascular:  Negative for chest pain, palpitations and leg swelling.  Gastrointestinal:  Positive for diarrhea. Negative for abdominal distention, abdominal pain,  anal bleeding, blood in stool, nausea, rectal pain and vomiting.  Genitourinary:  Negative for frequency and hematuria.  Musculoskeletal:  Negative for back pain, gait problem, joint swelling and neck pain.  Skin:  Negative for rash.  Neurological:  Negative for dizziness, tremors, speech difficulty and weakness.  Psychiatric/Behavioral:  Negative for agitation, dysphoric mood, sleep disturbance and suicidal ideas. The patient is nervous/anxious.     Objective:  BP 118/74 (BP Location: Left Arm)   Pulse 76   Temp 99.5 F (37.5 C) (Oral)   Ht 6' (1.829 m)   Wt 178 lb 12.8 oz (81.1 kg)   SpO2 97%   BMI 24.25 kg/m   BP Readings from Last 3 Encounters:  04/14/22 118/74  01/15/22 118/72  10/16/21 110/76    Wt Readings from Last 3 Encounters:  04/14/22 178 lb 12.8 oz (81.1 kg)  01/15/22 171 lb (77.6 kg)  10/16/21 177 lb 6 oz (80.5 kg)    Physical Exam Constitutional:      General: He is not in acute distress.    Appearance: Normal appearance. He is well-developed.     Comments: NAD  Eyes:     Conjunctiva/sclera: Conjunctivae normal.     Pupils: Pupils are equal, round, and reactive to light.  Neck:     Thyroid: No thyromegaly.     Vascular: No JVD.  Cardiovascular:     Rate and Rhythm: Normal rate  and regular rhythm.     Heart sounds: Normal heart sounds. No murmur heard.    No friction rub. No gallop.  Pulmonary:     Effort: Pulmonary effort is normal. No respiratory distress.     Breath sounds: Normal breath sounds. No wheezing or rales.  Chest:     Chest wall: No tenderness.  Abdominal:     General: Bowel sounds are normal. There is no distension.     Palpations: Abdomen is soft. There is no mass.     Tenderness: There is no abdominal tenderness. There is no guarding or rebound.  Musculoskeletal:        General: No tenderness. Normal range of motion.     Cervical back: Normal range of motion.  Lymphadenopathy:     Cervical: No cervical adenopathy.  Skin:     General: Skin is warm and dry.     Findings: No rash.  Neurological:     Mental Status: He is alert and oriented to person, place, and time.     Cranial Nerves: No cranial nerve deficit.     Motor: No abnormal muscle tone.     Coordination: Coordination normal.     Gait: Gait normal.     Deep Tendon Reflexes: Reflexes are normal and symmetric.  Psychiatric:        Behavior: Behavior normal.        Thought Content: Thought content normal.        Judgment: Judgment normal.     Lab Results  Component Value Date   WBC 4.2 10/16/2021   HGB 15.7 10/16/2021   HCT 44.9 10/16/2021   PLT 185.0 10/16/2021   GLUCOSE 101 (H) 01/15/2022   CHOL 213 (H) 10/16/2021   TRIG 105.0 10/16/2021   HDL 44.40 10/16/2021   LDLDIRECT 116.5 01/02/2011   LDLCALC 147 (H) 10/16/2021   ALT 13 01/15/2022   AST 20 01/15/2022   NA 140 01/15/2022   K 4.0 01/15/2022   CL 107 01/15/2022   CREATININE 1.11 01/15/2022   BUN 14 01/15/2022   CO2 24 01/15/2022   TSH 1.14 10/16/2021   PSA 0.00 Repeated and verified X2. (L) 04/15/2021   HGBA1C 4.7 (L) 12/03/2017    No results found.  Assessment & Plan:   Problem List Items Addressed This Visit     Bowel habit changes - Primary    New change in bowel habits.. pt states now bowel is more like pea soup now - 3/day as before. It started in April 2023: stress vs other. GI ref - Dr Fuller Plan      Relevant Orders   Ambulatory referral to Gastroenterology   Other Visit Diagnoses     Needs flu shot       Relevant Orders   Flu Vaccine QUAD High Dose(Fluad) (Completed)         No orders of the defined types were placed in this encounter.     Follow-up: Return in about 3 months (around 07/15/2022) for a follow-up visit.  Walker Kehr, MD

## 2022-04-14 NOTE — Addendum Note (Signed)
Addended by: Earnstine Regal on: 04/14/2022 01:49 PM   Modules accepted: Orders

## 2022-04-14 NOTE — Assessment & Plan Note (Signed)
New change in bowel habits.. pt states now bowel is more like pea soup now - 3/day as before. It started in April 2023: stress vs other. GI ref - Dr Fuller Plan

## 2022-04-21 ENCOUNTER — Encounter: Payer: Self-pay | Admitting: Gastroenterology

## 2022-05-12 ENCOUNTER — Other Ambulatory Visit: Payer: Self-pay | Admitting: Internal Medicine

## 2022-05-19 ENCOUNTER — Ambulatory Visit: Payer: Federal, State, Local not specified - PPO | Admitting: Internal Medicine

## 2022-05-23 DIAGNOSIS — C61 Malignant neoplasm of prostate: Secondary | ICD-10-CM | POA: Diagnosis not present

## 2022-06-18 ENCOUNTER — Other Ambulatory Visit: Payer: Federal, State, Local not specified - PPO

## 2022-06-18 ENCOUNTER — Ambulatory Visit: Payer: Federal, State, Local not specified - PPO | Admitting: Gastroenterology

## 2022-06-18 ENCOUNTER — Encounter: Payer: Self-pay | Admitting: Gastroenterology

## 2022-06-18 VITALS — BP 128/80 | HR 80 | Ht 72.0 in | Wt 181.0 lb

## 2022-06-18 DIAGNOSIS — R194 Change in bowel habit: Secondary | ICD-10-CM

## 2022-06-18 DIAGNOSIS — R197 Diarrhea, unspecified: Secondary | ICD-10-CM

## 2022-06-18 MED ORDER — NA SULFATE-K SULFATE-MG SULF 17.5-3.13-1.6 GM/177ML PO SOLN: 1.0000 | Freq: Once | ORAL | 0 refills | Status: AC

## 2022-06-18 NOTE — Progress Notes (Signed)
Assessment    Change in bowel habits, diarrhea. R/O infection, celiac disease, IBD, microscopic colitis, pancreatic insufficiency, diet related  Recommendations   tTG, IgA, fecal calprotectin, fecal elastase, GI stool profile Schedule colonoscopy. The risks (including bleeding, perforation, infection, missed lesions, medication reactions and possible hospitalization or surgery if complications occur), benefits, and alternatives to colonoscopy with possible biopsy and possible polypectomy were discussed with the patient and they consent to proceed.      HPI   Chief complaint: Change in bowel habits, diarrhea  Patient profile:  Adam Keller is a 66 y.o. male referred by Walker Kehr MD for change in bowel habits, looser stools/diarrhea.  He is accompanied by his wife.  He was married in April 2023.  He states in April 2023 he noted a change in bowel habits from 2 formed stools in the morning to 2 looser stools in the morning and 1 looser stool in the afternoon.  He states his fruit and vegetable intake went up a bit following his marriage but no other medication or dietary changes. Denies weight loss, abdominal pain, constipation, change in stool caliber, melena, hematochezia, nausea, vomiting, dysphagia, reflux symptoms, chest pain.   Previous Labs / Imaging::    Latest Ref Rng & Units 10/16/2021    1:45 PM 12/12/2020    2:46 PM 06/13/2020    2:55 PM  CBC  WBC 4.0 - 10.5 K/uL 4.2  4.4  4.9   Hemoglobin 13.0 - 17.0 g/dL 15.7  15.9  15.2   Hematocrit 39.0 - 52.0 % 44.9  45.0  43.3   Platelets 150.0 - 400.0 K/uL 185.0  198.0  202.0     No results found for: "LIPASE"    Latest Ref Rng & Units 01/15/2022    2:12 PM 10/16/2021    1:45 PM 07/17/2021    2:11 PM  CMP  Glucose 70 - 99 mg/dL 101  98  98   BUN 6 - 23 mg/dL '14  16  14   '$ Creatinine 0.40 - 1.50 mg/dL 1.11  1.06  0.95   Sodium 135 - 145 mEq/L 140  138  138   Potassium 3.5 - 5.1 mEq/L 4.0  4.2  4.2   Chloride 96 - 112 mEq/L  107  106  106   CO2 19 - 32 mEq/L '24  25  26   '$ Calcium 8.4 - 10.5 mg/dL 9.8  9.9  10.0   Total Protein 6.0 - 8.3 g/dL 6.6  6.8  6.9   Total Bilirubin 0.2 - 1.2 mg/dL 0.6  0.8  0.7   Alkaline Phos 39 - 117 U/L 59  61  70   AST 0 - 37 U/L '20  21  31   '$ ALT 0 - 53 U/L '13  13  25      '$ Previous GI evaluation    Endoscopies:  Colonoscopy July 2015 Sessile polyp measuring 6 mm in the sigmoid colon; polypectomy performed with a cold snare 2.  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[place])    Past Surgical History:  Procedure Laterality Date   COLONOSCOPY     EXCISION OF TONGUE LESION     lymph node removal     LYMPHADENECTOMY Bilateral 12/05/2019   Procedure: LYMPHADENECTOMY, PELVIC;  Surgeon: Raynelle Bring, MD;  Location: [facility];  Service: Urology;  Laterality: Bilateral;   PROSTATE BIOPSY     ROBOT ASSISTED LAPAROSCOPIC RADICAL PROSTATECTOMY N/A 12/05/2019   Procedure: XI ROBOTIC ASSISTED LAPAROSCOPIC RADICAL PROSTATECTOMY LEVEL 2;  Surgeon: Raynelle Bring, MD;  Location: [facility];  Service: Urology;  Laterality: N/A;   Vocal Cord Surgery  2011   implants   Family History  Problem Relation Age of Onset   Heart disease Mother        CHF   Colon cancer Mother    Diabetes Mother    Hyperlipidemia Mother    Heart disease Father        CHF   Hypertension Other    Liver disease Neg Hx    Esophageal cancer Neg Hx    Colon polyps Neg  Hx    Social History   Tobacco Use   Smoking status: Former    Types: Cigarettes, Cigars    Quit date: 07/28/2008    Years since quitting: 13.8   Smokeless tobacco: Never  Vaping Use   Vaping Use: Never used  Substance Use Topics   Alcohol use: No   Drug use: No   Current Outpatient Medications  Medication Sig Dispense Refill   lamoTRIgine (LAMICTAL) 200 MG tablet Take 1 tablet (200 mg total) by mouth at bedtime. 30 tablet 1   levothyroxine (SYNTHROID) 50 MCG tablet TAKE 1 AND 1/2 TABLETS(75 MCG) BY MOUTH DAILY BEFORE BREAKFAST 135 tablet 3   lithium carbonate 600 MG capsule Take 1 capsule (600 mg total) by mouth 2 (two) times daily with a meal. 60 capsule 1   mometasone (NASONEX) 50 MCG/ACT nasal spray SHAKE LIQUID AND USE 2 SPRAYS IN EACH NOSTRIL DAILY 51 g 3   montelukast (SINGULAIR) 10 MG tablet TAKE 1 TABLET BY MOUTH EVERY DAY 90 tablet 2   omeprazole (PRILOSEC) 40 MG capsule Take 1 capsule (40 mg total) by mouth daily as needed. (Patient taking differently: Take 40 mg by mouth daily as needed (acid reflux).) 30 capsule 2   sildenafil (VIAGRA) 100 MG tablet SMARTSIG:1 Tablet(s) By Mouth As Directed PRN     OLANZapine (ZYPREXA) 7.5 MG tablet Take 7.5 mg by mouth in the morning and at bedtime.  (Patient not taking: Reported on 06/18/2022)     No current facility-administered medications for this visit.   Allergies  Allergen Reactions   Fluoxetine Hcl Other (See Comments)    "severe mental reaction"   Bactrim [Sulfamethoxazole-Trimethoprim]     Swollen lip   Azithromycin Diarrhea   Cephalexin Rash   Erythromycin Nausea Only   Ketorolac Tromethamine Nausea And Vomiting    Review of Systems: All other systems reviewed and negative except where noted in HPI.    Physical Exam    Wt Readings from Last 3 Encounters:  06/18/22 181 lb (82.1 kg)  04/14/22 178 lb 12.8 oz (81.1 kg)  01/15/22 171 lb (77.6 kg)    BP 128/80   Pulse 80   Ht 6' (1.829 m)   [facility]  181 lb (82.1 kg)    SpO2 98%   BMI 24.55 kg/m  Constitutional:  Generally well appearing male in no acute distress. HEENT: Pupils normal.  Conjunctivae are normal. No scleral icterus. No oral lesions or deformities noted.  Neck: Supple.  Cardiac: Normal rate, regular rhythm without murmurs. Pulmonary/chest: Effort normal and breath sounds normal. No wheezing, rales or rhonchi. Abdominal: Soft, nondistended, nontender. Active bowel sounds. No palpable HSM, masses or hernias. Rectal: Deferred to colonoscopy Extremities: No edema or deformities noted Neurological: Alert and oriented to person, place and time. Psychiatric: Pleasant. Normal mood and affect. Behavior is normal. Skin: Skin is warm and dry. No rashes noted.  Lucio Edward, MD   cc:  Referring Provider Plotnikov, Evie Lacks, MD

## 2022-06-18 NOTE — Patient Instructions (Signed)
Your provider has requested that you go to the basement level for lab work before leaving today. Press "B" on the elevator. The lab is located at the first door on the left as you exit the elevator. ° °You have been scheduled for a colonoscopy. Please follow written instructions given to you at your visit today.  °Please pick up your prep supplies at the pharmacy within the next 1-3 days. °If you use inhalers (even only as needed), please bring them with you on the day of your procedure. ° °The [facility] providers would like to encourage you to use MYCHART to communicate with providers for non-urgent requests or questions.  Due to long hold times on the telephone, sending your provider a message by MYCHART may be a faster and more efficient way to get a response.  Please allow 48 business hours for a response.  Please remember that this is for non-urgent requests.  ° °Due to recent changes in healthcare laws, you may see the results of your imaging and laboratory studies on MyChart before your provider has had a chance to review them.  We understand that in some cases there may be results that are confusing or concerning to you. Not all laboratory results come back in the same time frame and the provider may be waiting for multiple results in order to interpret others.  Please give us 48 hours in order for your provider to thoroughly review all the results before contacting the office for clarification of your results.  ° °Thank you for choosing me and [facility]. ° °Malcolm T. Stark, Jr., MD., FACG ° ° °

## 2022-06-19 ENCOUNTER — Other Ambulatory Visit: Payer: Federal, State, Local not specified - PPO

## 2022-06-19 DIAGNOSIS — R197 Diarrhea, unspecified: Secondary | ICD-10-CM | POA: Diagnosis not present

## 2022-06-19 DIAGNOSIS — R194 Change in bowel habit: Secondary | ICD-10-CM

## 2022-06-19 LAB — TISSUE TRANSGLUTAMINASE, IGA: (tTG) Ab, IgA: <1 U/mL

## 2022-06-19 LAB — IGA: Immunoglobulin A: 99 mg/dL (ref 70–320)

## 2022-06-20 DIAGNOSIS — Z87891 Personal history of nicotine dependence: Secondary | ICD-10-CM | POA: Diagnosis not present

## 2022-06-20 DIAGNOSIS — Z85819 Personal history of malignant neoplasm of unspecified site of lip, oral cavity, and pharynx: Secondary | ICD-10-CM | POA: Diagnosis not present

## 2022-06-20 DIAGNOSIS — Z08 Encounter for follow-up examination after completed treatment for malignant neoplasm: Secondary | ICD-10-CM | POA: Diagnosis not present

## 2022-06-23 LAB — GI PROFILE, STOOL, PCR

## 2022-06-24 LAB — CALPROTECTIN, FECAL: Calprotectin, Fecal: 109 ug/g (ref 0–120)

## 2022-06-25 LAB — PANCREATIC ELASTASE, FECAL: Pancreatic Elastase-1, Stool: 437 mcg/g

## 2022-06-26 ENCOUNTER — Ambulatory Visit (AMBULATORY_SURGERY_CENTER): Payer: Federal, State, Local not specified - PPO | Admitting: Gastroenterology

## 2022-06-26 ENCOUNTER — Encounter: Payer: Self-pay | Admitting: Gastroenterology

## 2022-06-26 VITALS — BP 95/71 | HR 79 | Temp 98.2°F | Resp 19 | Ht 72.0 in | Wt 181.0 lb

## 2022-06-26 DIAGNOSIS — R197 Diarrhea, unspecified: Secondary | ICD-10-CM

## 2022-06-26 DIAGNOSIS — R194 Change in bowel habit: Secondary | ICD-10-CM

## 2022-06-26 DIAGNOSIS — D123 Benign neoplasm of transverse colon: Secondary | ICD-10-CM

## 2022-06-26 DIAGNOSIS — K635 Polyp of colon: Secondary | ICD-10-CM | POA: Diagnosis not present

## 2022-06-26 MED ORDER — SODIUM CHLORIDE 0.9 % IV SOLN: 500.0000 mL | Freq: Once | INTRAVENOUS | Status: DC

## 2022-06-26 NOTE — Progress Notes (Signed)
Pt resting comfortably. VSS. Airway intact. SBAR complete to RN. All questions answered.   

## 2022-06-26 NOTE — Progress Notes (Signed)
See 06/18/2022 H&P, no changes

## 2022-06-26 NOTE — Op Note (Signed)
[place] [facility] Patient Name: Adam Keller Procedure Date: 06/26/2022 3:23 PM MRN: [id number] Endoscopist: Ladene Artist , MD, [id number] Age: 66 Referring MD:  Date of Birth: 11-24-1955 Gender: Male Account #: [id number] Procedure:                Colonoscopy Indications:              Clinically significant diarrhea of unexplained                            origin, Change in bowel habits Medicines:                Monitored Anesthesia Care Procedure:                Pre-Anesthesia Assessment:                           - Prior to the procedure, a History and Physical                            was performed, and patient medications and                            allergies were reviewed. The patient's tolerance of                            previous anesthesia was also reviewed. The risks                            and benefits of the procedure and the sedation                            options and risks were discussed with the patient.                            All questions were answered, and informed consent                            was obtained. Prior Anticoagulants: The patient has                            taken no anticoagulant or antiplatelet agents. ASA                            Grade Assessment: II - A patient with mild systemic                            disease. After reviewing the risks and benefits,                            the patient was deemed in satisfactory condition to                            undergo the procedure.  After obtaining informed consent, the colonoscope                            was passed under direct vision. Throughout the                            procedure, the patient's blood pressure, pulse, and                            oxygen saturations were monitored continuously. The                            PCF-HQ190L Colonoscope was introduced through the                            anus and advanced to the the  cecum, identified by                            appendiceal orifice and ileocecal valve. The                            ileocecal valve, appendiceal orifice, and rectum                            were photographed. The quality of the bowel                            preparation was good. The colonoscopy was performed                            without difficulty. The patient tolerated the                            procedure well. Scope In: 3:34:48 PM Scope Out: 3:54:27 PM Scope Withdrawal Time: 0 hours 16 minutes 25 seconds  Total Procedure Duration: 0 hours 19 minutes 39 seconds  Findings:                 The perianal and digital rectal examinations were                            normal.                           Two sessile polyps were found in the transverse                            colon. The polyps were 6 to 7 mm in size. These                            polyps were removed with a cold snare. Resection                            and retrieval were complete.  Internal hemorrhoids were found during                            retroflexion. The hemorrhoids were small and Grade                            I (internal hemorrhoids that do not prolapse).                           The exam was otherwise without abnormality on                            direct and retroflexion views. Biopsied. Complications:            No immediate complications. Estimated blood loss:                            None. Estimated Blood Loss:     Estimated blood loss: none. Impression:               - Two 6 to 7 mm polyps in the transverse colon,                            removed with a cold snare. Resected and retrieved.                           - Internal hemorrhoids.                           - The examination was otherwise normal on direct                            and retroflexion views. Biopsied. Recommendation:           - Repeat colonoscopy after studies are complete for                             surveillance based on pathology results.                           - Patient has a contact number available for                            emergencies. The signs and symptoms of potential                            delayed complications were discussed with the                            patient. Return to normal activities tomorrow.                            Written discharge instructions were provided to the                            patient.                           -  Resume previous diet.                           - Continue present medications.                           - Await pathology results. Ladene Artist, MD 06/26/2022 3:59:42 PM This report has been signed electronically.

## 2022-06-26 NOTE — Progress Notes (Signed)
Called to room to assist during endoscopic procedure.  Patient ID and intended procedure confirmed with present staff. Received instructions for my participation in the procedure from the performing physician.Pt's states no medical or surgical changes since previsit or office visit. VS assessed by C.W

## 2022-06-26 NOTE — Patient Instructions (Signed)
Handouts provided about hemorrhoids and polyps.  Resume previous diet.  Continue present medications.  Await pathology results.  Repeat colonoscopy after studies are complete for surveillance based on pathology results.     YOU HAD AN ENDOSCOPIC PROCEDURE TODAY AT [facility]:   Refer to the procedure report that was given to you for any specific questions about what was found during the examination.  If the procedure report does not answer your questions, please call your gastroenterologist to clarify.  If you requested that your care partner not be given the details of your procedure findings, then the procedure report has been included in a sealed envelope for you to review at your convenience later.  YOU SHOULD EXPECT: Some feelings of bloating in the abdomen. Passage of more gas than usual.  Walking can help get rid of the air that was put into your GI tract during the procedure and reduce the bloating. If you had a lower endoscopy (such as a colonoscopy or flexible sigmoidoscopy) you may notice spotting of blood in your stool or on the toilet paper. If you underwent a bowel prep for your procedure, you may not have a normal bowel movement for a few days.  Please Note:  You might notice some irritation and congestion in your nose or some drainage.  This is from the oxygen used during your procedure.  There is no need for concern and it should clear up in a day or so.  SYMPTOMS TO REPORT IMMEDIATELY:  Following lower endoscopy (colonoscopy or flexible sigmoidoscopy):  Excessive amounts of blood in the stool  Significant tenderness or worsening of abdominal pains  Swelling of the abdomen that is new, acute  Fever of 100F or higher   For urgent or emergent issues, a gastroenterologist can be reached at any hour by calling [phone]. Do not use MyChart messaging for urgent concerns.    DIET:  We do recommend a small meal at first, but then you may proceed to your regular  diet.  Drink plenty of fluids but you should avoid alcoholic beverages for 24 hours.  ACTIVITY:  You should plan to take it easy for the rest of today and you should NOT DRIVE or use heavy machinery until tomorrow (because of the sedation medicines used during the test).    FOLLOW UP: Our staff will call the number listed on your records the next business day following your procedure.  We will call around 7:15- 8:00 am to check on you and address any questions or concerns that you may have regarding the information given to you following your procedure. If we do not reach you, we will leave a message.     If any biopsies were taken you will be contacted by phone or by letter within the next 1-3 weeks.  Please call us at [phone] if you have not heard about the biopsies in 3 weeks.    SIGNATURES/CONFIDENTIALITY: You and/or your care partner have signed paperwork which will be entered into your electronic medical record.  These signatures attest to the fact that that the information above on your After Visit Summary has been reviewed and is understood.  Full responsibility of the confidentiality of this discharge information lies with you and/or your care-partner.

## 2022-06-27 ENCOUNTER — Telehealth: Payer: Self-pay | Admitting: *Deleted

## 2022-06-27 NOTE — Telephone Encounter (Signed)
  Follow up Call-     06/26/2022    3:05 PM  Call back number  Post procedure Call Back phone  # [phone]  Permission to leave phone message No     Patient questions:  Do you have a fever, pain , or abdominal swelling? No. Pain Score  0 *  Have you tolerated food without any problems? Yes.    Have you been able to return to your normal activities? Yes.    Do you have any questions about your discharge instructions: Diet   No. Medications  No. Follow up visit  No.  Do you have questions or concerns about your Care? No.  Actions: * If pain score is 4 or above: No action needed, pain <4.

## 2022-07-08 ENCOUNTER — Other Ambulatory Visit: Payer: Self-pay

## 2022-07-08 ENCOUNTER — Telehealth: Payer: Self-pay | Admitting: Internal Medicine

## 2022-07-08 MED ORDER — LEVOTHYROXINE SODIUM 50 MCG PO TABS: ORAL_TABLET | ORAL | 3 refills | Status: DC

## 2022-07-08 NOTE — Telephone Encounter (Signed)
Patient called and needs a refill on his levothyroxine (synthroid) 66mg.

## 2022-07-08 NOTE — Telephone Encounter (Signed)
Refill has been sent in.  

## 2022-07-15 ENCOUNTER — Encounter: Payer: Self-pay | Admitting: Gastroenterology

## 2022-07-16 ENCOUNTER — Ambulatory Visit: Payer: Federal, State, Local not specified - PPO | Admitting: Internal Medicine

## 2022-08-04 ENCOUNTER — Encounter: Payer: Self-pay | Admitting: Internal Medicine

## 2022-08-04 ENCOUNTER — Ambulatory Visit: Payer: Federal, State, Local not specified - PPO | Admitting: Internal Medicine

## 2022-08-04 VITALS — BP 120/78 | HR 84 | Temp 98.3°F | Ht 72.0 in | Wt 188.2 lb

## 2022-08-04 DIAGNOSIS — R634 Abnormal weight loss: Secondary | ICD-10-CM | POA: Diagnosis not present

## 2022-08-04 DIAGNOSIS — R194 Change in bowel habit: Secondary | ICD-10-CM | POA: Diagnosis not present

## 2022-08-04 DIAGNOSIS — Z23 Encounter for immunization: Secondary | ICD-10-CM | POA: Diagnosis not present

## 2022-08-04 DIAGNOSIS — E039 Hypothyroidism, unspecified: Secondary | ICD-10-CM

## 2022-08-04 DIAGNOSIS — F319 Bipolar disorder, unspecified: Secondary | ICD-10-CM

## 2022-08-04 LAB — AMMONIA: Ammonia: 29 umol/L (ref 11–35)

## 2022-08-04 LAB — COMPREHENSIVE METABOLIC PANEL
ALT: 19 U/L (ref 0–53)
AST: 23 U/L (ref 0–37)
Albumin: 4.8 g/dL (ref 3.5–5.2)
Alkaline Phosphatase: 67 U/L (ref 39–117)
BUN: 14 mg/dL (ref 6–23)
CO2: 26 mEq/L (ref 19–32)
Calcium: 9.9 mg/dL (ref 8.4–10.5)
Chloride: 105 mEq/L (ref 96–112)
Creatinine, Ser: 1.01 mg/dL (ref 0.40–1.50)
GFR: 78.15 mL/min (ref >60.00)
Glucose, Bld: 94 mg/dL (ref 70–99)
Potassium: 4.1 mEq/L (ref 3.5–5.1)
Sodium: 139 mEq/L (ref 135–145)
Total Bilirubin: 0.6 mg/dL (ref 0.2–1.2)
Total Protein: 7.1 g/dL (ref 6.0–8.3)

## 2022-08-04 NOTE — Addendum Note (Signed)
Addended by: Marijean Heath R on: 08/04/2022 02:47 PM   Modules accepted: Orders

## 2022-08-04 NOTE — Progress Notes (Signed)
Subjective:  Patient ID: Adam Keller, male    DOB: [birth year]  Age: 67 y.o. MRN: [id number]  CC: Follow-up (3 month f/u )   HPI CULLY LUCKOW presents for diarrhea, hypothyroidism, bipolar disorder Here w/wife   Outpatient Medications Prior to Visit  Medication Sig Dispense Refill   lamoTRIgine (LAMICTAL) 200 MG tablet Take 1 tablet (200 mg total) by mouth at bedtime. 30 tablet 1   levothyroxine (SYNTHROID) 50 MCG tablet TAKE 1 AND 1/2 TABLETS(75 MCG) BY MOUTH DAILY BEFORE BREAKFAST 135 tablet 3   lithium carbonate 600 MG capsule Take 1 capsule (600 mg total) by mouth 2 (two) times daily with a meal. 60 capsule 1   mometasone (NASONEX) 50 MCG/ACT nasal spray SHAKE LIQUID AND USE 2 SPRAYS IN EACH NOSTRIL DAILY 51 g 3   OLANZapine (ZYPREXA) 7.5 MG tablet Take 7.5 mg by mouth in the morning and at bedtime.     montelukast (SINGULAIR) 10 MG tablet TAKE 1 TABLET BY MOUTH EVERY DAY (Patient not taking: Reported on 06/26/2022) 90 tablet 2   omeprazole (PRILOSEC) 40 MG capsule Take 1 capsule (40 mg total) by mouth daily as needed. (Patient not taking: Reported on 06/26/2022) 30 capsule 2   sildenafil (VIAGRA) 100 MG tablet SMARTSIG:1 Tablet(s) By Mouth As Directed PRN (Patient not taking: Reported on 06/26/2022)     No facility-administered medications prior to visit.    ROS: Review of Systems  Constitutional:  Negative for appetite change, fatigue and unexpected weight change.  HENT:  Negative for congestion, nosebleeds, sneezing, sore throat and trouble swallowing.   Eyes:  Negative for itching and visual disturbance.  Respiratory:  Negative for cough.   Cardiovascular:  Negative for chest pain, palpitations and leg swelling.  Gastrointestinal:  Negative for abdominal distention, blood in stool, diarrhea and nausea.  Genitourinary:  Negative for frequency and hematuria.  Musculoskeletal:  Negative for back pain, gait problem, joint swelling and neck pain.  Skin:  Negative for rash.   Neurological:  Negative for dizziness, tremors, speech difficulty and weakness.  Psychiatric/Behavioral:  Negative for agitation, dysphoric mood and sleep disturbance. The patient is not nervous/anxious.     Objective:  BP 120/78 (BP Location: Left Arm, Patient Position: Sitting, Cuff Size: Large)   Pulse 84   Temp 98.3 F (36.8 C) (Oral)   Ht 6' (1.829 m)   Wt 188 lb 3.2 oz (85.4 kg)   SpO2 98%   BMI 25.52 kg/m   BP Readings from Last 3 Encounters:  08/04/22 120/78  06/26/22 95/71  06/18/22 128/80    Wt Readings from Last 3 Encounters:  08/04/22 188 lb 3.2 oz (85.4 kg)  06/26/22 181 lb (82.1 kg)  06/18/22 181 lb (82.1 kg)    Physical Exam Constitutional:      General: He is not in acute distress.    Appearance: He is well-developed.     Comments: NAD  Eyes:     Conjunctiva/sclera: Conjunctivae normal.     Pupils: Pupils are equal, round, and reactive to light.  Neck:     Thyroid: No thyromegaly.     Vascular: No JVD.  Cardiovascular:     Rate and Rhythm: Normal rate and regular rhythm.     Heart sounds: Normal heart sounds. No murmur heard.    No friction rub. No gallop.  Pulmonary:     Effort: Pulmonary effort is normal. No respiratory distress.     Breath sounds: Normal breath sounds. No wheezing or rales.  Chest:     Chest wall: No tenderness.  Abdominal:     General: Bowel sounds are normal. There is no distension.     Palpations: Abdomen is soft. There is no mass.     Tenderness: There is no abdominal tenderness. There is no guarding or rebound.  Musculoskeletal:        General: No tenderness. Normal range of motion.     Cervical back: Normal range of motion.  Lymphadenopathy:     Cervical: No cervical adenopathy.  Skin:    General: Skin is warm and dry.     Findings: No rash.  Neurological:     Mental Status: He is alert and oriented to person, place, and time.     Cranial Nerves: No cranial nerve deficit.     Motor: No abnormal muscle tone.      Coordination: Coordination normal.     Gait: Gait normal.     Deep Tendon Reflexes: Reflexes are normal and symmetric.  Psychiatric:        Behavior: Behavior normal.        Thought Content: Thought content normal.        Judgment: Judgment normal.     Lab Results  Component Value Date   WBC 4.2 10/16/2021   HGB 15.7 10/16/2021   HCT 44.9 10/16/2021   PLT 185.0 10/16/2021   GLUCOSE 101 (H) 01/15/2022   CHOL 213 (H) 10/16/2021   TRIG 105.0 10/16/2021   HDL 44.40 10/16/2021   LDLDIRECT 116.5 01/02/2011   LDLCALC 147 (H) 10/16/2021   ALT 13 01/15/2022   AST 20 01/15/2022   NA 140 01/15/2022   K 4.0 01/15/2022   CL 107 01/15/2022   CREATININE 1.11 01/15/2022   BUN 14 01/15/2022   CO2 24 01/15/2022   TSH 1.14 10/16/2021   PSA 0.00 Repeated and verified X2. (L) 04/15/2021   HGBA1C 4.7 (L) 12/03/2017    No results found.  Assessment & Plan:   Problem List Items Addressed This Visit       Endocrine   Hypothyroidism - Primary    Check TSH      Relevant Orders   Comprehensive metabolic panel   Ammonia   Lithium level     Other   Bipolar depression ([place])   Relevant Orders   Ammonia   Lithium level   Bowel habit changes    S/p colon 06/2022 Imodium prn      Loss of weight    Resolved         No orders of the defined types were placed in this encounter.     Follow-up: Return in about 6 months (around 02/02/2023) for Wellness Exam.  Walker Kehr, MD

## 2022-08-04 NOTE — Assessment & Plan Note (Signed)
Resolved

## 2022-08-04 NOTE — Assessment & Plan Note (Signed)
Check TSH 

## 2022-08-04 NOTE — Assessment & Plan Note (Signed)
S/p colon 06/2022 Imodium prn

## 2022-08-05 LAB — LITHIUM LEVEL: Lithium Lvl: 0.4 mmol/L — ABNORMAL LOW (ref 0.6–1.2)

## 2022-10-21 ENCOUNTER — Other Ambulatory Visit: Payer: Self-pay | Admitting: Internal Medicine

## 2022-12-15 ENCOUNTER — Other Ambulatory Visit: Payer: Self-pay | Admitting: Internal Medicine

## 2023-02-02 ENCOUNTER — Ambulatory Visit (INDEPENDENT_AMBULATORY_CARE_PROVIDER_SITE_OTHER): Payer: Federal, State, Local not specified - PPO | Admitting: Internal Medicine

## 2023-02-02 ENCOUNTER — Encounter: Payer: Self-pay | Admitting: Internal Medicine

## 2023-02-02 VITALS — BP 120/72 | HR 79 | Temp 98.0°F | Ht 72.0 in | Wt 177.0 lb

## 2023-02-02 DIAGNOSIS — E785 Hyperlipidemia, unspecified: Secondary | ICD-10-CM | POA: Diagnosis not present

## 2023-02-02 DIAGNOSIS — Z0000 Encounter for general adult medical examination without abnormal findings: Secondary | ICD-10-CM

## 2023-02-02 DIAGNOSIS — E039 Hypothyroidism, unspecified: Secondary | ICD-10-CM

## 2023-02-02 DIAGNOSIS — F312 Bipolar disorder, current episode manic severe with psychotic features: Secondary | ICD-10-CM

## 2023-02-02 DIAGNOSIS — F319 Bipolar disorder, unspecified: Secondary | ICD-10-CM

## 2023-02-02 DIAGNOSIS — C61 Malignant neoplasm of prostate: Secondary | ICD-10-CM | POA: Diagnosis not present

## 2023-02-02 LAB — CBC WITH DIFFERENTIAL/PLATELET
Basophils Absolute: 0.1 10*3/uL (ref 0.0–0.1)
Basophils Relative: 1.9 % (ref 0.0–3.0)
Eosinophils Absolute: 0.5 10*3/uL (ref 0.0–0.7)
Eosinophils Relative: 9.4 % — ABNORMAL HIGH (ref 0.0–5.0)
HCT: 46.3 % (ref 39.0–52.0)
Hemoglobin: 15.4 g/dL (ref 13.0–17.0)
Lymphocytes Relative: 25.1 % (ref 12.0–46.0)
Lymphs Abs: 1.3 10*3/uL (ref 0.7–4.0)
MCHC: 33.2 g/dL (ref 30.0–36.0)
MCV: 95.6 fl (ref 78.0–100.0)
Monocytes Absolute: 0.3 10*3/uL (ref 0.1–1.0)
Monocytes Relative: 6.5 % (ref 3.0–12.0)
Neutro Abs: 2.9 10*3/uL (ref 1.4–7.7)
Neutrophils Relative %: 57.1 % (ref 43.0–77.0)
Platelets: 210 10*3/uL (ref 150.0–400.0)
RBC: 4.85 Mil/uL (ref 4.22–5.81)
RDW: 12.8 % (ref 11.5–15.5)
WBC: 5 10*3/uL (ref 4.0–10.5)

## 2023-02-02 LAB — LIPID PANEL
Cholesterol: 199 mg/dL (ref 0–200)
HDL: 39.8 mg/dL
LDL Cholesterol: 125 mg/dL — ABNORMAL HIGH (ref 0–99)
NonHDL: 159.42
Total CHOL/HDL Ratio: 5
Triglycerides: 171 mg/dL — ABNORMAL HIGH (ref 0.0–149.0)
VLDL: 34.2 mg/dL (ref 0.0–40.0)

## 2023-02-02 LAB — COMPREHENSIVE METABOLIC PANEL
ALT: 10 U/L (ref 0–53)
AST: 18 U/L (ref 0–37)
Albumin: 4.8 g/dL (ref 3.5–5.2)
Alkaline Phosphatase: 63 U/L (ref 39–117)
BUN: 15 mg/dL (ref 6–23)
CO2: 25 mEq/L (ref 19–32)
Calcium: 10.2 mg/dL (ref 8.4–10.5)
Chloride: 106 mEq/L (ref 96–112)
Creatinine, Ser: 0.99 mg/dL (ref 0.40–1.50)
GFR: 79.77 mL/min (ref >60.00)
Glucose, Bld: 92 mg/dL (ref 70–99)
Potassium: 4.3 mEq/L (ref 3.5–5.1)
Sodium: 140 mEq/L (ref 135–145)
Total Bilirubin: 0.8 mg/dL (ref 0.2–1.2)
Total Protein: 6.6 g/dL (ref 6.0–8.3)

## 2023-02-02 LAB — PSA: PSA: 0 ng/mL — ABNORMAL LOW (ref 0.10–4.00)

## 2023-02-02 LAB — URINALYSIS
Bilirubin Urine: NEGATIVE
Hgb urine dipstick: NEGATIVE
Ketones, ur: NEGATIVE
Leukocytes,Ua: NEGATIVE
Nitrite: NEGATIVE
Specific Gravity, Urine: 1.015 (ref 1.000–1.030)
Total Protein, Urine: NEGATIVE
Urine Glucose: NEGATIVE
Urobilinogen, UA: 0.2 (ref 0.0–1.0)
pH: 7 (ref 5.0–8.0)

## 2023-02-02 LAB — TSH: TSH: 1.61 u[IU]/mL (ref 0.35–5.50)

## 2023-02-02 MED ORDER — LEVOTHYROXINE SODIUM 50 MCG PO TABS: ORAL_TABLET | ORAL | 3 refills | Status: DC

## 2023-02-02 NOTE — Assessment & Plan Note (Signed)
Got married in April Doing well

## 2023-02-02 NOTE — Assessment & Plan Note (Signed)
Check TSH 

## 2023-02-02 NOTE — Assessment & Plan Note (Signed)
We discussed age appropriate health related issues, including available/recomended screening tests and vaccinations. We discussed a need for adhering to healthy diet and exercise. Labs/EKG were reviewed/ordered. All questions were answered.  Colon 2025 due in 2035  Rectal w/Dr Mariane Duval

## 2023-02-02 NOTE — Progress Notes (Signed)
Subjective:  Patient ID: Adam Keller, male    DOB: Jan 11, 1956  Age: 67 y.o. MRN: [id number]  CC: Follow-up (6 MNTH F/U)   HPI Adam Keller presents for a well exam  Outpatient Medications Prior to Visit  Medication Sig Dispense Refill   lamoTRIgine (LAMICTAL) 200 MG tablet Take 1 tablet (200 mg total) by mouth at bedtime. 30 tablet 1   lithium carbonate 600 MG capsule Take 1 capsule (600 mg total) by mouth 2 (two) times daily with a meal. 60 capsule 1   mometasone (NASONEX) 50 MCG/ACT nasal spray SHAKE LIQUID AND USE 2 SPRAYS IN EACH NOSTRIL DAILY 51 g 3   montelukast (SINGULAIR) 10 MG tablet Take 1 tablet (10 mg total) by mouth daily. Keep appt in July for future refills 90 tablet 0   OLANZapine (ZYPREXA) 7.5 MG tablet Take 7.5 mg by mouth in the morning and at bedtime.     levothyroxine (SYNTHROID) 50 MCG tablet TAKE 1 AND 1/2 TABLETS(75 MCG) BY MOUTH DAILY BEFORE BREAKFAST 135 tablet 3   omeprazole (PRILOSEC) 40 MG capsule Take 1 capsule (40 mg total) by mouth daily as needed. (Patient not taking: Reported on 06/26/2022) 30 capsule 2   sildenafil (VIAGRA) 100 MG tablet SMARTSIG:1 Tablet(s) By Mouth As Directed PRN (Patient not taking: Reported on 06/26/2022)     No facility-administered medications prior to visit.    ROS: Review of Systems  Constitutional:  Negative for appetite change, fatigue and unexpected weight change.  HENT:  Negative for congestion, nosebleeds, sneezing, sore throat and trouble swallowing.   Eyes:  Negative for itching and visual disturbance.  Respiratory:  Negative for cough.   Cardiovascular:  Negative for chest pain, palpitations and leg swelling.  Gastrointestinal:  Negative for abdominal distention, blood in stool, diarrhea and nausea.  Genitourinary:  Negative for frequency and hematuria.  Musculoskeletal:  Negative for back pain, gait problem, joint swelling and neck pain.  Skin:  Negative for rash.  Neurological:  Negative for dizziness,  tremors, speech difficulty and weakness.  Psychiatric/Behavioral:  Negative for agitation, dysphoric mood, sleep disturbance and suicidal ideas. The patient is not nervous/anxious.     Objective:  BP 120/72 (BP Location: Right Arm, Patient Position: Sitting, Cuff Size: Large)   Pulse 79   Temp 98 F (36.7 C) (Oral)   Ht 6' (1.829 m)   Wt 177 lb (80.3 kg)   SpO2 96%   BMI 24.01 kg/m   BP Readings from Last 3 Encounters:  02/02/23 120/72  08/04/22 120/78  06/26/22 95/71    Wt Readings from Last 3 Encounters:  02/02/23 177 lb (80.3 kg)  08/04/22 188 lb 3.2 oz (85.4 kg)  06/26/22 181 lb (82.1 kg)    Physical Exam Constitutional:      General: He is not in acute distress.    Appearance: He is well-developed.     Comments: NAD  Eyes:     Conjunctiva/sclera: Conjunctivae normal.     Pupils: Pupils are equal, round, and reactive to light.  Neck:     Thyroid: No thyromegaly.     Vascular: No JVD.  Cardiovascular:     Rate and Rhythm: Normal rate and regular rhythm.     Heart sounds: Normal heart sounds. No murmur heard.    No friction rub. No gallop.  Pulmonary:     Effort: Pulmonary effort is normal. No respiratory distress.     Breath sounds: Normal breath sounds. No wheezing or rales.  Chest:  Chest wall: No tenderness.  Abdominal:     General: Bowel sounds are normal. There is no distension.     Palpations: Abdomen is soft. There is no mass.     Tenderness: There is no abdominal tenderness. There is no guarding or rebound.  Musculoskeletal:        General: No tenderness. Normal range of motion.     Cervical back: Normal range of motion.  Lymphadenopathy:     Cervical: No cervical adenopathy.  Skin:    General: Skin is warm and dry.     Findings: No rash.  Neurological:     Mental Status: He is alert and oriented to person, place, and time.     Cranial Nerves: No cranial nerve deficit.     Motor: No abnormal muscle tone.     Coordination: Coordination  normal.     Gait: Gait normal.     Deep Tendon Reflexes: Reflexes are normal and symmetric.  Psychiatric:        Behavior: Behavior normal.        Thought Content: Thought content normal.        Judgment: Judgment normal.   Rectal - per GI  Lab Results  Component Value Date   WBC 4.2 10/16/2021   HGB 15.7 10/16/2021   HCT 44.9 10/16/2021   PLT 185.0 10/16/2021   GLUCOSE 94 08/04/2022   CHOL 213 (H) 10/16/2021   TRIG 105.0 10/16/2021   HDL 44.40 10/16/2021   LDLDIRECT 116.5 01/02/2011   LDLCALC 147 (H) 10/16/2021   ALT 19 08/04/2022   AST 23 08/04/2022   NA 139 08/04/2022   K 4.1 08/04/2022   CL 105 08/04/2022   CREATININE 1.01 08/04/2022   BUN 14 08/04/2022   CO2 26 08/04/2022   TSH 1.14 10/16/2021   PSA 0.00 Repeated and verified X2. (L) 04/15/2021   HGBA1C 4.7 (L) 12/03/2017    No results found.  Assessment & Plan:   Problem List Items Addressed This Visit     Hypothyroidism    Check TSH      Relevant Medications   levothyroxine (SYNTHROID) 50 MCG tablet   Dyslipidemia   Relevant Orders   TSH   Lipid panel   Bipolar depression (HCC) - Primary    Got married in April Doing well      Well adult exam    We discussed age appropriate health related issues, including available/recomended screening tests and vaccinations. We discussed a need for adhering to healthy diet and exercise. Labs/EKG were reviewed/ordered. All questions were answered.  Colon 2025 due in 2035  Rectal w/Dr Laverle Patter [facility]      Relevant Orders   TSH   Urinalysis   CBC with Differential/Platelet   Lipid panel   PSA   Comprehensive metabolic panel   Bipolar affective disorder, current episode manic with psychotic symptoms (HCC)    [facility]      Prostate cancer (HCC)    F/u w/Dr Laverle Patter      Relevant Orders   PSA      Meds ordered this encounter  Medications   levothyroxine (SYNTHROID) 50 MCG tablet    Sig: TAKE 1 AND 1/2 TABLETS(75 MCG) BY MOUTH DAILY  BEFORE BREAKFAST    Dispense:  135 tablet    Refill:  3      Follow-up: Return in about 6 months (around 08/05/2023) for a follow-up visit.  Sonda Primes, MD

## 2023-02-02 NOTE — Assessment & Plan Note (Signed)
[facility]

## 2023-02-02 NOTE — Assessment & Plan Note (Signed)
F/u w/Dr Borden 

## 2023-03-19 ENCOUNTER — Other Ambulatory Visit: Payer: Self-pay | Admitting: Internal Medicine

## 2023-04-26 ENCOUNTER — Other Ambulatory Visit: Payer: Self-pay | Admitting: Internal Medicine

## 2023-04-28 ENCOUNTER — Other Ambulatory Visit: Payer: Self-pay | Admitting: Internal Medicine

## 2023-07-28 DIAGNOSIS — J988 Other specified respiratory disorders: Secondary | ICD-10-CM | POA: Insufficient documentation

## 2023-07-28 DIAGNOSIS — K1379 Other lesions of oral mucosa: Secondary | ICD-10-CM | POA: Insufficient documentation

## 2023-08-05 ENCOUNTER — Encounter: Payer: Self-pay | Admitting: Internal Medicine

## 2023-08-05 ENCOUNTER — Ambulatory Visit: Payer: Federal, State, Local not specified - PPO | Admitting: Internal Medicine

## 2023-08-05 VITALS — BP 122/70 | HR 96 | Temp 98.0°F | Ht 72.0 in | Wt 170.0 lb

## 2023-08-05 DIAGNOSIS — E039 Hypothyroidism, unspecified: Secondary | ICD-10-CM

## 2023-08-05 DIAGNOSIS — C61 Malignant neoplasm of prostate: Secondary | ICD-10-CM | POA: Diagnosis not present

## 2023-08-05 DIAGNOSIS — C029 Malignant neoplasm of tongue, unspecified: Secondary | ICD-10-CM | POA: Diagnosis not present

## 2023-08-05 DIAGNOSIS — F319 Bipolar disorder, unspecified: Secondary | ICD-10-CM | POA: Diagnosis not present

## 2023-08-05 LAB — VITAMIN B12: Vitamin B-12: 731 pg/mL (ref 211–911)

## 2023-08-05 LAB — T3, FREE: T3, Free: 2.9 pg/mL (ref 2.3–4.2)

## 2023-08-05 LAB — T4, FREE: Free T4: 0.78 ng/dL (ref 0.60–1.60)

## 2023-08-05 LAB — TSH: TSH: 0.46 u[IU]/mL (ref 0.35–5.50)

## 2023-08-05 LAB — VITAMIN D 25 HYDROXY (VIT D DEFICIENCY, FRACTURES): VITD: 61.35 ng/mL (ref 30.00–100.00)

## 2023-08-05 MED ORDER — TRIAMCINOLONE ACETONIDE 0.1 % MT PSTE: 1.0000 | PASTE | Freq: Three times a day (TID) | OROMUCOSAL | 2 refills | Status: DC

## 2023-08-05 NOTE — Progress Notes (Signed)
Subjective:  Patient ID: KARCYN ESCARENO, male    DOB: 02/24/1956  Age: 68 y.o. MRN: [id number]  CC: Medical Management of Chronic Issues (6 mnth f/u)   HPI Adam Keller presents for hypothyroidism, allergies Tongue cancer relapsed in Nov 2024 - s/p another surgery, on XRT and Chemo now   Outpatient Medications Prior to Visit  Medication Sig Dispense Refill   fluconazole (DIFLUCAN) 100 MG tablet Take by mouth.     lamoTRIgine (LAMICTAL) 200 MG tablet Take 1 tablet (200 mg total) by mouth at bedtime. 30 tablet 1   levothyroxine (SYNTHROID) 50 MCG tablet TAKE 1 AND 1/2 TABLETS(75 MCG) BY MOUTH DAILY BEFORE BREAKFAST 135 tablet 3   lidocaine (XYLOCAINE) 2 % solution Swish and swallow 5 mLs 4 (four) times daily as needed     lithium carbonate 600 MG capsule Take 1 capsule (600 mg total) by mouth 2 (two) times daily with a meal. 60 capsule 1   mometasone (NASONEX) 50 MCG/ACT nasal spray SHAKE LIQUID AND USE 2 SPRAYS IN EACH NOSTRIL DAILY 51 g 3   montelukast (SINGULAIR) 10 MG tablet TAKE 1 TABLET(10 MG) BY MOUTH DAILY 90 tablet 3   nystatin (MYCOSTATIN) 100000 UNIT/ML suspension Take 5 mLs by mouth 4 (four) times daily.     OLANZapine (ZYPREXA) 7.5 MG tablet Take 7.5 mg by mouth in the morning and at bedtime.     ondansetron (ZOFRAN) 8 MG tablet Take 8 mg by mouth 3 (three) times daily.     oxyCODONE (OXY IR/ROXICODONE) 5 MG immediate release tablet Take 5-10 mg by mouth every 6 (six) hours as needed.     polyethylene glycol (MIRALAX / GLYCOLAX) 17 g packet Take by mouth.     senna-docusate (SENOKOT-S) 8.6-50 MG tablet Take by mouth.     omeprazole (PRILOSEC) 40 MG capsule Take 1 capsule (40 mg total) by mouth daily as needed. (Patient not taking: Reported on 08/05/2023) 30 capsule 2   sildenafil (VIAGRA) 100 MG tablet SMARTSIG:1 Tablet(s) By Mouth As Directed PRN (Patient not taking: Reported on 08/05/2023)     No facility-administered medications prior to visit.    ROS: Review of Systems   Constitutional:  Positive for fatigue. Negative for appetite change and unexpected weight change.  HENT:  Negative for congestion, nosebleeds, sneezing, sore throat and trouble swallowing.   Eyes:  Negative for itching and visual disturbance.  Respiratory:  Negative for cough.   Cardiovascular:  Negative for chest pain, palpitations and leg swelling.  Gastrointestinal:  Negative for abdominal distention, blood in stool, diarrhea and nausea.  Genitourinary:  Negative for frequency and hematuria.  Musculoskeletal:  Negative for back pain, gait problem, joint swelling and neck pain.  Skin:  Positive for color change and rash.  Neurological:  Negative for dizziness, tremors, speech difficulty and weakness.  Psychiatric/Behavioral:  Negative for agitation, dysphoric mood and sleep disturbance. The patient is not nervous/anxious.     Objective:  BP 122/70 (BP Location: Right Arm, Patient Position: Sitting, Cuff Size: Normal)   Pulse 96   Temp 98 F (36.7 C) (Oral)   Ht 6' (1.829 m)   Wt 170 lb (77.1 kg)   SpO2 97%   BMI 23.06 kg/m   BP Readings from Last 3 Encounters:  08/05/23 122/70  02/02/23 120/72  08/04/22 120/78    Wt Readings from Last 3 Encounters:  08/05/23 170 lb (77.1 kg)  02/02/23 177 lb (80.3 kg)  08/04/22 188 lb 3.2 oz (85.4 kg)  Physical Exam Constitutional:      General: He is not in acute distress.    Appearance: He is well-developed. He is not toxic-appearing.     Comments: NAD  Eyes:     Conjunctiva/sclera: Conjunctivae normal.     Pupils: Pupils are equal, round, and reactive to light.  Neck:     Thyroid: No thyromegaly.     Vascular: No JVD.  Cardiovascular:     Rate and Rhythm: Normal rate and regular rhythm.     Heart sounds: Normal heart sounds. No murmur heard.    No friction rub. No gallop.  Pulmonary:     Effort: Pulmonary effort is normal. No respiratory distress.     Breath sounds: Normal breath sounds. No wheezing or rales.  Chest:      Chest wall: No tenderness.  Abdominal:     General: Bowel sounds are normal. There is no distension.     Palpations: Abdomen is soft. There is no mass.     Tenderness: There is no abdominal tenderness. There is no guarding or rebound.  Musculoskeletal:        General: No tenderness. Normal range of motion.     Cervical back: Normal range of motion.  Lymphadenopathy:     Cervical: No cervical adenopathy.  Skin:    General: Skin is warm and dry.     Findings: Erythema present. No lesion or rash.  Neurological:     Mental Status: He is alert and oriented to person, place, and time.     Cranial Nerves: No cranial nerve deficit.     Motor: No abnormal muscle tone.     Coordination: Coordination normal.     Gait: Gait normal.     Deep Tendon Reflexes: Reflexes are normal and symmetric.  Psychiatric:        Behavior: Behavior normal.        Thought Content: Thought content normal.        Judgment: Judgment normal.    Lower face and neck w/erythema Thrush  Lab Results  Component Value Date   WBC 5.0 02/02/2023   HGB 15.4 02/02/2023   HCT 46.3 02/02/2023   PLT 210.0 02/02/2023   GLUCOSE 92 02/02/2023   CHOL 199 02/02/2023   TRIG 171.0 (H) 02/02/2023   HDL 39.80 02/02/2023   LDLDIRECT 116.5 01/02/2011   LDLCALC 125 (H) 02/02/2023   ALT 10 02/02/2023   AST 18 02/02/2023   NA 140 02/02/2023   K 4.3 02/02/2023   CL 106 02/02/2023   CREATININE 0.99 02/02/2023   BUN 15 02/02/2023   CO2 25 02/02/2023   TSH 1.61 02/02/2023   PSA 0.00 (L) 02/02/2023   HGBA1C 4.7 (L) 12/03/2017    No results found.  Assessment & Plan:   Problem List Items Addressed This Visit     Hypothyroidism - Primary   Chronic Cont on Levothroid Check FT4 and FT3      Relevant Orders   TSH   T3, free   T4, free   Vitamin B12   VITAMIN D 25 Hydroxy (Vit-D Deficiency, Fractures)   Lithium level   Bipolar depression (HCC)    Doing fair: Tongue cancer relapsed in Nov 2024 - s/p another  surgery, on XRT and Chemo now. Discussed      Relevant Orders   TSH   T3, free   T4, free   Vitamin B12   VITAMIN D 25 Hydroxy (Vit-D Deficiency, Fractures)   Lithium level   Squamous  cell cancer of tongue (HCC)   Tongue cancer relapsed in Nov 2024 - s/p another surgery, on XRT and Chemo now      Relevant Medications   fluconazole (DIFLUCAN) 100 MG tablet   ondansetron (ZOFRAN) 8 MG tablet   Other Relevant Orders   TSH   T3, free   T4, free   Vitamin B12   VITAMIN D 25 Hydroxy (Vit-D Deficiency, Fractures)   Lithium level   Prostate cancer (HCC)   F/u w/Dr Laverle Patter. PSA was ok      Relevant Medications   fluconazole (DIFLUCAN) 100 MG tablet   ondansetron (ZOFRAN) 8 MG tablet      Meds ordered this encounter  Medications   triamcinolone (KENALOG) 0.1 % paste    Sig: Use as directed 1 Application in the mouth or throat 3 (three) times daily. On canker sores    Dispense:  5 g    Refill:  2      Follow-up: Return in about 3 months (around 11/03/2023) for a follow-up visit.  Sonda Primes, MD

## 2023-08-05 NOTE — Patient Instructions (Signed)
 Marland Kitchen

## 2023-08-05 NOTE — Assessment & Plan Note (Signed)
F/u w/Dr Laverle Patter. PSA was ok

## 2023-08-05 NOTE — Assessment & Plan Note (Signed)
Tongue cancer relapsed in Nov 2024 - s/p another surgery, on XRT and Chemo now

## 2023-08-05 NOTE — Assessment & Plan Note (Signed)
  Doing fair: Tongue cancer relapsed in Nov 2024 - s/p another surgery, on XRT and Chemo now. Discussed

## 2023-08-05 NOTE — Assessment & Plan Note (Addendum)
Chronic Cont on Levothroid Check FT4 and FT3

## 2023-08-06 ENCOUNTER — Encounter: Payer: Self-pay | Admitting: Internal Medicine

## 2023-08-06 LAB — LITHIUM LEVEL: Lithium Lvl: 0.3 mmol/L — ABNORMAL LOW (ref 0.6–1.2)

## 2023-11-03 ENCOUNTER — Ambulatory Visit (INDEPENDENT_AMBULATORY_CARE_PROVIDER_SITE_OTHER): Payer: Federal, State, Local not specified - PPO | Admitting: Internal Medicine

## 2023-11-03 ENCOUNTER — Encounter: Payer: Self-pay | Admitting: Internal Medicine

## 2023-11-03 VITALS — BP 116/70 | HR 97 | Temp 98.3°F | Ht 72.0 in | Wt 163.0 lb

## 2023-11-03 DIAGNOSIS — R634 Abnormal weight loss: Secondary | ICD-10-CM | POA: Diagnosis not present

## 2023-11-03 DIAGNOSIS — E039 Hypothyroidism, unspecified: Secondary | ICD-10-CM | POA: Diagnosis not present

## 2023-11-03 DIAGNOSIS — K148 Other diseases of tongue: Secondary | ICD-10-CM | POA: Diagnosis not present

## 2023-11-03 DIAGNOSIS — C029 Malignant neoplasm of tongue, unspecified: Secondary | ICD-10-CM

## 2023-11-03 DIAGNOSIS — F319 Bipolar disorder, unspecified: Secondary | ICD-10-CM

## 2023-11-03 NOTE — Assessment & Plan Note (Signed)
  Doing fair: Tongue cancer relapsed in Nov 2024 - s/p another surgery, on XRT and Chemo now. Discussed

## 2023-11-03 NOTE — Patient Instructions (Signed)
 Empty Gelatin Capsules, Clear      A "rice sock heating pad" refers to a homemade heating pad created by filling a sock with uncooked rice, which can be heated in a microwave to provide a warm compress for sore muscles, pain relief, or other applications; essentially, it's a simple way to generate heat using readily available materials.  Key points about rice sock heat: How to make it: Fill a clean sock (preferably a tube sock) about 2/3 full with uncooked rice, tie a knot at the top to secure the rice inside.  Heating it up: Place the rice sock in the microwave and heat in short intervals (usually around 30 seconds at a time) until it reaches the desired warmth.  Important considerations: Check temperature before applying: Always test the temperature of the rice sock before applying it to your skin to avoid burns.  Use a towel to protect skin: Wrap the rice sock in a thin towel to distribute the heat evenly and protect your skin.  Uses: Muscle aches and pains  Menstrual cramps  Neck pain  Arthritis discomfort

## 2023-11-03 NOTE — Progress Notes (Signed)
 Subjective:  Patient ID: Adam Keller, male    DOB: 1956-01-20  Age: 68 y.o. MRN: [id number]  CC: Medical Management of Chronic Issues (3 Month follow up)   HPI LOMAN LOGAN presents for [facility] relapse (s/p XRT, chemo)  Hard to swallow  No sense of  taste at all   Per Garnett Justice, NP :"Recurrent SCC of R tongue. Initial diagnostic FNA of R tongue which showed SCC. Preoperative CT which showed postsurgical changes of right partial glossectomy. New 2 cm focus at right lateral tongue suspicious for recurrent disease. Mildly hypermetabolic right parotid node may represent nodal metastasis. No other signs of disease activity. Right partial glossectomy and lymph node dissection. Postop pathology which showed invasive SCC no LVI, no PNI tumor bed margin positive. 1/2 LN positive for [facility] [pT3 N1]   Completed concurrent chemoradiation with weekly cisplatin.  -Radiation with Dr. Christia Cowboy 07/06/2023-08/24/2023.  -Concurrent chemotherapy with weekly cisplatin. Received 7 weekly treatments, last was 08/18/2023.  2- Cancer related pain: Worsening tongue pain, see R tongue ulceration below. Lidocaine  helpful. Will try doxepin.  3- Clinical trials: No clinical trial options available at this time. 4- Nutrition: Patient has no swallowing issues. Continues mostly liquid diet. We will continue to monitor at this time. 5-Thick salvia/mucous. Improving. 6-Thrush. Nystatin as directed. 7-R tongue ulceration. Feels like tooth rubbing on tooth. Previously given dental wax and doxepin. Can continue lidocaine . Will follow up with surgery team for evaluation. 8-Hypercalcemia. Slightly elevated calcium 10.3. Not on calcium supplement. Can be related to fluid status with increasing mouth pain/not adequate fluid intake. "   Outpatient Medications Prior to Visit  Medication Sig Dispense Refill   lamoTRIgine  (LAMICTAL ) 200 MG tablet Take 1 tablet (200 mg total) by mouth at bedtime. 30 tablet 1   levothyroxine   (SYNTHROID ) 50 MCG tablet TAKE 1 AND 1/2 TABLETS(75 MCG) BY MOUTH DAILY BEFORE BREAKFAST 135 tablet 3   lithium  carbonate 600 MG capsule Take 1 capsule (600 mg total) by mouth 2 (two) times daily with a meal. 60 capsule 1   montelukast  (SINGULAIR ) 10 MG tablet TAKE 1 TABLET(10 MG) BY MOUTH DAILY 90 tablet 3   OLANZapine  (ZYPREXA ) 7.5 MG tablet Take 7.5 mg by mouth in the morning and at bedtime.     omeprazole  (PRILOSEC) 40 MG capsule Take 1 capsule (40 mg total) by mouth daily as needed. (Patient not taking: Reported on 06/26/2022) 30 capsule 2   sildenafil (VIAGRA) 100 MG tablet SMARTSIG:1 Tablet(s) By Mouth As Directed PRN (Patient not taking: Reported on 06/26/2022)     lidocaine  (XYLOCAINE ) 2 % solution Swish and swallow 5 mLs 4 (four) times daily as needed     mometasone  (NASONEX ) 50 MCG/ACT nasal spray SHAKE LIQUID AND USE 2 SPRAYS IN EACH NOSTRIL DAILY 51 g 3   nystatin (MYCOSTATIN) 100000 UNIT/ML suspension Take 5 mLs by mouth 4 (four) times daily.     ondansetron  (ZOFRAN ) 8 MG tablet Take 8 mg by mouth 3 (three) times daily.     oxyCODONE  (OXY IR/ROXICODONE ) 5 MG immediate release tablet Take 5-10 mg by mouth every 6 (six) hours as needed.     polyethylene glycol (MIRALAX / GLYCOLAX) 17 g packet Take by mouth.     senna-docusate (SENOKOT-S) 8.6-50 MG tablet Take by mouth.     triamcinolone  (KENALOG ) 0.1 % paste Use as directed 1 Application in the mouth or throat 3 (three) times daily. On canker sores 5 g 2   No facility-administered medications prior to visit.  ROS: Review of Systems  Constitutional:  Positive for fatigue. Negative for appetite change and unexpected weight change.  HENT:  Positive for congestion. Negative for nosebleeds, sneezing, sore throat and trouble swallowing.   Eyes:  Negative for itching and visual disturbance.  Respiratory:  Negative for cough.   Cardiovascular:  Negative for chest pain, palpitations and leg swelling.  Gastrointestinal:  Negative for  abdominal distention, blood in stool, diarrhea and nausea.  Genitourinary:  Negative for frequency and hematuria.  Musculoskeletal:  Negative for back pain, gait problem, joint swelling and neck pain.  Skin:  Negative for rash.  Neurological:  Negative for dizziness, tremors, speech difficulty and weakness.  Psychiatric/Behavioral:  Negative for agitation, dysphoric mood, sleep disturbance and suicidal ideas. The patient is not nervous/anxious.     Objective:  BP 116/70   Pulse 97   Temp 98.3 F (36.8 C)   Ht 6' (1.829 m)   Wt 163 lb (73.9 kg)   SpO2 98%   BMI 22.11 kg/m   BP Readings from Last 3 Encounters:  11/03/23 116/70  08/05/23 122/70  02/02/23 120/72    Wt Readings from Last 3 Encounters:  11/03/23 163 lb (73.9 kg)  08/05/23 170 lb (77.1 kg)  02/02/23 177 lb (80.3 kg)    Physical Exam Constitutional:      General: He is not in acute distress.    Appearance: Normal appearance. He is well-developed.     Comments: NAD  Eyes:     Conjunctiva/sclera: Conjunctivae normal.     Pupils: Pupils are equal, round, and reactive to light.  Neck:     Thyroid : No thyromegaly.     Vascular: No JVD.  Cardiovascular:     Rate and Rhythm: Normal rate and regular rhythm.     Heart sounds: Normal heart sounds. No murmur heard.    No friction rub. No gallop.  Pulmonary:     Effort: Pulmonary effort is normal. No respiratory distress.     Breath sounds: Normal breath sounds. No wheezing or rales.  Chest:     Chest wall: No tenderness.  Abdominal:     General: Bowel sounds are normal. There is no distension.     Palpations: Abdomen is soft. There is no mass.     Tenderness: There is no abdominal tenderness. There is no guarding or rebound.  Musculoskeletal:        General: No tenderness. Normal range of motion.     Cervical back: Normal range of motion.  Lymphadenopathy:     Cervical: No cervical adenopathy.  Skin:    General: Skin is warm and dry.     Findings: No rash.   Neurological:     Mental Status: He is alert and oriented to person, place, and time.     Cranial Nerves: No cranial nerve deficit.     Motor: No abnormal muscle tone.     Coordination: Coordination normal.     Gait: Gait normal.     Deep Tendon Reflexes: Reflexes are normal and symmetric.  Psychiatric:        Behavior: Behavior normal.        Thought Content: Thought content normal.        Judgment: Judgment normal.    L tongue ulcer x1 Neck focal swelling Tongue is deformed Thin   Lab Results  Component Value Date   WBC 5.0 02/02/2023   HGB 15.4 02/02/2023   HCT 46.3 02/02/2023   PLT 210.0 02/02/2023   GLUCOSE 92  02/02/2023   CHOL 199 02/02/2023   TRIG 171.0 (H) 02/02/2023   HDL 39.80 02/02/2023   LDLDIRECT 116.5 01/02/2011   LDLCALC 125 (H) 02/02/2023   ALT 10 02/02/2023   AST 18 02/02/2023   NA 140 02/02/2023   K 4.3 02/02/2023   CL 106 02/02/2023   CREATININE 0.99 02/02/2023   BUN 15 02/02/2023   CO2 25 02/02/2023   TSH 0.46 08/05/2023   PSA 0.00 (L) 02/02/2023   HGBA1C 4.7 (L) 12/03/2017    No results found.  Assessment & Plan:   Problem List Items Addressed This Visit     Hypothyroidism   Put synthroid  in clear gel caps      Bipolar depression (HCC)    Doing fair: Tongue cancer relapsed in Nov 2024 - s/p another surgery, on XRT and Chemo now. Discussed      Loss of weight   Per Garnett Justice, NP :"Recurrent SCC of R tongue. Initial diagnostic FNA of R tongue which showed SCC. Preoperative CT which showed postsurgical changes of right partial glossectomy. New 2 cm focus at right lateral tongue suspicious for recurrent disease. Mildly hypermetabolic right parotid node may represent nodal metastasis. No other signs of disease activity. Right partial glossectomy and lymph node dissection. Postop pathology which showed invasive SCC no LVI, no PNI tumor bed margin positive. 1/2 LN positive for [facility] [pT3 N1]   Completed concurrent chemoradiation with  weekly cisplatin.  -Radiation with Dr. Christia Cowboy 07/06/2023-08/24/2023.  -Concurrent chemotherapy with weekly cisplatin. Received 7 weekly treatments, last was 08/18/2023.  2- Cancer related pain: Worsening tongue pain, see R tongue ulceration below. Lidocaine  helpful. Will try doxepin.  3- Clinical trials: No clinical trial options available at this time. 4- Nutrition: Patient has no swallowing issues. Continues mostly liquid diet. We will continue to monitor at this time. 5-Thick salvia/mucous. Improving. 6-Thrush. Nystatin as directed. 7-R tongue ulceration. Feels like tooth rubbing on tooth. Previously given dental wax and doxepin. Can continue lidocaine . Will follow up with surgery team for evaluation. 8-Hypercalcemia. Slightly elevated calcium 10.3. Not on calcium supplement. Can be related to fluid status with increasing mouth pain/not adequate fluid intake. "      Tongue lesion - Primary   Per Garnett Justice, NP :"Recurrent SCC of R tongue. Initial diagnostic FNA of R tongue which showed SCC. Preoperative CT which showed postsurgical changes of right partial glossectomy. New 2 cm focus at right lateral tongue suspicious for recurrent disease. Mildly hypermetabolic right parotid node may represent nodal metastasis. No other signs of disease activity. Right partial glossectomy and lymph node dissection. Postop pathology which showed invasive SCC no LVI, no PNI tumor bed margin positive. 1/2 LN positive for [facility] [pT3 N1]   Completed concurrent chemoradiation with weekly cisplatin.  -Radiation with Dr. Christia Cowboy 07/06/2023-08/24/2023.  -Concurrent chemotherapy with weekly cisplatin. Received 7 weekly treatments, last was 08/18/2023.  2- Cancer related pain: Worsening tongue pain, see R tongue ulceration below. Lidocaine  helpful. Will try doxepin.  3- Clinical trials: No clinical trial options available at this time. 4- Nutrition: Patient has no swallowing issues. Continues mostly liquid diet. We will  continue to monitor at this time. 5-Thick salvia/mucous. Improving. 6-Thrush. Nystatin as directed. 7-R tongue ulceration. Feels like tooth rubbing on tooth. Previously given dental wax and doxepin. Can continue lidocaine . Will follow up with surgery team for evaluation. 8-Hypercalcemia. Slightly elevated calcium 10.3. Not on calcium supplement. Can be related to fluid status with increasing mouth pain/not adequate fluid intake. "  Squamous cell cancer of tongue (HCC)   Put synrthroid in clear gel caps         No orders of the defined types were placed in this encounter.     Follow-up: Return in about 3 months (around 02/02/2024) for a follow-up visit.  Anitra Barn, MD

## 2023-11-03 NOTE — Assessment & Plan Note (Signed)
 Put synrthroid in clear gel caps

## 2023-11-03 NOTE — Assessment & Plan Note (Signed)
 Per Garnett Justice, NP :"Recurrent SCC of R tongue. Initial diagnostic FNA of R tongue which showed SCC. Preoperative CT which showed postsurgical changes of right partial glossectomy. New 2 cm focus at right lateral tongue suspicious for recurrent disease. Mildly hypermetabolic right parotid node may represent nodal metastasis. No other signs of disease activity. Right partial glossectomy and lymph node dissection. Postop pathology which showed invasive SCC no LVI, no PNI tumor bed margin positive. 1/2 LN positive for [facility] [pT3 N1]   Completed concurrent chemoradiation with weekly cisplatin.  -Radiation with Dr. Christia Cowboy 07/06/2023-08/24/2023.  -Concurrent chemotherapy with weekly cisplatin. Received 7 weekly treatments, last was 08/18/2023.  2- Cancer related pain: Worsening tongue pain, see R tongue ulceration below. Lidocaine  helpful. Will try doxepin.  3- Clinical trials: No clinical trial options available at this time. 4- Nutrition: Patient has no swallowing issues. Continues mostly liquid diet. We will continue to monitor at this time. 5-Thick salvia/mucous. Improving. 6-Thrush. Nystatin as directed. 7-R tongue ulceration. Feels like tooth rubbing on tooth. Previously given dental wax and doxepin. Can continue lidocaine . Will follow up with surgery team for evaluation. 8-Hypercalcemia. Slightly elevated calcium 10.3. Not on calcium supplement. Can be related to fluid status with increasing mouth pain/not adequate fluid intake. "

## 2023-11-03 NOTE — Assessment & Plan Note (Signed)
 Put synthroid  in clear gel caps

## 2024-02-02 ENCOUNTER — Ambulatory Visit: Admitting: Internal Medicine

## 2024-02-04 ENCOUNTER — Ambulatory Visit: Admitting: Internal Medicine

## 2024-02-04 ENCOUNTER — Encounter: Payer: Self-pay | Admitting: Internal Medicine

## 2024-02-04 VITALS — BP 120/80 | HR 92 | Temp 98.2°F | Ht 72.0 in | Wt 158.0 lb

## 2024-02-04 DIAGNOSIS — R634 Abnormal weight loss: Secondary | ICD-10-CM

## 2024-02-04 DIAGNOSIS — E039 Hypothyroidism, unspecified: Secondary | ICD-10-CM

## 2024-02-04 DIAGNOSIS — C029 Malignant neoplasm of tongue, unspecified: Secondary | ICD-10-CM

## 2024-02-04 DIAGNOSIS — R7989 Other specified abnormal findings of blood chemistry: Secondary | ICD-10-CM | POA: Diagnosis not present

## 2024-02-04 DIAGNOSIS — F319 Bipolar disorder, unspecified: Secondary | ICD-10-CM

## 2024-02-04 LAB — CBC WITH DIFFERENTIAL/PLATELET
Basophils Absolute: 0 K/uL (ref 0.0–0.1)
Basophils Relative: 0.7 % (ref 0.0–3.0)
Eosinophils Absolute: 0.2 K/uL (ref 0.0–0.7)
Eosinophils Relative: 4.1 % (ref 0.0–5.0)
HCT: 40.6 % (ref 39.0–52.0)
Hemoglobin: 13.8 g/dL (ref 13.0–17.0)
Lymphocytes Relative: 15 % (ref 12.0–46.0)
Lymphs Abs: 0.7 K/uL (ref 0.7–4.0)
MCHC: 34 g/dL (ref 30.0–36.0)
MCV: 95.9 fl (ref 78.0–100.0)
Monocytes Absolute: 0.4 K/uL (ref 0.1–1.0)
Monocytes Relative: 8.7 % (ref 3.0–12.0)
Neutro Abs: 3.4 K/uL (ref 1.4–7.7)
Neutrophils Relative %: 71.5 % (ref 43.0–77.0)
Platelets: 218 K/uL (ref 150.0–400.0)
RBC: 4.23 Mil/uL (ref 4.22–5.81)
RDW: 13.4 % (ref 11.5–15.5)
WBC: 4.7 K/uL (ref 4.0–10.5)

## 2024-02-04 LAB — TSH: TSH: 1.58 u[IU]/mL (ref 0.35–5.50)

## 2024-02-04 LAB — COMPREHENSIVE METABOLIC PANEL WITH GFR
ALT: 7 U/L (ref 0–53)
AST: 15 U/L (ref 0–37)
Albumin: 4.7 g/dL (ref 3.5–5.2)
Alkaline Phosphatase: 71 U/L (ref 39–117)
BUN: 31 mg/dL — ABNORMAL HIGH (ref 6–23)
CO2: 27 meq/L (ref 19–32)
Calcium: 9.9 mg/dL (ref 8.4–10.5)
Chloride: 105 meq/L (ref 96–112)
Creatinine, Ser: 0.81 mg/dL (ref 0.40–1.50)
GFR: 91.68 mL/min (ref >60.00)
Glucose, Bld: 98 mg/dL (ref 70–99)
Potassium: 4.1 meq/L (ref 3.5–5.1)
Sodium: 139 meq/L (ref 135–145)
Total Bilirubin: 0.5 mg/dL (ref 0.2–1.2)
Total Protein: 6.9 g/dL (ref 6.0–8.3)

## 2024-02-04 LAB — T4, FREE: Free T4: 0.7 ng/dL (ref 0.60–1.60)

## 2024-02-04 LAB — AMMONIA: Ammonia: 41 umol/L — ABNORMAL HIGH (ref 11–35)

## 2024-02-04 MED ORDER — LEVOTHYROXINE SODIUM 50 MCG PO TABS
ORAL_TABLET | ORAL | 3 refills | Status: DC
Start: 2024-02-04 — End: 2024-02-25

## 2024-02-04 NOTE — Patient Instructions (Signed)
 Blue-Emu cream -- use 2-3 times a day ? ?

## 2024-02-04 NOTE — Assessment & Plan Note (Signed)
 Put synthroid  in clear gel caps

## 2024-02-04 NOTE — Assessment & Plan Note (Signed)
 Wt Readings from Last 3 Encounters:  02/04/24 158 lb (71.7 kg)  11/03/23 163 lb (73.9 kg)  08/05/23 170 lb (77.1 kg)

## 2024-02-04 NOTE — Assessment & Plan Note (Signed)
It is likely a side effect of lithium therapy. Check lithium level, ammonia

## 2024-02-04 NOTE — Progress Notes (Signed)
 Subjective:  Patient ID: Adam Keller, male    DOB: [birth year]  Age: 68 y.o. MRN: [id number]  CC: Follow-up (3 months f/u)   HPI Adam Keller presents for cancer - SCC, wt loss, hypothyroidism L neck surgery and planning XRT on 02/22/24  Per hx (Dr Allene): : CERVICAL LYMPHADENECTOMY (MODIFIED RADICAL NECK DISSECTION) LARYNGOSCOPY, DIRECT, OPERATIVE, WITH BIOPSY; WITH OPERATING MICROSCOPE OR TELESCOPE   Adam Keller is a 68 y.o. male with PMH of hypothyroidism, bipolar disorder and right oral tongue SCC s/p multiple surgical resections including right partial glossectomy and right SND levels 1-4. He completed adjuvant CRT in February 2025 with post-op CT demonstrating an avid node in the left level 3 neck. FNA confirmed SCC.     Outpatient Medications Prior to Visit  Medication Sig Dispense Refill   lamoTRIgine  (LAMICTAL ) 200 MG tablet Take 1 tablet (200 mg total) by mouth at bedtime. 30 tablet 1   lithium  carbonate 600 MG capsule Take 1 capsule (600 mg total) by mouth 2 (two) times daily with a meal. 60 capsule 1   montelukast  (SINGULAIR ) 10 MG tablet TAKE 1 TABLET(10 MG) BY MOUTH DAILY 90 tablet 3   OLANZapine  (ZYPREXA ) 7.5 MG tablet Take 7.5 mg by mouth in the morning and at bedtime.     omeprazole  (PRILOSEC) 40 MG capsule Take 1 capsule (40 mg total) by mouth daily as needed. (Patient not taking: Reported on 06/26/2022) 30 capsule 2   sildenafil (VIAGRA) 100 MG tablet SMARTSIG:1 Tablet(s) By Mouth As Directed PRN (Patient not taking: Reported on 06/26/2022)     levothyroxine  (SYNTHROID ) 50 MCG tablet TAKE 1 AND 1/2 TABLETS(75 MCG) BY MOUTH DAILY BEFORE BREAKFAST 135 tablet 3   No facility-administered medications prior to visit.    ROS: Review of Systems  Constitutional:  Negative for appetite change, fatigue and unexpected weight change.  HENT:  Positive for drooling, postnasal drip, rhinorrhea, sore throat, trouble swallowing and voice change. Negative for congestion,  nosebleeds, sinus pressure, sneezing and tinnitus.   Eyes:  Negative for itching and visual disturbance.  Respiratory:  Negative for cough.   Cardiovascular:  Negative for chest pain, palpitations and leg swelling.  Gastrointestinal:  Negative for abdominal distention, blood in stool, diarrhea and nausea.  Genitourinary:  Negative for frequency and hematuria.  Musculoskeletal:  Negative for back pain, gait problem, joint swelling and neck pain.  Skin:  Negative for rash.  Neurological:  Negative for dizziness, tremors, speech difficulty and weakness.  Psychiatric/Behavioral:  Negative for agitation, dysphoric mood and sleep disturbance. The patient is not nervous/anxious.     Objective:  BP 120/80 (BP Location: Left Arm, Patient Position: Sitting, Cuff Size: Normal)   Pulse 92   Temp 98.2 F (36.8 C) (Oral)   Ht 6' (1.829 m)   Wt 158 lb (71.7 kg)   SpO2 99%   BMI 21.43 kg/m   BP Readings from Last 3 Encounters:  02/04/24 120/80  11/03/23 116/70  08/05/23 122/70    Wt Readings from Last 3 Encounters:  02/04/24 158 lb (71.7 kg)  11/03/23 163 lb (73.9 kg)  08/05/23 170 lb (77.1 kg)    Physical Exam Constitutional:      General: He is not in acute distress.    Appearance: Normal appearance. He is well-developed.     Comments: NAD  Eyes:     Conjunctiva/sclera: Conjunctivae normal.     Pupils: Pupils are equal, round, and reactive to light.  Neck:     Thyroid : No  thyromegaly.     Vascular: No JVD.  Cardiovascular:     Rate and Rhythm: Normal rate and regular rhythm.     Heart sounds: Normal heart sounds. No murmur heard.    No friction rub. No gallop.  Pulmonary:     Effort: Pulmonary effort is normal. No respiratory distress.     Breath sounds: Normal breath sounds. No wheezing or rales.  Chest:     Chest wall: No tenderness.  Abdominal:     General: Bowel sounds are normal. There is no distension.     Palpations: Abdomen is soft. There is no mass.      Tenderness: There is no abdominal tenderness. There is no guarding or rebound.  Musculoskeletal:        General: No tenderness. Normal range of motion.     Cervical back: Normal range of motion.     Right lower leg: No edema.     Left lower leg: No edema.  Lymphadenopathy:     Cervical: No cervical adenopathy.  Skin:    General: Skin is warm and dry.     Findings: No rash.  Neurological:     Mental Status: He is alert and oriented to person, place, and time.     Cranial Nerves: No cranial nerve deficit.     Motor: No abnormal muscle tone.     Coordination: Coordination normal.     Gait: Gait normal.     Deep Tendon Reflexes: Reflexes are normal and symmetric.  Psychiatric:        Behavior: Behavior normal.        Thought Content: Thought content normal.        Judgment: Judgment normal.   Post-op neck, mouth deformities  Lab Results  Component Value Date   WBC 4.7 02/04/2024   HGB 13.8 02/04/2024   HCT 40.6 02/04/2024   PLT 218.0 02/04/2024   GLUCOSE 98 02/04/2024   CHOL 199 02/02/2023   TRIG 171.0 (H) 02/02/2023   HDL 39.80 02/02/2023   LDLDIRECT 116.5 01/02/2011   LDLCALC 125 (H) 02/02/2023   ALT 7 02/04/2024   AST 15 02/04/2024   NA 139 02/04/2024   K 4.1 02/04/2024   CL 105 02/04/2024   CREATININE 0.81 02/04/2024   BUN 31 (H) 02/04/2024   CO2 27 02/04/2024   TSH 1.58 02/04/2024   PSA 0.00 (L) 02/02/2023   HGBA1C 4.7 (L) 12/03/2017    No results found.  Assessment & Plan:   Problem List Items Addressed This Visit     Bipolar depression (HCC)    Doing fair: Tongue cancer relapsed in Nov 2024 - s/p another surgery, on XRT and Chemo now.  On Lamictal , Zyprexa , Lithium  Discussed      Relevant Orders   Lithium  level (Completed)   Hypothyroidism - Primary   Put synthroid  in clear gel caps      Relevant Medications   levothyroxine  (SYNTHROID ) 50 MCG tablet   Other Relevant Orders   Comprehensive metabolic panel with GFR (Completed)   CBC with  Differential/Platelet (Completed)   T4, free (Completed)   TSH (Completed)   Increased ammonia level   It is likely a side effect of lithium  therapy. Check lithium  level, ammonia      Relevant Orders   CBC with Differential/Platelet (Completed)   Ammonia (Completed)   Lithium  level (Completed)   Loss of weight   Wt Readings from Last 3 Encounters:  02/04/24 158 lb (71.7 kg)  11/03/23 163 lb (73.9  kg)  08/05/23 170 lb (77.1 kg)         Relevant Orders   CBC with Differential/Platelet (Completed)   T4, free (Completed)   TSH (Completed)   Squamous cell cancer of tongue (HCC)   Relevant Orders   CBC with Differential/Platelet (Completed)      Meds ordered this encounter  Medications   levothyroxine  (SYNTHROID ) 50 MCG tablet    Sig: TAKE 1 AND 1/2 TABLETS(75 MCG) BY MOUTH DAILY BEFORE BREAKFAST    Dispense:  135 tablet    Refill:  3      Follow-up: Return in about 3 months (around 05/06/2024) for a follow-up visit.  Marolyn Noel, MD

## 2024-02-05 LAB — LITHIUM LEVEL: Lithium Lvl: 0.4 mmol/L — ABNORMAL LOW (ref 0.6–1.2)

## 2024-02-07 ENCOUNTER — Ambulatory Visit: Payer: Self-pay | Admitting: Internal Medicine

## 2024-02-22 NOTE — Assessment & Plan Note (Signed)
  Doing fair: Tongue cancer relapsed in Nov 2024 - s/p another surgery, on XRT and Chemo now.  On Lamictal , Zyprexa , Lithium  Discussed

## 2024-02-25 ENCOUNTER — Other Ambulatory Visit: Payer: Self-pay | Admitting: Internal Medicine

## 2024-05-09 ENCOUNTER — Ambulatory Visit: Admitting: Internal Medicine

## 2024-05-09 ENCOUNTER — Encounter: Payer: Self-pay | Admitting: Internal Medicine

## 2024-05-09 VITALS — BP 118/80 | HR 75 | Temp 98.2°F | Ht 72.0 in | Wt 157.6 lb

## 2024-05-09 DIAGNOSIS — Z23 Encounter for immunization: Secondary | ICD-10-CM

## 2024-05-09 DIAGNOSIS — E785 Hyperlipidemia, unspecified: Secondary | ICD-10-CM

## 2024-05-09 DIAGNOSIS — F319 Bipolar disorder, unspecified: Secondary | ICD-10-CM

## 2024-05-09 DIAGNOSIS — E039 Hypothyroidism, unspecified: Secondary | ICD-10-CM

## 2024-05-09 DIAGNOSIS — R634 Abnormal weight loss: Secondary | ICD-10-CM

## 2024-05-09 LAB — CBC WITH DIFFERENTIAL/PLATELET
Basophils Absolute: 0 K/uL (ref 0.0–0.1)
Basophils Relative: 1 % (ref 0.0–3.0)
Eosinophils Absolute: 0.2 K/uL (ref 0.0–0.7)
Eosinophils Relative: 5.2 % — ABNORMAL HIGH (ref 0.0–5.0)
HCT: 42.2 % (ref 39.0–52.0)
Hemoglobin: 14.3 g/dL (ref 13.0–17.0)
Lymphocytes Relative: 18.8 % (ref 12.0–46.0)
Lymphs Abs: 0.7 K/uL (ref 0.7–4.0)
MCHC: 33.9 g/dL (ref 30.0–36.0)
MCV: 96.8 fl (ref 78.0–100.0)
Monocytes Absolute: 0.3 K/uL (ref 0.1–1.0)
Monocytes Relative: 7.5 % (ref 3.0–12.0)
Neutro Abs: 2.5 K/uL (ref 1.4–7.7)
Neutrophils Relative %: 67.5 % (ref 43.0–77.0)
Platelets: 183 K/uL (ref 150.0–400.0)
RBC: 4.35 Mil/uL (ref 4.22–5.81)
RDW: 12.8 % (ref 11.5–15.5)
WBC: 3.7 K/uL — ABNORMAL LOW (ref 4.0–10.5)

## 2024-05-09 LAB — COMPREHENSIVE METABOLIC PANEL WITH GFR
ALT: 17 U/L (ref 0–53)
AST: 35 U/L (ref 0–37)
Albumin: 4.8 g/dL (ref 3.5–5.2)
Alkaline Phosphatase: 64 U/L (ref 39–117)
BUN: 16 mg/dL (ref 6–23)
CO2: 28 meq/L (ref 19–32)
Calcium: 9.7 mg/dL (ref 8.4–10.5)
Chloride: 104 meq/L (ref 96–112)
Creatinine, Ser: 0.9 mg/dL (ref 0.40–1.50)
GFR: 88.64 mL/min (ref >60.00)
Glucose, Bld: 88 mg/dL (ref 70–99)
Potassium: 3.7 meq/L (ref 3.5–5.1)
Sodium: 139 meq/L (ref 135–145)
Total Bilirubin: 0.5 mg/dL (ref 0.2–1.2)
Total Protein: 6.8 g/dL (ref 6.0–8.3)

## 2024-05-09 LAB — AMMONIA: Ammonia: 34 umol/L (ref 11–35)

## 2024-05-09 LAB — T4, FREE: Free T4: 0.63 ng/dL (ref 0.60–1.60)

## 2024-05-09 NOTE — Assessment & Plan Note (Signed)
 Resolved

## 2024-05-09 NOTE — Assessment & Plan Note (Signed)
 Putting synthroid  in clear gel caps Check labs

## 2024-05-09 NOTE — Progress Notes (Signed)
 Subjective:  Patient ID: Adam Keller, male    DOB: 01/18/1956  Age: 68 y.o. MRN: [id number]  CC: Medical Management of Chronic Issues (3 Month follow upv)   HPI Adam Keller presents for bipolar depression, hypothyroidism, oral cancer Wife broke her elbow  Outpatient Medications Prior to Visit  Medication Sig Dispense Refill   lamoTRIgine  (LAMICTAL ) 200 MG tablet Take 1 tablet (200 mg total) by mouth at bedtime. 30 tablet 1   levothyroxine  (SYNTHROID ) 50 MCG tablet TAKE 1 AND 1/2 TABLETS(75 MCG) BY MOUTH DAILY BEFORE BREAKFAST 135 tablet 3   lithium  carbonate 600 MG capsule Take 1 capsule (600 mg total) by mouth 2 (two) times daily with a meal. 60 capsule 1   OLANZapine  (ZYPREXA ) 7.5 MG tablet Take 7.5 mg by mouth in the morning and at bedtime.     montelukast  (SINGULAIR ) 10 MG tablet TAKE 1 TABLET(10 MG) BY MOUTH DAILY (Patient not taking: Reported on 05/09/2024) 90 tablet 3   omeprazole  (PRILOSEC) 40 MG capsule Take 1 capsule (40 mg total) by mouth daily as needed. (Patient not taking: Reported on 05/09/2024) 30 capsule 2   sildenafil (VIAGRA) 100 MG tablet SMARTSIG:1 Tablet(s) By Mouth As Directed PRN (Patient not taking: Reported on 05/09/2024)     No facility-administered medications prior to visit.    ROS: Review of Systems  Constitutional:  Positive for fatigue. Negative for appetite change and unexpected weight change.  HENT:  Positive for drooling. Negative for congestion, nosebleeds, sneezing, sore throat, trouble swallowing and voice change.   Eyes:  Negative for itching and visual disturbance.  Respiratory:  Negative for cough and wheezing.   Cardiovascular:  Negative for chest pain, palpitations and leg swelling.  Gastrointestinal:  Negative for abdominal distention, blood in stool, diarrhea and nausea.  Genitourinary:  Negative for frequency and hematuria.  Musculoskeletal:  Negative for back pain, gait problem, joint swelling and neck pain.  Skin:  Negative for  rash.  Neurological:  Positive for weakness. Negative for dizziness, tremors and speech difficulty.  Psychiatric/Behavioral:  Negative for agitation, dysphoric mood and sleep disturbance. The patient is not nervous/anxious.     Objective:  BP 118/80   Pulse 75   Temp 98.2 F (36.8 C)   Ht 6' (1.829 m)   Wt 157 lb 9.6 oz (71.5 kg)   SpO2 99%   BMI 21.37 kg/m   BP Readings from Last 3 Encounters:  05/09/24 118/80  02/04/24 120/80  11/03/23 116/70    Wt Readings from Last 3 Encounters:  05/09/24 157 lb 9.6 oz (71.5 kg)  02/04/24 158 lb (71.7 kg)  11/03/23 163 lb (73.9 kg)    Physical Exam Constitutional:      General: He is not in acute distress.    Appearance: He is well-developed. He is obese.     Comments: NAD  Eyes:     Conjunctiva/sclera: Conjunctivae normal.     Pupils: Pupils are equal, round, and reactive to light.  Neck:     Thyroid : No thyromegaly.     Vascular: No JVD.  Cardiovascular:     Rate and Rhythm: Normal rate and regular rhythm.     Heart sounds: Normal heart sounds. No murmur heard.    No friction rub. No gallop.  Pulmonary:     Effort: Pulmonary effort is normal. No respiratory distress.     Breath sounds: Normal breath sounds. No wheezing or rales.  Chest:     Chest wall: No tenderness.  Abdominal:  General: Bowel sounds are normal. There is no distension.     Palpations: Abdomen is soft. There is no mass.     Tenderness: There is no abdominal tenderness. There is no guarding or rebound.  Musculoskeletal:        General: No tenderness. Normal range of motion.     Cervical back: Normal range of motion.     Right lower leg: No edema.     Left lower leg: No edema.  Lymphadenopathy:     Cervical: No cervical adenopathy.  Skin:    General: Skin is warm and dry.     Findings: No rash.  Neurological:     Mental Status: He is alert and oriented to person, place, and time.     Cranial Nerves: No cranial nerve deficit.     Motor: No  abnormal muscle tone.     Coordination: Coordination normal.     Gait: Gait normal.     Deep Tendon Reflexes: Reflexes are normal and symmetric.  Psychiatric:        Behavior: Behavior normal.        Thought Content: Thought content normal.        Judgment: Judgment normal.   L tongue bite ulcer  Lab Results  Component Value Date   WBC 4.7 02/04/2024   HGB 13.8 02/04/2024   HCT 40.6 02/04/2024   PLT 218.0 02/04/2024   GLUCOSE 98 02/04/2024   CHOL 199 02/02/2023   TRIG 171.0 (H) 02/02/2023   HDL 39.80 02/02/2023   LDLDIRECT 116.5 01/02/2011   LDLCALC 125 (H) 02/02/2023   ALT 7 02/04/2024   AST 15 02/04/2024   NA 139 02/04/2024   K 4.1 02/04/2024   CL 105 02/04/2024   CREATININE 0.81 02/04/2024   BUN 31 (H) 02/04/2024   CO2 27 02/04/2024   TSH 1.58 02/04/2024   PSA 0.00 (L) 02/02/2023   HGBA1C 4.7 (L) 12/03/2017    No results found.  Assessment & Plan:   Problem List Items Addressed This Visit     Bipolar depression (HCC) - Primary   Relevant Orders   Ammonia   Lithium  level   Dyslipidemia   Relevant Orders   T4, free   Comprehensive metabolic panel with GFR   CBC with Differential/Platelet   Loss of weight   Relevant Orders   T4, free   Comprehensive metabolic panel with GFR   CBC with Differential/Platelet      No orders of the defined types were placed in this encounter.     Follow-up: Return in about 3 months (around 08/09/2024) for a follow-up visit.  Marolyn Noel, MD

## 2024-05-09 NOTE — Addendum Note (Signed)
 Addended byBETHA LUCETTA CLEATRICE LELON on: 05/09/2024 03:00 PM   Modules accepted: Orders

## 2024-05-09 NOTE — Assessment & Plan Note (Signed)
 Check ammonia, lithium  Doing well

## 2024-05-09 NOTE — Patient Instructions (Signed)
Use [employer] Peroxicare tooth paste  

## 2024-05-10 ENCOUNTER — Ambulatory Visit: Payer: Self-pay | Admitting: Internal Medicine

## 2024-05-10 LAB — LITHIUM LEVEL: Lithium Lvl: 0.4 mmol/L — ABNORMAL LOW (ref 0.6–1.2)

## 2024-08-10 ENCOUNTER — Ambulatory Visit: Admitting: Internal Medicine

## 2024-08-10 ENCOUNTER — Encounter: Payer: Self-pay | Admitting: Internal Medicine

## 2024-08-10 VITALS — BP 120/82 | HR 77 | Temp 98.3°F | Ht 72.0 in | Wt 160.0 lb

## 2024-08-10 DIAGNOSIS — C029 Malignant neoplasm of tongue, unspecified: Secondary | ICD-10-CM | POA: Diagnosis not present

## 2024-08-10 DIAGNOSIS — F319 Bipolar disorder, unspecified: Secondary | ICD-10-CM | POA: Diagnosis not present

## 2024-08-10 DIAGNOSIS — R7989 Other specified abnormal findings of blood chemistry: Secondary | ICD-10-CM | POA: Diagnosis not present

## 2024-08-10 DIAGNOSIS — E039 Hypothyroidism, unspecified: Secondary | ICD-10-CM | POA: Diagnosis not present

## 2024-08-10 LAB — COMPREHENSIVE METABOLIC PANEL WITH GFR
ALT: 9 U/L (ref 3–53)
AST: 21 U/L (ref 5–37)
Albumin: 4.8 g/dL (ref 3.5–5.2)
Alkaline Phosphatase: 60 U/L (ref 39–117)
BUN: 20 mg/dL (ref 6–23)
CO2: 28 meq/L (ref 19–32)
Calcium: 9.7 mg/dL (ref 8.4–10.5)
Chloride: 108 meq/L (ref 96–112)
Creatinine, Ser: 1.02 mg/dL (ref 0.40–1.50)
GFR: 76.15 mL/min
Glucose, Bld: 89 mg/dL (ref 70–99)
Potassium: 4 meq/L (ref 3.5–5.1)
Sodium: 140 meq/L (ref 135–145)
Total Bilirubin: 0.4 mg/dL (ref 0.2–1.2)
Total Protein: 7 g/dL (ref 6.0–8.3)

## 2024-08-10 LAB — CBC WITH DIFFERENTIAL/PLATELET
Basophils Absolute: 0 10*3/uL (ref 0.0–0.1)
Basophils Relative: 0.9 % (ref 0.0–3.0)
Eosinophils Absolute: 0.2 10*3/uL (ref 0.0–0.7)
Eosinophils Relative: 4.7 % (ref 0.0–5.0)
HCT: 40.4 % (ref 39.0–52.0)
Hemoglobin: 14 g/dL (ref 13.0–17.0)
Lymphocytes Relative: 16.5 % (ref 12.0–46.0)
Lymphs Abs: 0.6 10*3/uL — ABNORMAL LOW (ref 0.7–4.0)
MCHC: 34.6 g/dL (ref 30.0–36.0)
MCV: 96 fl (ref 78.0–100.0)
Monocytes Absolute: 0.3 10*3/uL (ref 0.1–1.0)
Monocytes Relative: 9.2 % (ref 3.0–12.0)
Neutro Abs: 2.6 10*3/uL (ref 1.4–7.7)
Neutrophils Relative %: 68.7 % (ref 43.0–77.0)
Platelets: 166 10*3/uL (ref 150.0–400.0)
RBC: 4.21 Mil/uL — ABNORMAL LOW (ref 4.22–5.81)
RDW: 12.4 % (ref 11.5–15.5)
WBC: 3.7 10*3/uL — ABNORMAL LOW (ref 4.0–10.5)

## 2024-08-10 LAB — AMMONIA: Ammonia: 32 umol/L (ref 11–35)

## 2024-08-10 NOTE — Assessment & Plan Note (Signed)
Check ammonia. 

## 2024-08-10 NOTE — Progress Notes (Signed)
 "  Subjective:  Patient ID: Adam Keller, male    DOB: [birth year]  Age: 69 y.o. MRN: [id number]  CC: Follow-up   HPI Adam Keller presents for increased ammonia, tongue cancer, hypothyroidism  Outpatient Medications Prior to Visit  Medication Sig Dispense Refill   lamoTRIgine  (LAMICTAL ) 200 MG tablet Take 1 tablet (200 mg total) by mouth at bedtime. 30 tablet 1   levothyroxine  (SYNTHROID ) 50 MCG tablet TAKE 1 AND 1/2 TABLETS(75 MCG) BY MOUTH DAILY BEFORE BREAKFAST 135 tablet 3   lithium  carbonate 600 MG capsule Take 1 capsule (600 mg total) by mouth 2 (two) times daily with a meal. 60 capsule 1   montelukast  (SINGULAIR ) 10 MG tablet TAKE 1 TABLET(10 MG) BY MOUTH DAILY 90 tablet 3   OLANZapine  (ZYPREXA ) 7.5 MG tablet Take 7.5 mg by mouth in the morning and at bedtime.     omeprazole  (PRILOSEC) 40 MG capsule Take 1 capsule (40 mg total) by mouth daily as needed. 30 capsule 2   sildenafil (VIAGRA) 100 MG tablet SMARTSIG:1 Tablet(s) By Mouth As Directed PRN     No facility-administered medications prior to visit.    ROS: Review of Systems  Constitutional:  Negative for appetite change, fatigue and unexpected weight change.  HENT:  Negative for congestion, nosebleeds, sneezing, sore throat and trouble swallowing.   Eyes:  Negative for itching and visual disturbance.  Respiratory:  Negative for cough.   Cardiovascular:  Negative for chest pain, palpitations and leg swelling.  Gastrointestinal:  Negative for abdominal distention, blood in stool, diarrhea and nausea.  Genitourinary:  Negative for frequency and hematuria.  Musculoskeletal:  Negative for back pain, gait problem, joint swelling and neck pain.  Skin:  Negative for rash.  Neurological:  Negative for dizziness, tremors, speech difficulty and weakness.  Psychiatric/Behavioral:  Negative for agitation, dysphoric mood, sleep disturbance and suicidal ideas. The patient is not nervous/anxious.     Objective:  BP 120/82   Pulse  77   Temp 98.3 F (36.8 C) (Oral)   Ht 6' (1.829 m)   Wt 160 lb (72.6 kg)   SpO2 98%   BMI 21.70 kg/m   BP Readings from Last 3 Encounters:  08/10/24 120/82  05/09/24 118/80  02/04/24 120/80    Wt Readings from Last 3 Encounters:  08/10/24 160 lb (72.6 kg)  05/09/24 157 lb 9.6 oz (71.5 kg)  02/04/24 158 lb (71.7 kg)    Physical Exam Constitutional:      General: He is not in acute distress.    Appearance: He is well-developed.     Comments: NAD  Eyes:     Conjunctiva/sclera: Conjunctivae normal.     Pupils: Pupils are equal, round, and reactive to light.  Neck:     Thyroid : No thyromegaly.     Vascular: No JVD.  Cardiovascular:     Rate and Rhythm: Normal rate and regular rhythm.     Heart sounds: Normal heart sounds. No murmur heard.    No friction rub. No gallop.  Pulmonary:     Effort: Pulmonary effort is normal. No respiratory distress.     Breath sounds: Normal breath sounds. No wheezing or rales.  Chest:     Chest wall: No tenderness.  Abdominal:     General: Bowel sounds are normal. There is no distension.     Palpations: Abdomen is soft. There is no mass.     Tenderness: There is no abdominal tenderness. There is no guarding or rebound.  Musculoskeletal:  General: No tenderness. Normal range of motion.     Cervical back: Normal range of motion.  Lymphadenopathy:     Cervical: No cervical adenopathy.  Skin:    General: Skin is warm and dry.     Findings: No rash.  Neurological:     Mental Status: He is alert and oriented to person, place, and time.     Cranial Nerves: No cranial nerve deficit.     Motor: No abnormal muscle tone.     Coordination: Coordination normal.     Gait: Gait normal.     Deep Tendon Reflexes: Reflexes are normal and symmetric.  Psychiatric:        Behavior: Behavior normal.        Thought Content: Thought content normal.        Judgment: Judgment normal.     Lab Results  Component Value Date   WBC 3.7 (L)  08/10/2024   HGB 14.0 08/10/2024   HCT 40.4 08/10/2024   PLT 166.0 08/10/2024   GLUCOSE 89 08/10/2024   CHOL 199 02/02/2023   TRIG 171.0 (H) 02/02/2023   HDL 39.80 02/02/2023   LDLDIRECT 116.5 01/02/2011   LDLCALC 125 (H) 02/02/2023   ALT 9 08/10/2024   AST 21 08/10/2024   NA 140 08/10/2024   K 4.0 08/10/2024   CL 108 08/10/2024   CREATININE 1.02 08/10/2024   BUN 20 08/10/2024   CO2 28 08/10/2024   TSH 2.23 08/10/2024   PSA 0.00 (L) 02/02/2023   HGBA1C 4.7 (L) 12/03/2017    No results found.  Assessment & Plan:   Problem List Items Addressed This Visit     Hypothyroidism - Primary   Putting synthroid  in clear gel caps Check labs      Relevant Orders   CBC with Differential/Platelet (Completed)   Comprehensive metabolic panel with GFR (Completed)   T4, free (Completed)   TSH (Completed)   Ammonia (Completed)   Bipolar depression (HCC)   Check ammonia, lithium  Doing well      Relevant Orders   CBC with Differential/Platelet (Completed)   Increased ammonia level   Check ammonia      Relevant Orders   Ammonia (Completed)   Squamous cell cancer of tongue (HCC)   PET scan was ok Hypersalivation      Relevant Orders   Comprehensive metabolic panel with GFR (Completed)   T4, free (Completed)   TSH (Completed)      No orders of the defined types were placed in this encounter.     Follow-up: Return in about 3 months (around 11/08/2024) for a follow-up visit.  Marolyn Noel, MD "

## 2024-08-10 NOTE — Assessment & Plan Note (Signed)
 Check ammonia, lithium  Doing well

## 2024-08-10 NOTE — Assessment & Plan Note (Signed)
 PET scan was ok Hypersalivation

## 2024-08-10 NOTE — Assessment & Plan Note (Signed)
 Putting synthroid  in clear gel caps Check labs

## 2024-08-11 LAB — T4, FREE: Free T4: 0.58 ng/dL — ABNORMAL LOW (ref 0.60–1.60)

## 2024-08-11 LAB — TSH: TSH: 2.23 u[IU]/mL (ref 0.35–5.50)

## 2024-08-15 ENCOUNTER — Ambulatory Visit: Payer: Self-pay | Admitting: Internal Medicine

## 2024-08-15 DIAGNOSIS — E039 Hypothyroidism, unspecified: Secondary | ICD-10-CM

## 2024-08-15 DIAGNOSIS — R739 Hyperglycemia, unspecified: Secondary | ICD-10-CM

## 2024-11-08 ENCOUNTER — Ambulatory Visit: Admitting: Internal Medicine
